# Patient Record
Sex: Female | Born: 1945 | Race: Black or African American | Hispanic: No | Smoking: Former smoker
Health system: Southern US, Community
[De-identification: ages and names within clinical notes are randomized; demographics above are authoritative.]

## PROBLEM LIST (undated history)

## (undated) DIAGNOSIS — R51 Headache: Secondary | ICD-10-CM

## (undated) DIAGNOSIS — N183 Chronic kidney disease, stage 3 unspecified: Secondary | ICD-10-CM

## (undated) DIAGNOSIS — M79671 Pain in right foot: Secondary | ICD-10-CM

## (undated) DIAGNOSIS — M1990 Unspecified osteoarthritis, unspecified site: Secondary | ICD-10-CM

## (undated) DIAGNOSIS — R0602 Shortness of breath: Secondary | ICD-10-CM

## (undated) DIAGNOSIS — Z87442 Personal history of urinary calculi: Secondary | ICD-10-CM

## (undated) DIAGNOSIS — IMO0002 Reserved for concepts with insufficient information to code with codable children: Secondary | ICD-10-CM

## (undated) DIAGNOSIS — I10 Essential (primary) hypertension: Secondary | ICD-10-CM

## (undated) DIAGNOSIS — I340 Nonrheumatic mitral (valve) insufficiency: Secondary | ICD-10-CM

## (undated) DIAGNOSIS — G4730 Sleep apnea, unspecified: Secondary | ICD-10-CM

## (undated) DIAGNOSIS — T8859XA Other complications of anesthesia, initial encounter: Secondary | ICD-10-CM

## (undated) DIAGNOSIS — K219 Gastro-esophageal reflux disease without esophagitis: Secondary | ICD-10-CM

## (undated) DIAGNOSIS — I639 Cerebral infarction, unspecified: Secondary | ICD-10-CM

## (undated) DIAGNOSIS — N390 Urinary tract infection, site not specified: Secondary | ICD-10-CM

## (undated) DIAGNOSIS — E663 Overweight: Secondary | ICD-10-CM

## (undated) DIAGNOSIS — E785 Hyperlipidemia, unspecified: Secondary | ICD-10-CM

## (undated) DIAGNOSIS — G501 Atypical facial pain: Secondary | ICD-10-CM

## (undated) DIAGNOSIS — I2510 Atherosclerotic heart disease of native coronary artery without angina pectoris: Secondary | ICD-10-CM

## (undated) DIAGNOSIS — T4145XA Adverse effect of unspecified anesthetic, initial encounter: Secondary | ICD-10-CM

## (undated) DIAGNOSIS — I739 Peripheral vascular disease, unspecified: Secondary | ICD-10-CM

## (undated) DIAGNOSIS — R9430 Abnormal result of cardiovascular function study, unspecified: Secondary | ICD-10-CM

## (undated) HISTORY — DX: Unspecified osteoarthritis, unspecified site: M19.90

## (undated) HISTORY — DX: Reserved for concepts with insufficient information to code with codable children: IMO0002

## (undated) HISTORY — DX: Atherosclerotic heart disease of native coronary artery without angina pectoris: I25.10

## (undated) HISTORY — DX: Atypical facial pain: G50.1

## (undated) HISTORY — DX: Headache: R51

## (undated) HISTORY — DX: Cerebral infarction, unspecified: I63.9

## (undated) HISTORY — DX: Nonrheumatic mitral (valve) insufficiency: I34.0

## (undated) HISTORY — DX: Sleep apnea, unspecified: G47.30

## (undated) HISTORY — DX: Essential (primary) hypertension: I10

## (undated) HISTORY — DX: Gastro-esophageal reflux disease without esophagitis: K21.9

## (undated) HISTORY — DX: Shortness of breath: R06.02

## (undated) HISTORY — PX: ABDOMINAL HYSTERECTOMY: SHX81

## (undated) HISTORY — DX: Abnormal result of cardiovascular function study, unspecified: R94.30

## (undated) HISTORY — DX: Overweight: E66.3

## (undated) HISTORY — PX: VESICOVAGINAL FISTULA CLOSURE W/ TAH: SUR271

## (undated) HISTORY — DX: Hyperlipidemia, unspecified: E78.5

## (undated) HISTORY — DX: Pain in right foot: M79.671

## (undated) HISTORY — DX: Peripheral vascular disease, unspecified: I73.9

---

## 1999-03-23 ENCOUNTER — Emergency Department (HOSPITAL_COMMUNITY): Admission: EM | Admit: 1999-03-23 | Discharge: 1999-03-23 | Payer: Self-pay | Admitting: Emergency Medicine

## 1999-09-23 ENCOUNTER — Encounter: Admission: RE | Admit: 1999-09-23 | Discharge: 1999-09-23 | Payer: Self-pay | Admitting: Family Medicine

## 1999-09-23 ENCOUNTER — Encounter: Payer: Self-pay | Admitting: Family Medicine

## 1999-10-05 ENCOUNTER — Encounter: Admission: RE | Admit: 1999-10-05 | Discharge: 1999-10-05 | Payer: Self-pay | Admitting: Family Medicine

## 1999-10-05 ENCOUNTER — Encounter: Payer: Self-pay | Admitting: Family Medicine

## 1999-10-17 ENCOUNTER — Emergency Department (HOSPITAL_COMMUNITY): Admission: EM | Admit: 1999-10-17 | Discharge: 1999-10-17 | Payer: Self-pay | Admitting: Emergency Medicine

## 1999-10-17 ENCOUNTER — Encounter: Payer: Self-pay | Admitting: Emergency Medicine

## 1999-10-19 ENCOUNTER — Encounter (INDEPENDENT_AMBULATORY_CARE_PROVIDER_SITE_OTHER): Payer: Self-pay | Admitting: *Deleted

## 1999-10-19 ENCOUNTER — Inpatient Hospital Stay (HOSPITAL_COMMUNITY): Admission: AD | Admit: 1999-10-19 | Discharge: 1999-10-21 | Payer: Self-pay | Admitting: Family Medicine

## 2000-11-17 ENCOUNTER — Encounter: Admission: RE | Admit: 2000-11-17 | Discharge: 2000-11-17 | Payer: Self-pay | Admitting: Family Medicine

## 2000-11-17 ENCOUNTER — Encounter: Payer: Self-pay | Admitting: Family Medicine

## 2002-02-08 ENCOUNTER — Emergency Department (HOSPITAL_COMMUNITY): Admission: EM | Admit: 2002-02-08 | Discharge: 2002-02-08 | Payer: Self-pay | Admitting: Emergency Medicine

## 2002-02-21 ENCOUNTER — Emergency Department (HOSPITAL_COMMUNITY): Admission: EM | Admit: 2002-02-21 | Discharge: 2002-02-21 | Payer: Self-pay | Admitting: Emergency Medicine

## 2002-02-21 ENCOUNTER — Encounter: Payer: Self-pay | Admitting: Emergency Medicine

## 2003-02-16 ENCOUNTER — Encounter: Payer: Self-pay | Admitting: Emergency Medicine

## 2003-02-16 ENCOUNTER — Inpatient Hospital Stay (HOSPITAL_COMMUNITY): Admission: EM | Admit: 2003-02-16 | Discharge: 2003-02-19 | Payer: Self-pay | Admitting: Emergency Medicine

## 2003-02-17 ENCOUNTER — Encounter: Payer: Self-pay | Admitting: Family Medicine

## 2003-02-18 ENCOUNTER — Encounter: Payer: Self-pay | Admitting: Cardiology

## 2003-02-19 ENCOUNTER — Encounter: Payer: Self-pay | Admitting: Cardiology

## 2003-02-22 ENCOUNTER — Encounter: Admission: RE | Admit: 2003-02-22 | Discharge: 2003-02-22 | Payer: Self-pay | Admitting: Family Medicine

## 2003-06-21 ENCOUNTER — Emergency Department (HOSPITAL_COMMUNITY): Admission: EM | Admit: 2003-06-21 | Discharge: 2003-06-21 | Payer: Self-pay | Admitting: *Deleted

## 2003-06-24 ENCOUNTER — Encounter: Admission: RE | Admit: 2003-06-24 | Discharge: 2003-06-24 | Payer: Self-pay | Admitting: Family Medicine

## 2004-01-06 ENCOUNTER — Inpatient Hospital Stay (HOSPITAL_COMMUNITY): Admission: EM | Admit: 2004-01-06 | Discharge: 2004-01-11 | Payer: Self-pay | Admitting: Family Medicine

## 2004-01-07 ENCOUNTER — Encounter: Payer: Self-pay | Admitting: Cardiology

## 2004-02-21 ENCOUNTER — Ambulatory Visit (HOSPITAL_BASED_OUTPATIENT_CLINIC_OR_DEPARTMENT_OTHER): Admission: RE | Admit: 2004-02-21 | Discharge: 2004-02-21 | Payer: Self-pay | Admitting: Cardiology

## 2004-04-01 ENCOUNTER — Inpatient Hospital Stay (HOSPITAL_COMMUNITY): Admission: EM | Admit: 2004-04-01 | Discharge: 2004-04-03 | Payer: Self-pay | Admitting: Emergency Medicine

## 2005-03-03 ENCOUNTER — Ambulatory Visit: Payer: Self-pay | Admitting: Cardiology

## 2005-03-03 ENCOUNTER — Observation Stay (HOSPITAL_COMMUNITY): Admission: EM | Admit: 2005-03-03 | Discharge: 2005-03-06 | Payer: Self-pay | Admitting: Emergency Medicine

## 2005-04-09 ENCOUNTER — Ambulatory Visit: Payer: Self-pay | Admitting: Cardiology

## 2005-10-26 ENCOUNTER — Encounter: Admission: RE | Admit: 2005-10-26 | Discharge: 2005-10-26 | Payer: Self-pay | Admitting: Family Medicine

## 2005-12-10 ENCOUNTER — Ambulatory Visit: Payer: Self-pay | Admitting: Cardiology

## 2005-12-23 ENCOUNTER — Ambulatory Visit: Payer: Self-pay | Admitting: Cardiology

## 2005-12-23 ENCOUNTER — Ambulatory Visit: Payer: Self-pay

## 2006-01-14 ENCOUNTER — Inpatient Hospital Stay (HOSPITAL_COMMUNITY): Admission: EM | Admit: 2006-01-14 | Discharge: 2006-01-17 | Payer: Self-pay | Admitting: Emergency Medicine

## 2006-01-14 ENCOUNTER — Ambulatory Visit: Payer: Self-pay | Admitting: Cardiology

## 2007-03-01 ENCOUNTER — Ambulatory Visit: Payer: Self-pay | Admitting: Vascular Surgery

## 2007-03-24 ENCOUNTER — Ambulatory Visit (HOSPITAL_COMMUNITY): Admission: RE | Admit: 2007-03-24 | Discharge: 2007-03-24 | Payer: Self-pay | Admitting: Vascular Surgery

## 2007-03-24 ENCOUNTER — Ambulatory Visit: Payer: Self-pay | Admitting: Vascular Surgery

## 2007-04-12 ENCOUNTER — Ambulatory Visit: Payer: Self-pay | Admitting: Vascular Surgery

## 2007-06-09 ENCOUNTER — Encounter: Admission: RE | Admit: 2007-06-09 | Discharge: 2007-06-09 | Payer: Self-pay | Admitting: Occupational Medicine

## 2007-07-12 ENCOUNTER — Ambulatory Visit: Payer: Self-pay | Admitting: Vascular Surgery

## 2008-08-21 ENCOUNTER — Ambulatory Visit: Payer: Self-pay | Admitting: Vascular Surgery

## 2008-11-20 ENCOUNTER — Ambulatory Visit: Payer: Self-pay | Admitting: Vascular Surgery

## 2009-02-05 ENCOUNTER — Ambulatory Visit: Payer: Self-pay | Admitting: Vascular Surgery

## 2009-05-06 ENCOUNTER — Encounter: Payer: Self-pay | Admitting: Cardiology

## 2009-05-06 DIAGNOSIS — E663 Overweight: Secondary | ICD-10-CM

## 2009-05-06 DIAGNOSIS — E119 Type 2 diabetes mellitus without complications: Secondary | ICD-10-CM

## 2009-05-06 DIAGNOSIS — I10 Essential (primary) hypertension: Secondary | ICD-10-CM | POA: Insufficient documentation

## 2009-05-06 DIAGNOSIS — E785 Hyperlipidemia, unspecified: Secondary | ICD-10-CM | POA: Insufficient documentation

## 2009-05-07 ENCOUNTER — Ambulatory Visit: Payer: Self-pay | Admitting: Cardiology

## 2009-05-07 DIAGNOSIS — R0602 Shortness of breath: Secondary | ICD-10-CM | POA: Insufficient documentation

## 2009-05-16 ENCOUNTER — Ambulatory Visit: Payer: Self-pay

## 2009-05-16 ENCOUNTER — Ambulatory Visit: Payer: Self-pay | Admitting: Internal Medicine

## 2009-05-16 ENCOUNTER — Ambulatory Visit (HOSPITAL_COMMUNITY): Admission: RE | Admit: 2009-05-16 | Discharge: 2009-05-16 | Payer: Self-pay | Admitting: Internal Medicine

## 2009-05-16 ENCOUNTER — Encounter: Payer: Self-pay | Admitting: Cardiology

## 2009-05-19 ENCOUNTER — Ambulatory Visit: Payer: Self-pay | Admitting: Cardiology

## 2009-05-27 ENCOUNTER — Ambulatory Visit: Payer: Self-pay | Admitting: Cardiology

## 2009-05-29 LAB — CONVERTED CEMR LAB
BUN: 16 mg/dL (ref 6–23)
CO2: 31 meq/L (ref 19–32)
Chloride: 100 meq/L (ref 96–112)
Creatinine, Ser: 0.8 mg/dL (ref 0.4–1.2)
Glucose, Bld: 86 mg/dL (ref 70–99)
Potassium: 3.3 meq/L — ABNORMAL LOW (ref 3.5–5.1)

## 2009-06-04 ENCOUNTER — Ambulatory Visit: Payer: Self-pay | Admitting: Cardiology

## 2009-06-04 DIAGNOSIS — E8770 Fluid overload, unspecified: Secondary | ICD-10-CM | POA: Insufficient documentation

## 2009-06-04 DIAGNOSIS — E876 Hypokalemia: Secondary | ICD-10-CM

## 2009-07-08 ENCOUNTER — Ambulatory Visit: Payer: Self-pay | Admitting: Cardiology

## 2009-07-21 ENCOUNTER — Telehealth (INDEPENDENT_AMBULATORY_CARE_PROVIDER_SITE_OTHER): Payer: Self-pay | Admitting: *Deleted

## 2009-07-22 ENCOUNTER — Encounter (HOSPITAL_COMMUNITY): Admission: RE | Admit: 2009-07-22 | Discharge: 2009-09-29 | Payer: Self-pay | Admitting: Cardiology

## 2009-07-22 ENCOUNTER — Ambulatory Visit: Payer: Self-pay

## 2009-07-22 ENCOUNTER — Ambulatory Visit: Payer: Self-pay | Admitting: Cardiovascular Disease

## 2009-07-30 ENCOUNTER — Ambulatory Visit: Payer: Self-pay | Admitting: Cardiology

## 2009-10-22 ENCOUNTER — Ambulatory Visit: Payer: Self-pay | Admitting: Vascular Surgery

## 2009-10-28 ENCOUNTER — Ambulatory Visit (HOSPITAL_COMMUNITY): Admission: RE | Admit: 2009-10-28 | Discharge: 2009-10-28 | Payer: Self-pay | Admitting: Obstetrics & Gynecology

## 2009-12-10 ENCOUNTER — Encounter (INDEPENDENT_AMBULATORY_CARE_PROVIDER_SITE_OTHER): Payer: Self-pay | Admitting: *Deleted

## 2010-02-04 ENCOUNTER — Ambulatory Visit: Payer: Self-pay | Admitting: Cardiology

## 2010-04-23 ENCOUNTER — Ambulatory Visit: Payer: Self-pay | Admitting: Vascular Surgery

## 2010-08-25 NOTE — Assessment & Plan Note (Signed)
Summary: 3wk f.u myoview 07-22-09   Visit Type:  Follow-up Referring Provider:  Fabienne Bruns, MD Primary Provider:  Abbe Amsterdam  CC:  shortness of breath.  History of Present Illness: The patient is seen for follow up of shortness of breath.  I saw her last July 08, 2009.  An exercise nuclear scan was scheduled at that time.  Patient is feeling better in general.  Current Medications (verified): 1)  Actoplus Met 15-850 Mg Tabs (Pioglitazone Hcl-Metformin Hcl) .... Two Times A Day 2)  Lipitor 40 Mg Tabs (Atorvastatin Calcium) .... Take One Tablet By Mouth Daily. 3)  Metoprolol Tartrate 100 Mg Tabs (Metoprolol Tartrate) .... Take One Tablet By Mouth Twice A Day 4)  Aspirin Ec 325 Mg Tbec (Aspirin) .... Take One Tablet By Mouth Daily 5)  Lisinopril 40 Mg Tabs (Lisinopril) .... Take One Tablet By Mouth Daily 6)  Nitrostat 0.4 Mg Subl (Nitroglycerin) .Marland Kitchen.. 1 Tablet Under Tongue At Onset of Chest Pain; You May Repeat Every 5 Minutes For Up To 3 Doses. 7)  Citalopram Hydrobromide 20 Mg Tabs (Citalopram Hydrobromide) .... Take One Tablet By Mouth Once Daily. 8)  Meloxicam 15 Mg Tabs (Meloxicam) .... Take One Tablet By Mouth Once Daily. 9)  Furosemide 40 Mg Tabs (Furosemide) .... Take One Tablet By Mouth Two Times A Day 10)  Potassium Chloride Crys Cr 20 Meq Cr-Tabs (Potassium Chloride Crys Cr) .... Take 2 Tabs By Mouth Daily  Allergies (verified): No Known Drug Allergies  Review of Systems       The patient denies fever, chills, headache, sweats, rash, change in vision, change in hearing, nausea vomiting, urinary symptoms.  All the systems are reviewed and are negative.  Vital Signs:  Patient profile:   65 year old female Height:      62.5 inches Weight:      226 pounds BMI:     40.82 Pulse rate:   75 / minute BP sitting:   142 / 90  (left arm) Cuff size:   large  Vitals Entered By: Hardin Negus, RMA (July 30, 2009 9:07 AM)  Physical Exam  General:  patient is  stable today. Eyes:  no xanthelasma. Neck:  no jugular venous distention. Lungs:  lungs are clear.  Respiratory effort is nonlabored. Heart:  cardiac exam reveals S1 and S2.  No clicks or significant murmurs. Abdomen:  abdomen is soft. Extremities:  no peripheral edema. Psych:  patient is oriented to person time and place.  Affect is normal.   Impression & Recommendations:  Problem # 1:  FLUID OVERLOAD (ICD-276.6) Volume status is stable.  No change.  Problem # 2:  SHORTNESS OF BREATH (ICD-786.05)  Her updated medication list for this problem includes:    Metoprolol Tartrate 100 Mg Tabs (Metoprolol tartrate) .Marland Kitchen... Take one tablet by mouth twice a day    Aspirin Ec 325 Mg Tbec (Aspirin) .Marland Kitchen... Take one tablet by mouth daily    Lisinopril 40 Mg Tabs (Lisinopril) .Marland Kitchen... Take one tablet by mouth daily    Furosemide 40 Mg Tabs (Furosemide) .Marland Kitchen... Take one tablet by mouth two times a day shortness of breath is stable.  She does not have ischemia.  No further workup.  Problem # 3:  CAD (ICD-414.00)  Her updated medication list for this problem includes:    Metoprolol Tartrate 100 Mg Tabs (Metoprolol tartrate) .Marland Kitchen... Take one tablet by mouth twice a day    Aspirin Ec 325 Mg Tbec (Aspirin) .Marland Kitchen... Take one tablet by mouth  daily    Lisinopril 40 Mg Tabs (Lisinopril) .Marland Kitchen... Take one tablet by mouth daily    Nitrostat 0.4 Mg Subl (Nitroglycerin) .Marland Kitchen... 1 tablet under tongue at onset of chest pain; you may repeat every 5 minutes for up to 3 doses. Coronary disease is stable.  No further workup.  Nuclear scan was done.  This study shows no ischemia I have reviewed completely and ejection fraction was 70%.  We'll see her back in 6 months for followup.  Patient Instructions: 1)  Follow up in 6 months

## 2010-08-25 NOTE — Letter (Signed)
Summary: Appointment - Reminder 2  [facility], Main Office  [address]   [place], [place] [address]   Phone: [phone]  Fax: [phone]     Dec 10, 2009 MRN: [id number]   [facility] [address] [place], [place]  [address]   Dear [name],  Our records indicate that it is time to schedule a follow-up appointment with Dr.Katz. It is very important that we reach you to schedule this appointment. We look forward to participating in your health care needs. Please contact us at the number listed above at your earliest convenience to schedule your appointment.  If you are unable to make an appointment at this time, give [place] a call so we can update our records.     Sincerely,   Migdalia Dk [facility] Scheduling Team

## 2010-08-25 NOTE — Assessment & Plan Note (Signed)
Summary: f63m   Visit Type:  Follow-up Referring Provider:  Fabienne Bruns, MD Primary Provider:  Warrick Parisian, MD  CC:  shortness of breath.  History of Present Illness: The patient is seen in followup shortness of breath.  On diuretic she's been quite stable.  She does have good LV function.  There is mild coronary disease by history.  She does have peripheral vascular disease that is stable.  Overall she is doing well.  Current Medications (verified): 1)  Actoplus Met 15-850 Mg Tabs (Pioglitazone Hcl-Metformin Hcl) .... Two Times A Day 2)  Lipitor 40 Mg Tabs (Atorvastatin Calcium) .... Take One Tablet By Mouth Daily. 3)  Metoprolol Tartrate 100 Mg Tabs (Metoprolol Tartrate) .... Take One Tablet By Mouth Twice A Day 4)  Aspirin Ec 325 Mg Tbec (Aspirin) .... Take One Tablet By Mouth Daily 5)  Lisinopril 40 Mg Tabs (Lisinopril) .... Take One Tablet By Mouth Daily 6)  Nitrostat 0.4 Mg Subl (Nitroglycerin) .Marland Kitchen.. 1 Tablet Under Tongue At Onset of Chest Pain; You May Repeat Every 5 Minutes For Up To 3 Doses. 7)  Citalopram Hydrobromide 20 Mg Tabs (Citalopram Hydrobromide) .... Take One Tablet By Mouth Once Daily. 8)  Meloxicam 15 Mg Tabs (Meloxicam) .... Take One Tablet By Mouth Once Daily. 9)  Furosemide 40 Mg Tabs (Furosemide) .... Take One Tablet By Mouth Two Times A Day 10)  Potassium Chloride Crys Cr 20 Meq Cr-Tabs (Potassium Chloride Crys Cr) .... Take 1 Tab By Mouth Daily  Allergies (verified): No Known Drug Allergies  Past History:  Past Medical History: Urinary Incontenance Hypertension Diabetes Dyslipidemia Sleep Apnea...CPap cpmpliance  ?? Overweight PAD.. Dr Darrick Penna....last office visit 07/2008.Marland KitchenMarland KitchenBilateral SFA occlusions with tibial disease...claudication.Marland Kitchendecreased ABIs.Marland Kitchen but medical Rx best for now  CAD.. cath..02/2005...mild two vessel nonobstructive disease EF  55%...echo..12/2003... / echo May 16, 2009  mild LVH.  EF 60%.. mild MR Shortness of breath  Review of  Systems       Patient denies fever, chills, headache, sweats, rash, change in vision, change in hearing, chest pain, cough, nausea vomiting, urinary symptoms.  She is sleeping better now that she uses Ambien along with her CPAP.  All other systems are reviewed and are negative.  Vital Signs:  Patient profile:   65 year old female Height:      62.5 inches Weight:      226 pounds BMI:     40.82 Pulse rate:   65 / minute BP sitting:   132 / 80  (left arm) Cuff size:   regular  Vitals Entered By: Hardin Negus, RMA (February 04, 2010 10:55 AM)  Physical Exam  General:  patient is overweight but stable. Eyes:  no xanthelasma. Neck:  no jugular venous tension. Lungs:  lungs are clear respiratory effort is nonlabored. Heart:  cardiac exam reveals S1-S2.  No clicks or significant murmurs. Abdomen:  abdomen is soft. Extremities:  no peripheral edema. Psych:  patient is oriented to person time and place.  Affect is normal.   Impression & Recommendations:  Problem # 1:  FLUID OVERLOAD (ICD-276.6) Volume  status is stable.  No change in therapy.  Problem # 2:  SHORTNESS OF BREATH (ICD-786.05)  Her updated medication list for this problem includes:    Metoprolol Tartrate 100 Mg Tabs (Metoprolol tartrate) .Marland Kitchen... Take one tablet by mouth twice a day    Aspirin Ec 325 Mg Tbec (Aspirin) .Marland Kitchen... Take one tablet by mouth daily    Lisinopril 40 Mg Tabs (Lisinopril) .Marland Kitchen... Take one  tablet by mouth daily    Furosemide 40 Mg Tabs (Furosemide) .Marland Kitchen... Take one tablet by mouth two times a day The patient is not having any significant shortness of breath.  Problem # 3:  CAD (ICD-414.00)  Her updated medication list for this problem includes:    Metoprolol Tartrate 100 Mg Tabs (Metoprolol tartrate) .Marland Kitchen... Take one tablet by mouth twice a day    Aspirin Ec 325 Mg Tbec (Aspirin) .Marland Kitchen... Take one tablet by mouth daily    Lisinopril 40 Mg Tabs (Lisinopril) .Marland Kitchen... Take one tablet by mouth daily    Nitrostat 0.4  Mg Subl (Nitroglycerin) .Marland Kitchen... 1 tablet under tongue at onset of chest pain; you may repeat every 5 minutes for up to 3 doses. Coronary disease mild by history.  No change in therapy.  Problem # 4:  SLEEP APNEA (ICD-780.57) The patient is more successful with her CPAP now she uses Ambien and does well.  Problem # 5:  HYPERTENSION (ICD-401.9)  Her updated medication list for this problem includes:    Metoprolol Tartrate 100 Mg Tabs (Metoprolol tartrate) .Marland Kitchen... Take one tablet by mouth twice a day    Aspirin Ec 325 Mg Tbec (Aspirin) .Marland Kitchen... Take one tablet by mouth daily    Lisinopril 40 Mg Tabs (Lisinopril) .Marland Kitchen... Take one tablet by mouth daily    Furosemide 40 Mg Tabs (Furosemide) .Marland Kitchen... Take one tablet by mouth two times a day Blood pressures under good control.  No change in therapy.  Patient Instructions: 1)  Your physician wants you to follow-up in:  1 year.  You will receive a reminder letter in the mail two months in advance. If you don't receive a letter, please call our office to schedule the follow-up appointment.

## 2010-10-08 ENCOUNTER — Emergency Department (HOSPITAL_COMMUNITY)
Admission: EM | Admit: 2010-10-08 | Discharge: 2010-10-08 | Disposition: A | Discharge status: Home / Self Care | Payer: Medicare Other | Attending: Emergency Medicine | Admitting: Emergency Medicine

## 2010-10-08 DIAGNOSIS — X58XXXA Exposure to other specified factors, initial encounter: Secondary | ICD-10-CM | POA: Insufficient documentation

## 2010-10-08 DIAGNOSIS — I2510 Atherosclerotic heart disease of native coronary artery without angina pectoris: Secondary | ICD-10-CM | POA: Insufficient documentation

## 2010-10-08 DIAGNOSIS — T148XXA Other injury of unspecified body region, initial encounter: Secondary | ICD-10-CM | POA: Insufficient documentation

## 2010-10-08 DIAGNOSIS — I10 Essential (primary) hypertension: Secondary | ICD-10-CM | POA: Insufficient documentation

## 2010-10-08 DIAGNOSIS — M549 Dorsalgia, unspecified: Secondary | ICD-10-CM | POA: Insufficient documentation

## 2010-10-08 DIAGNOSIS — M25519 Pain in unspecified shoulder: Secondary | ICD-10-CM | POA: Insufficient documentation

## 2010-10-08 DIAGNOSIS — E119 Type 2 diabetes mellitus without complications: Secondary | ICD-10-CM | POA: Insufficient documentation

## 2010-10-08 LAB — DIFFERENTIAL
Basophils Absolute: 0 10*3/uL (ref 0.0–0.1)
Basophils Relative: 0 % (ref 0–1)
Eosinophils Absolute: 0.1 10*3/uL (ref 0.0–0.7)
Eosinophils Relative: 1 % (ref 0–5)
Lymphocytes Relative: 22 % (ref 12–46)
Lymphs Abs: 1.3 10*3/uL (ref 0.7–4.0)
Monocytes Relative: 7 % (ref 3–12)
Neutro Abs: 4.2 10*3/uL (ref 1.7–7.7)

## 2010-10-08 LAB — CBC
HCT: 36.5 % (ref 36.0–46.0)
MCH: 28.3 pg (ref 26.0–34.0)
MCV: 86.1 fL (ref 78.0–100.0)

## 2010-10-08 LAB — BASIC METABOLIC PANEL
Calcium: 9.1 mg/dL (ref 8.4–10.5)
Chloride: 104 mEq/L (ref 96–112)
GFR calc non Af Amer: >60 mL/min (ref >60)
Potassium: 3.3 mEq/L — ABNORMAL LOW (ref 3.5–5.1)
Sodium: 139 mEq/L (ref 135–145)

## 2010-10-08 LAB — POCT CARDIAC MARKERS: CKMB, poc: 1.1 ng/mL (ref 1.0–8.0)

## 2010-10-14 LAB — BASIC METABOLIC PANEL
CO2: 28 mEq/L (ref 19–32)
Calcium: 8.8 mg/dL (ref 8.4–10.5)
GFR calc non Af Amer: >60 mL/min (ref >60)
Potassium: 2.8 mEq/L — ABNORMAL LOW (ref 3.5–5.1)

## 2010-10-14 LAB — CBC
HCT: 35.1 % — ABNORMAL LOW (ref 36.0–46.0)
Hemoglobin: 11.6 g/dL — ABNORMAL LOW (ref 12.0–15.0)
MCV: 87.5 fL (ref 78.0–100.0)
Platelets: 244 10*3/uL (ref 150–400)
WBC: 5 10*3/uL (ref 4.0–10.5)

## 2010-10-14 LAB — GLUCOSE, CAPILLARY: Glucose-Capillary: 114 mg/dL — ABNORMAL HIGH (ref 70–99)

## 2010-10-14 LAB — POTASSIUM: Potassium: 4.1 mEq/L (ref 3.5–5.1)

## 2010-12-08 NOTE — Procedures (Signed)
DUPLEX DEEP VENOUS EXAM - LOWER EXTREMITY   INDICATION:  Legs breaking out and feet swelling.   HISTORY:  Edema:  Feet swelling.  Trauma/Surgery:  No.  Pain:  Bilateral lower extremity.  PE:  No.  Previous DVT:  No.  Anticoagulants:  Other:   DUPLEX EXAM:                CFV   SFV   PopV   PTV   GSV                R  L  R  L  R  L   R  L  R  L  Thrombosis    0  0  0  0  0  0   0  0  0  0  Spontaneous   +  +  +  +  +  +   +  +  +  +  Phasic        +  +  +  +  +  +   +  +  +  +  Augmentation  +  +  +  +  +  +   +  +  +  +  Compressible  +  +  +  +  +  +   +  +  +  +  Competent     D  D  +  +  +  +   +  +  +  +   Legend:  + - yes  o - no  p - partial  D - decreased   IMPRESSION:  1. No evidence of DVT or SVT in the bilateral lower extremities.  2. Evidence of mild venous insufficiency in bilateral common femoral      veins.    _____________________________  Janetta Hora Fields, MD   AS/MEDQ  D:  11/20/2008  T:  11/20/2008  Job:  [id number]

## 2010-12-08 NOTE — Assessment & Plan Note (Signed)
OFFICE VISIT   METTIE, ROYLANCE  DOB:  01/21/1946                                       04/12/2007  [id number]#:[id number]   NOTE:  Ms. Fabre returns for followup today after her arteriogram on  August 29.  This showed bilateral superficial femoral artery occlusion  with reconstitution of her popliteal artery and single vessel runoff via  the peroneal in both lower extremities.  She has fairly severe  atherosclerotic occlusive disease bilaterally.  However, her ABIs are in  the 0.6 range, so I do not believe she is at imminent risk of limb loss.  Her primary symptoms currently are short-distance claudication.  She  also has some symptoms of burning in both lower extremities, which  sounds more like neuropathy.  I believe the best option for her is  conservative management with a walking program of 30 minutes daily and  strict control of her atherosclerotic risk factors.  I counseled her  today on treating her diabetes as well as continuing to refrain from  smoking.  If she develops ulcerations on the feet or develops  progressive deterioration of her ABIs, then we would consider a bypass  at that point.  She will follow up in three months' time for repeat  ABIs.  I also counseled her today in foot care.  She has her nails done  occasionally on her feet, and I told her that if she develops any  blisters, ulcerations or sores she should come back to see me as soon as  possible.   Janetta Hora. Fields, MD  Electronically Signed   CEF/MEDQ  D:  04/12/2007  T:  04/13/2007  Job:  [id number]   cc:   Rema Fendt, PA

## 2010-12-08 NOTE — Assessment & Plan Note (Signed)
OFFICE VISIT   SEERAT, PEADEN  DOB:  01-Mar-1946                                       07/12/2007  ZOXWR#:[id number]   The patient returns for further followup today for bilateral lower  extremity claudication symptoms.  She was last seen September 2008.  She  has primary complaint of short-distance claudication symptoms at that  time.  She tried to start a walking program, but stated that her walking  distance was so short that she really has not been compliant with this.  She continues to refrain from smoking.  She states that she changed her  job from second shift to third shift due to her decreased ability to  ambulate.  She states that this has worked better for her.  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  Currently, she experiences claudication symptoms in  both legs with the right being worse than the left.  This occurs at  approximately 1/2 to 3/4 of the walk.  She denies rest pain.  She has  had no problems with non-healing wounds on the feet.   PHYSICAL EXAM:  Blood pressure 183/87, pulse 83 and regular.  She has 2+  femoral pulses with absent popliteal and pedal pulses bilaterally.  Feet  have no ulcerations.  Toes are slightly dusky bilaterally.  Abdomen is  obese with no masses.  Normal bowel sounds.   She had bilateral ABI performed today, which were 0.57 on the right and  0.55 on the left.   The patient has fairly debilitating claudication symptoms.  Her primary  atherosclerotic risk factors include previous smoking and diabetes.  She  also is obese.  She also has a history of hypertension.  She states that  she has lost some weight recently and has been on less metformin  secondary to that.  I counseled her again today on risk factor  modification.  I also advised her that we could consider doing a right  femoral to below knee popliteal bypass with vein.  However, I also  counseled her that the bypass graft would have a 5-year patency of 75%  and her circulation problems could be worse if that bypass occludes over  time.   Currently, my preference would be to continue conservative management  with risk factor modification and again continue to try an exercise  program for her.  However, she seems to have fairly debilitating  lifestyle changes due to her claudication symptoms.  She is going to  consider whether or not she wants to have a fem-pop bypass done for  these symptoms.  I did advise her also that she currently is still not  at eminent risk of limb loss, and her lifetime risk of limb loss most  likely would be 5% or less without intervention.  She will consider  whether or not she wishes to have revascularization option for lifestyle  limiting claudication.  She will follow up with me in 6 months' time if  she does not wish operation.  If she wishes to have something done, then  she will call me back sooner.   Janetta Hora. Fields, MD  Electronically Signed   CEF/MEDQ  D:  07/12/2007  T:  07/13/2007  Job:  [id number]   cc:  Rema Fendt, [place]

## 2010-12-08 NOTE — Procedures (Signed)
LOWER EXTREMITY ARTERIAL EVALUATION-SINGLE LEVEL   INDICATION:  Followup evaluation of lower extremity PVD, rest pain and  claudication right worse than left.   HISTORY:  Diabetes:  Yes.  Cardiac:  Yes.  Hypertension:  Yes.  Smoking:  Former smoker.  Previous Surgery:  Previous angiography on 03/24/2007 revealed bilateral  internal iliac artery occlusion, occluded right and left superficial  femoral arteries, tibial vessel disease and one vessel runoff via the  peroneal arteries bilaterally.   RESTING SYSTOLIC PRESSURES: (ABI)                          RIGHT                LEFT  Brachial:               174                  174  Anterior tibial:        100                  100  Posterior tibial:       120 (0.69)           100 (0.57)  Peroneal:  DOPPLER WAVEFORM ANALYSIS:  Anterior tibial:        Monophasic           Monophasic  Posterior tibial:       Monophasic           Monophasic  Peroneal:   PREVIOUS ABI'S:  Date:  07/12/2007  RIGHT:  0.57  LEFT:  0.55   IMPRESSION:  ABIs are stable compared to previous study and suggest  moderate to severe occlusive disease in the lower extremities  bilaterally.   ___________________________________________  Janetta Hora Fields, MD   MC/MEDQ  D:  08/21/2008  T:  08/21/2008  Job:  [id number]

## 2010-12-08 NOTE — Op Note (Signed)
[name], [name]                ACCOUNT NO.:  [id number]   MEDICAL RECORD NO.:  [id number]          PATIENT TYPE:  AMB   LOCATION:  SDS                          FACILITY:  [facility]   PHYSICIAN:  Janetta Hora. Fields, MD  DATE OF BIRTH:  06-03-1946   DATE OF PROCEDURE:  03/24/2007  DATE OF DISCHARGE:                               OPERATIVE REPORT   PROCEDURE:  Aortogram with bilateral lower extremity runoff.   PREOPERATIVE DIAGNOSIS:  Bilateral lower extremity claudication.   POSTOPERATIVE DIAGNOSIS:  Bilateral lower extremity claudication.   ANESTHESIA:  Local.   DESCRIPTION OF PROCEDURE:  After obtaining informed consent, the patient  was taken to the PV lab.  The patient was placed in the supine position  on the angio table.  Next, both groins were prepped and draped in the  usual sterile fashion.  Local anesthesia was infiltrated over the right  common femoral artery.  The Majestic needle was used to cannulate the  right common femoral artery.  Due to the patient's large pannus, several  sticks were performed before the artery was successfully cannulated.  Next, a 0.035 J-tip guidewire was threaded into the abdominal aorta  under fluoroscopic guidance.  Needle was removed and 5-French sheath  placed over the guidewire in the right common femoral artery.  A 5-  French pigtail catheter was then placed over the guidewire and abdominal  aortogram obtained.  Left and right renal arteries were widely patent.  The abdominal aorta and common iliac arteries have minimal  atherosclerotic changes.  The left internal iliac artery is occluded.  The right internal iliac artery also appears occluded.   Next, the pigtail catheter was pulled back over a guidewire and a 5-  French end-hole catheter brought up on the operative field.  This was  used to selectively catheterize the left common iliac artery.  A 0.035  Wholey wire was then advanced down to the left common femoral artery in  a  crossover catheter and exchanged for a 5-French end-hole catheter.  Left lower extremity runoff view was obtained.  This shows a widely  patent left common femoral artery.  The left profunda femoris artery is  patent.  The left superficial femoral artery occludes just distal to its  takeoff.  Profunda collaterals reconstitute the popliteal artery.  This  is diffusely diseased above the knee.  Popliteal artery below the knee  is less diseased and patent.  There is severe disease of the proximal  portions of the tibial vessels.  The anterior tibial artery and  posterior tibial artery are occluded throughout their course.  The  peroneal artery is the only  runoff vessel to the foot and this is  patent all the way to the level of the ankle and gives off collaterals  to the foot.   Next, the end-hole catheter was removed and a right lower extremity  arteriogram was performed through the 5-French sheath.  This showed a  similar pattern with wide patency of the common femoral and profunda  femoris arteries, but diffusely diseased and distally occluded right  superficial femoral artery.  Popliteal artery again reconstitutes via  profunda collaterals.  The below-knee popliteal artery has minimal  disease and is patent.  Again there is diffuse disease at the origin of  the tibial vessels.  The only tibial vessel that is patent in the distal  leg is the peroneal artery.  The anterior and posterior tibial arteries  are occluded.  Peroneal artery is only vessel with runoff to the foot.   Next, the patient was taken to the holding area with the sheath left in  place to be pulled there.  The patient tolerated the procedure well and  there were no complications.   OPERATIVE FINDINGS:  1. 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  2. Bilateral superficial femoral artery occlusions.  3. No significant aortoiliac occlusive disease.      Janetta Hora.  Fields, MD  Electronically Signed     CEF/MEDQ  D:  03/24/2007  T:  03/25/2007  Job:  [id number]

## 2010-12-08 NOTE — Assessment & Plan Note (Signed)
OFFICE VISIT   Tammy, [name]  DOB:  16-Jun-1946                                       02/05/2009  ZOXWR#:[id number]   The patient returns for followup today.  We have been following her for  lower extremity claudication symptoms.  She was last seen in January  2010.  She still describes a burning sensation in her foot and calf.  She was previously prescribed compression stockings and this has helped  somewhat with the swelling and achiness in her legs.  She states that  the burning in her right leg has gotten worse in the last 6 months.  She  does not have completely debilitating symptoms from her legs at this  point.  Atherosclerotic risk factors include hypertension, coronary  artery disease and diabetes.  She has refrained from smoking.  She has  had no medication changes since 2008.   PHYSICAL EXAMINATION:  Blood pressure 166/94 in the left arm, pulse 57  and regular.  Lower extremities:  She has 102+ femoral pulses  bilaterally with absent popliteal and pedal pulses bilaterally.  Feet  are pink, warm and well-perfused.  She has a macular type rash in both  thighs with lesions 3-5 mm in size.  These are nonblanching.  There is  no open ulceration.  She has areas of scarring in her lower extremities  which she had previously from a rash of similar type.  She has no  ulcerations on the feet.   She had bilateral ABIs performed today which were 0.57 on the left and  0.63 on the right.  These are not significantly changed from the  previous ABIs.   As far as the patient's claudication is concerned, her symptoms I  believe are overall stable and not very debilitating.  I believe the  best option for her is continued risk factor modification.  As far as  her rash is concerned, I believe the best option would be to have a  dermatologist evaluate this.  We have scheduled her for an appointment  in the near future regarding this.  She will follow up with me  in 6  months' time for repeat ABIs.   Janetta Hora. Fields, MD  Electronically Signed   CEF/MEDQ  D:  02/05/2009  T:  02/06/2009  Job:  [id number]   cc:   Rema Fendt, P.A.

## 2010-12-08 NOTE — Assessment & Plan Note (Signed)
OFFICE VISIT   VAANI, MORREN  DOB:  1946-05-07                                       08/21/2008  JXBJY#:[id number]   The patient returns for followup today.  She was last seen in December  of 2008.  We have been following her for bilateral lower extremity  claudication symptoms.  She continues to refrain from smoking.  She  still has claudication symptoms that occur at approximately one block.  The pain is worse in the right leg.  She has some difficulty at work but  states that switching to third shift has allowed her to sit more often  and has overall improved her status at work.  She does not feel that she  is completely limited by her claudication symptoms currently.   She has noticed a red splotchy rash on the medial aspect of both lower  extremities that has developed recently.  This is on both legs.   Her other atherosclerotic risk factors include hypertension, coronary  artery disease and diabetes.   ALLERGIES:  She has no known drug allergies.   PHYSICAL EXAMINATION:  Vital signs:  On physical exam blood pressure is  146/80 in the right arm and pulse is 58 and regular.  HEENT:  Unremarkable.  Neck:  Has 2+ carotid pulses without bruit.  Chest:  Clear to auscultation.  Cardiac:  Exam is regular rate and rhythm.  Abdomen:  Is soft, nontender, nondistended.  She has a 1 - 2+ femoral  pulse bilaterally.  She has no popliteal or pedal pulses bilaterally.  Feet are pink, warm and well-perfused bilaterally.  She has a petechial  type rash on the medial aspect of both lower extremities which is very  consistent with venous stasis type changes.   She had repeat ABIs today which were 0.57 on the left and 0.69 on the  right.  These are fairly similar to her ABIs in 2008.   The patient's claudication symptoms overall are similar over the past  year.  I do not believe she needs any intervention at this point.  Her  previous arteriogram showed bilateral  SFA occlusions and moderate tibial  disease.  I would not consider performing a revascularization procedure  for claudication symptoms alone unless she was very debilitated by these  as she has primarily only single vessel runoff.   As far as the rash on her legs is concerned I believe she also has some  component of venous insufficiency.  I have written her a prescription  today for bilateral venous compression stockings 25-30 mm.  She will  wear these continuously during the daytime and hopefully will get some  symptomatic relief from her leg swelling and pain.  She will follow up  with me in three months' time.  At that time we will do a venous duplex  study as well to see if she has had some improvement.   Janetta Hora. Fields, MD  Electronically Signed   CEF/MEDQ  D:  08/22/2008  T:  08/22/2008  Job:  [id number]   cc:   Rema Fendt, PA

## 2010-12-08 NOTE — Assessment & Plan Note (Signed)
OFFICE VISIT   [name], [name]  DOB:  06-17-1946                                       03/01/2007  ZOXWR#:[id number]   The patient is a 65 year old female who I previously saw in 2004 for  bilateral claudication symptoms.  She has also had a left ingrown  toenail which healed spontaneously.  She presents today complaining  again of claudication symptoms, her right leg is worse than her left.  Her claudication symptoms occur at 1 block.  She has difficulty making  it from the parking lot into work.  She has fairly limited lifestyle due  to this.  She also complains of intermittent bilateral foot swelling.  She has some symptoms of what sounds like possible early rest pain in  the right foot.  She has no wounds on the feet.  Her atherosclerotic  risk factors include diabetes, hypertension, elevated cholesterol,  congestive failure and COPD.   PAST SURGICAL HISTORY:  1. Hysterectomy.  2. Cholecystectomy.   MEDICATIONS:  1. Metoprolol 100 mg twice a day.  2. Amlodipine 5 mg once a day.  3. Hydrochlorothiazide 25 mg once a day.  4. Lasix 40 mg once a day.  5. Lexapro 20 mg once a day.  6. Aspirin 81 mg once a day.  7. Potassium chloride 40 mEq once a day.  8. Nitroglycerin as needed.  9. Metformin 250 mg once a day.   She has no known drug allergies.   FAMILY HISTORY:  Fairly unremarkable.   SOCIAL HISTORY:  She works as a Software engineer at [employer].  She is  widowed.  She has 1 child.  She quit smoking in 2004.  She does not  consume alcohol.   REVIEW OF SYSTEMS:  She is 5 foot 3, 209 pounds.  She has some  occasional chest pain when she is very active.  She takes nitroglycerin  for this.  She also is occasionally short of breath.  She apparently had  cardiac catheterization in June of 2007 by Dr. Myrtis Ser and per her history  she stated she had a right coronary lesion which they could not stent  and were treating medically.  Review of systems is also  remarkable for a  mini-stroke or a TIA in the past as well as multiple joint arthritis  pain and some decline in her eyesight.  She denies any history of GI  bleeding or renal insufficiency.   PHYSICAL EXAMINATION:  Blood pressure is 154/89, heart rate is 61 and  regular.  HEENT:  Unremarkable.  She has 2+ carotid pulses without bruit.  CHEST:  Clear to auscultation.  CARDIAC:  Regular rate and rhythm.  ABDOMEN:  Soft and nontender.  She has 2+ brachial and radial pulses bilaterally.  She has 2+ femoral  with absent popliteal and pedal pulses bilaterally.  There are no ulcers  on the feet.  Feet are warm, pink and adequately perfused.   She has bilateral ABIs with segmental pressures performed today.  ABI on  the right side was 0.65 and 0.65 on the left as well.  She had  monophasic Doppler signals through the popliteal and tibialis.  This was  suggestive of bilateral superficial femoral artery occlusive disease.   The patient currently is not at risk of limb loss in view of her ABIs in  the 0.6  range.  However, she does seem to have fairly limiting lifestyle  claudication.  I believe the best option for her is an arteriogram with  lower extremity run-off with possible angioplasty and stenting of her  superficial femoral arteries.  If the anatomy is not amenable to this we  could consider femoral popliteal bypass if she wished to consider this.  We have scheduled her arteriogram on March 24, 2007.  We would stent  the right leg first if amenable to this.  She will stop her metformin 24  hours prior to the procedure and hold this for 48 hours afterwards to  avoid contrast reaction.  I explained to her the procedure details,  risks, benefits, the possible complications today.  She understands and  agrees to proceed.   Janetta Hora. Fields, MD  Electronically Signed   CEF/MEDQ  D:  03/01/2007  T:  03/02/2007  Job:  [id number]   cc:   Rema Fendt, PA

## 2010-12-11 HISTORY — PX: EYE SURGERY: SHX253

## 2010-12-11 NOTE — H&P (Signed)
NAME:  [name], [name] NO.:  [id number]   MEDICAL RECORD NO.:  [id number]                   PATIENT TYPE:  EMS   LOCATION:  [facility]                                 FACILITY:  [facility]   PHYSICIAN:  Willa Rough, M.D.                  DATE OF BIRTH:  May 12, 1946   DATE OF ADMISSION:  01/06/2004  DATE OF DISCHARGE:                                HISTORY & PHYSICAL   CHIEF COMPLAINT:  Chest pain.   HISTORY OF PRESENT ILLNESS:  [name] is a 65 year old female with no  known history of coronary artery disease.  She has multiple cardiac risk  factors and was at work today, being moderately active and bending over when  she had sudden onset of left upper chest pain that was sharp in quality at  an 8/10 and radiated towards the sternum.  This was not relieved by rest and  did worsen with position change or with deep inspiration.  The pain lasted  about two hours.  She went to an urgent care center where she received  aspirin and was transported by EMS to [facility].  She is pain-free  at the time of exam.  Associated symptoms include diaphoresis, but no  nausea, vomiting, or shortness of breath was noted.  Additionally, she  stated that she felt a little lightheaded and possible presyncopal, but did  not lose consciousness.   PAST MEDICAL HISTORY:  Significant for:  1. Hypertension.  2. Hyperlipidemia not on medications.  3. Borderline diabetes.  4. Family history of premature coronary artery disease.  5. History of peripheral vascular disease as well.  She has a history of     cholelithiasis and a remote history of tobacco use.  She has a history of     gastritis and gastrointestinal reflux disease symptoms.   PAST SURGICAL HISTORY:  Cholecystectomy.   ALLERGIES:  No known drug allergies.   MEDICATIONS:  Norvasc 5 mg p.o. daily.   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  She quit tobacco about  a year ago with a 15-pack-year history and does not abuse alcohol or drugs.   FAMILY HISTORY:  Her mother died in her 53s from a CVA, but had coronary  artery disease.  Her father died at age 65 of an MI.  She has 10 brothers,  five of them have died and some have coronary artery disease.  She has four  sisters, two of whom have died, but without heart disease.   REVIEW OF SYSTEMS:  Significant for a left-sided headache for three days.  She had chills today.  She has gained about 15 pounds since she quit  smoking.  Chest pain as described above.  She has occasional episodes of  numbness in her left hand and  cramping pain, as well as possible  claudication symptoms in her lower extremities.  She has occasional  wheezing.  She has daily reflux symptoms and describes occasional dysphagia.  She states that her hair is thinning and she has nocturia, as well as some  polyuria and incontinence, probably stress incontinence.  The review of  systems is otherwise negative.   PHYSICAL EXAMINATION:  VITAL SIGNS:  The temperature is 97.8 degrees, blood  pressure 129/66, pulse 58, respiratory rate 20, and O2 saturations 97% on 2  L.  GENERAL APPEARANCE:  She is an obese, well-developed, African American  female in no acute distress.  HEENT:  Her head is normocephalic and atraumatic with pupils equal, round,  and reactive to light and accommodation.  Extraocular movements are intact.  Sclerae are clear.  Nares without discharge.  NECK:  Supple.  There is no lymphadenopathy, thyromegaly, bruit, or JVD  appreciated.  CARDIOVASCULAR:  Her heart is regular in rate and rhythm with a good S1 and  S2 and no significant murmur, rub, or gallop is noted.  Her distal pulses  are less than 1+, but no femoral bruits are appreciated.  LUNGS:  Clear to auscultation bilaterally.  ABDOMEN:  Soft and has slight diffuse tenderness, but no rebound or guarding  is noted.   EXTREMITIES:  She has less than 1+ DP and PT pulses bilaterally with the  only reliably palpated pulse being the right DP.  Bilateral radial pulses  are 2+.  Capillary on both lower extremities is within normal limits.  MUSCULOSKELETAL:  There is no joint deformity or effusions.  No spine or CVA  tenderness is noted.  NEUROLOGIC:  She is alert and oriented with cranial nerves II-XII grossly  intact.   LABORATORY DATA:  Chest x-ray:  No acute disease, borderline heart size.  EKG:  Sinus rhythm, rate 61 with no acute ischemic changes.  No old is  available for comparison at this time.   Hemoglobin 11.8, hematocrit 35.6, WBC 6.2, platelets 304.  Sodium 139,  potassium 3.3, chloride 105, CO2 26, BUN 15, creatinine 0.9, glucose 119.  CK-MB 2.4, troponin I 0.05 by point of care.   ASSESSMENT AND PLAN:  1. Chest pain.  There are atypical features to this, but it started with     exertion and lasted greater than one hour.  She will be admitted and MI     will be ruled out by enzymes.  Aspirin, heparin, and nitroglycerin will     be added to her medication regimen.  With multiple cardiac risk factors     and duration of pain greater than an hour, cardiac catheterization is the     best option and she is on the add-on board for tomorrow.  2. Hypertension.  __________ load ACE inhibitor (no beta blockers secondary     to bradycardia) and continue Norvasc.  3. Peripheral vascular disease.  She saw Dr. Darrick Penna approximately a year ago     with a history of decreased ABIs and claudication symptoms.  Encourage     followup with [facility].  4. Hyperlipidemia.  Check in a.m.  Will add Lipitor 40 mg daily (one-half of     an 80 mg tablet) for cost savings.  5. Hypokalemia.  She is not on a diuretic, but will supplement and follow.     The patient is otherwise stable.   Willa Rough, M.D., saw the patient and determined the plan of care.      Bjorn Loser  Barrett, P.A. [facility]                  Willa Rough,  M.D.    RB/MEDQ  D:  01/06/2004  T:  01/06/2004  Job:  [id number]

## 2010-12-11 NOTE — Op Note (Signed)
[place]  Patient:    [name], [name]                         MRN: [id number] Proc. Date: 10/20/99 Adm. Date:  [id number] Attending:  Jaci Standard                           Operative Report  PREOPERATIVE DIAGNOSIS:  Cholelithiasis.  POSTOPERATIVE DIAGNOSIS:  Cholelithiasis.  OPERATION:  Laparoscopic cholecystectomy.  SURGEON:  Lorne Skeens. Hoxworth, M.D.  ANESTHESIA:  General.  BRIEF HISTORY:  [name] is a 65 year old black female who presents with a three week history of persistent nausea, vomiting and epigastric pain.  She has had a thorough workup including an ultrasound and a CT scan both of which suggest small nonshadowing gallstones.  The remainder of her evaluation has been negative. Laparoscopic cholecystectomy has been recommended and accepted.  The nature of he procedure, its indications and risks of bleeding, infection, bowel leak and bowel distention were discussed and understood.  She is now brought to the operating oom for this procedure.  DESCRIPTION OF PROCEDURE:  The patient was brought to the operating room and placed in the supine position on the operating table and general endotracheal anesthesia was induced.  The abdomen was sterilely prepped and draped.  Broad spectrum antibiotics had been given intravenously.  PS were in place.  Local anesthesia as used to infiltrate the trocar sites prior to the incisions.  A 1 cm incision was made at the umbilicus and dissection carried down to the midline fascia.  This as sharply incised for 1 cm transversely and the peritoneum entered directly.  An Hasson trocar was inserted through a mattress suture of 0 Vicryl.  Under direct  vision a 10 mm trocar was placed in the subxiphoid area and two 5 mm trocars along the right subcostal margin.  The gallbladder did not appear acutely inflamed. he fundus was grasped with lateral grasper and fairly extensive omental  adhesions f a chronic nature were taken down off the gallbladder using blunt and cautery dissection.  The infundibulum was exposed and retracted inferiorly laterally. Further fatty tissue was stripped off the neck of the gallbladder toward the porta hepatis.  The distal gallbladder was thoroughly dissected. The anterior branch f the cystic artery was seen coursing up onto the gallbladder wall and the gallbladder cystic duct junction was dissected 360 degrees and the cystic duct dissected free for about a cm.  When the anatomy was completely clear, the anterior branch of the cystic artery was divided between clips and the cystic duct was doubly clipped proximally, clipped distally and divided.  A small branch of cystic artery posteriorly was clipped proximally and divided with cautery and the gallbladder was then dissected free from its bed using hook and spatula cautery and removed through the umbilicus. Complete hemostasis was assured at the gallbladder bed and the right upper quadrant irrigated and suctioned until clear.  Trocars ere removed under direct vision and all CO2 evacuated.  The pursestring suture was secured to the umbilicus. Skin incisions were closed with interrupted subcuticular 4-0 Monocryl and Steri-Strips.  Dry sterile dressings were applied.  Sponge, needle and instrument counts were correct. I opened the specimen on the back table and it did contain several small stones.  The patient was then taken to the recovery room in good condition having tolerated the procedure well. DD:  10/20/99  TD:  10/21/99 Job: [id number]

## 2010-12-11 NOTE — H&P (Signed)
NAMEADY, HEIMANN NO.:  [id number]   MEDICAL RECORD NO.:  [id number]          PATIENT TYPE:   LOCATION:                                 FACILITY:   PHYSICIAN:  Willa Rough, M.D.          DATE OF BIRTH:   DATE OF ADMISSION:  DATE OF DISCHARGE:                                HISTORY & PHYSICAL   [name] is admitted now for chest pain, shortness of breath, nausea and  vomiting.  The patient has known coronary disease.  I had seen her on Dec 23, 2005.  At that time, I felt that she might be mildly volume overloaded.  We increased her Lasix.  The patient returns today stating that she has had  marked shortness of breath.  In addition she had some chest discomfort and  some vomiting during the evening, and her shortness of breath persists.  She  is stable at the moment in our office.   The patient does have coronary disease.  She had a mild distal circumflex  lesion being treated in the past.   ALLERGIES:  No known drug allergies.   MEDICATIONS:  1.  Lexapro 20.  2.  Norvasc 5.  3.  Niaspan 500.  4.  Simvastatin 40.  5.  Potassium 10.  6.  Detrol LA 4.  7.  Metoprolol 100 b.i.d.  8.  Aspirin 81.  9.  Lasix 40.  10. Multivitamin.  11. Fish oil.  12. Other vitamins.   OTHER MEDICAL PROBLEMS:  See the complete list below.   SOCIAL HISTORY:  The patient does not smoke.  The patient does work as a  Lawyer.   FAMILY HISTORY:  The family history is noncontributory at this time.   REVIEW OF SYSTEMS:  The patient is not having any fevers or chills.  She is  not having any major headaches.  She is complaining of shortness of breath.  As mentioned, she has some nausea and vomiting.  She has some arthralgias.  Otherwise the review of systems is negative.   PHYSICAL EXAMINATION:  VITAL SIGNS:  Blood pressure at this time is 124/86  with a pulse of 66.  She weighs 223 pounds. Temperature is 98.7.  GENERAL:  The patient is oriented to person, time and  place, and her affect  is normal.  LUNGS:  Clear.  Respiratory effort is not labored.  HEENT:  Reveals no xanthelasma.  She has normal extraocular motion.  There  are no carotid bruits.  There is no jugular venous distention.  CARDIAC:  Exam reveals an S1 with an S2.  There are no clicks or significant  murmurs.  ABDOMEN:  Obese but soft.  She has no significant peripheral edema at this  time.   All labs are pending other than her EKG.  The EKG today shows sinus rhythm.  There is no significant change from the past.   Problems include:  1.  A history of urinary incontinence.  There was the question that she      might need a procedure,  but she is on a medication and stable.  2.  Hypertension, being treated.  3.  Diabetes.  Although this is listed as a problem, I do not have her      listed as being on diabetic medications at this time.  4.  Dyslipidemia.  The patient is on Niaspan.  5.  Status post cholecystectomy and hysterectomy.  6.  Coronary disease.  A cath was done in August of 2006.  She had distal      disease.  7.  Obstructive sleep apnea.  She needs CPAP by history but does not like      using it.  8.  A history of elevated D-dimer but a negative CT in the past.  Therefore      D-dimer will not help [place].  We will have to decide if she needs another      chest CT.  9.  A history of good LV function.  10. Obesity.  11. Question of peripheral vascular disease.  12. Current situation with shortness of breath, some chest discomfort,      nausea and vomiting.   The patient is to be admitted for further evaluation.  I considered very  carefully whether this could be done as an outpatient, and I am  uncomfortable with her having nausea and vomiting along with the discomfort,  even though her EKG is normal.  The patient will be admitted with standard  routine admission bloods.  In addition, there will be TSH and three sets of  cardiac enzymes.  If her first troponin is normal,  she will have a CT of the  chest to rule out pulmonary embolus.  Then based on her chest x-ray and  volume status, the decision can be made about whether diuresis will help her  or not and whether catheterization should be repeated at this time.           ______________________________  Willa Rough, M.D.     JK/MEDQ  D:  01/14/2006  T:  01/14/2006  Job:  [id number]   cc:   Teena Irani. Arlyce Dice, M.D.  Fax: [phone]

## 2010-12-11 NOTE — Procedures (Signed)
[place]  Patient:    FAYDRA, KORMAN                         MRN: [id number] Proc. Date: 10/20/99 Adm. Date:  [id number] Attending:  Jaci Standard CC:         Memory Dance, M.D., M.P.H.                           Procedure Report  PROCEDURE:  Esophagogastroduodenoscopy.  MEDICATIONS:  Hurricaine spray, fentanyl 40 mcg, Versed 4 mg IV.  INDICATIONS:  Upper abdominal pain of unclear etiology.  DESCRIPTION OF PROCEDURE:  The procedure being explained to the patient, consent obtained.  The patient in left lateral decubitus position.  The Olympus video endoscope was inserted blindly into the esophagus and advanced under visualization. The stomach was entered, pylorus identified and passed.  The duodenum, including the bulb and second portion were seen well.  There were mild duodenal erosions. No ulceration.  The pyloric channel, antrum and body were completely normal, as was the fundus and cardia.  A small hiatal hernia.  Distal and proximal esophagus were normal.  Scope withdrawn.  Patient tolerated procedure well.  ASSESSMENT:   Small duodenal erosions, otherwise negative endoscopy, other than  hiatal hernia.  PLAN:  Will consider going ahead with cholecystectomy.  Will discuss this further with Dr. Corine Shelter. DD:  10/20/99 TD:  10/20/99 Job: [id number]

## 2010-12-11 NOTE — Discharge Summary (Signed)
Tammy, [name]                ACCOUNT NO.:  [id number]   MEDICAL RECORD NO.:  [id number]          PATIENT TYPE:  INP   LOCATION:  3741                         FACILITY:  [facility]   PHYSICIAN:  Dr. Tenny Craw               DATE OF BIRTH:  10/27/1945   DATE OF ADMISSION:  01/14/2006  DATE OF DISCHARGE:  01/17/2006                           DISCHARGE SUMMARY - REFERRING   DISCHARGE DIAGNOSIS:  1.  Shortness of breath of uncertain etiology.  2.  Hypokalemia, resolved.  3.  History as noted below.   HISTORY:  [name] is a 65 year old female who was seen in the office on  January 14, 2006 by Dr. Myrtis Ser with complaints of chest discomfort, shortness of  breath, nausea and vomiting.  With her history of known coronary artery  disease, it is felt that she should be admitted for further evaluation.   PAST MEDICAL HISTORY:  1.  Urinary incontinence.  2.  Hypertension.  3.  Diabetes.  4.  Dyslipidemia.  5.  Known coronary artery disease by catheterization in August of 2006.      Medical treatment of distal disease.  6.  Obstructive sleep apnea with a history of CPAP usage; however, the      patient is not compliant.  7.  Obesity.  8.  Questionable history of peripheral vascular disease.   LABORATORY DATA:  Admission weight was 217.  H&H of 12.1 and 35.8.  Normal  indices.  Platelet 260, WBCs 7.7.  D-dimer was elevated at 1.32.  Sodium  142, potassium 31, BUN 14, creatinine 0.9.  Normal LFTs.  Glucose 144.  Subsequent potassium on January 17, 2006 was 3.6, BUN and creatinine 12 and  1.1.  CK-MBs and relative indexes were negative x3.  Troponins were 0.06,  0.06 and 0.07.  TSH was 1.621.  Chest CT showed bibasilar atelectasis, a few  scattered anterior mediastinal lymph nodes which were felt to be stable.  No  evidence of pulmonary embolism.   HOSPITAL COURSE:  [name] was admitted to 3700 from the office for  further evaluation of her symptoms.  Overnight, she ruled out for myocardial  function.  However, she continued to have shortness of breath with minimal  walking.  Nursing nutrition assisted with educational needs.  She did not  have any further chest discomfort.  However, her shortness of breath  persisted.  She continued to have good O2 saturations during her admission.  Telemetry shows normal sinus rhythm.  By January 17, 2006, she stated that her  breathing had improved.  Dr. Tenny Craw ordered PFT's.  FEV-1 was 1.63, 79%  predicted, FEV-1/FVC was 96% predicted.  Final results and interpretation  are pending physician's review.  Dr. Tenny Craw felt that the patient could be  discharged with further evaluation in regards to her shortness of breath as  an outpatient.   DISPOSITION:  [name] was discharged home.   DIET:  Asked to maintain low salt, fat, cholesterol diet.   ACTIVITY:  Her activities are not restricted.   MEDICATIONS:  She was  asked to bring all medications to all appointments.  Her medications remained unchanged from admission.  These include:  1.  Lexapro 20 mg daily.  2.  Metoprolol 100 mg b.i.d.  3.  Detrol 40 mg daily.  4.  Norvasc five daily.  5.  Aspirin 81 daily.  6.  Prilosec 20 daily.  7.  Potassium 20 mEq daily.  She was asked to continue:  1.  Fish oil as previously.  2.  Multivitamins as previously.  3.  Vitamin E as previously.  4.  Vitamin B as previously.   FOLLOWUP:  1.  She will follow up with Dr. Sherene Sires for further evaluation of her shortness      of breath on January 24, 2006 at 9:45 a.m.  2.  She will also follow up with Dr. Myrtis Ser' P.A. on February 04, 2006 at 12      o'clock, as Dr. Myrtis Ser does not have any available appointments  until      August.   DISCHARGE TIME:  Less than 30 minutes.      Joellyn Rued, P.A. [facility]    ______________________________  Dr. Tenny Craw    EW/MEDQ  D:  01/17/2006  T:  01/17/2006  Job:  [id number]   cc:   Willa Rough, M.D.  [address]  [address]  [place]  [place] [address]   Charlaine Dalton. Sherene Sires, M.D. [facility]  [address]  [place]  [place] [address]

## 2010-12-11 NOTE — Discharge Summary (Signed)
[name], [name]                ACCOUNT NO.:  [id number]   MEDICAL RECORD NO.:  [id number]          PATIENT TYPE:  INP   LOCATION:  3708                         FACILITY:  [facility]   PHYSICIAN:  Arvilla Meres, M.D. LHCDATE OF BIRTH:  1946/06/22   DATE OF ADMISSION:  03/03/2005  DATE OF DISCHARGE:  03/06/2005                                 DISCHARGE SUMMARY   PRIMARY CARE PHYSICIAN:  [name]   PRIMARY CARDIOLOGIST:  Dr. Willa [name]   PRINCIPAL DIAGNOSIS:  Chest pain/coronary artery disease.   OTHER DIAGNOSES:  1.  Gastroesophageal reflux disease.  2.  Hypertension.  3.  Hyperlipidemia.  4.  History of cholelithiasis status post cholecystectomy.  5.  History of cerebrovascular accident.  6.  Remote tobacco abuse.  7.  Borderline diabetes.   ALLERGIES:  NO KNOWN DRUG ALLERGIES.   PROCEDURES:  1.  Left heart cardiac catheterization.  2.  Chest CT.   HISTORY OF PRESENT ILLNESS:  This is a 65 year old female with prior history  of CAD that has been medically managed since 2005.  She was in her usual  state of health approximately one week ago when she began to experience  progressive exertional left parasternal chest heaviness associated with  dyspnea lasting approximately 15 minutes and relieved with rest or  sublingual nitroglycerin.  Secondary to progressive symptoms, she presented  to the [facility] ED on August 9 for evaluation.  She ruled out for MI and  was admitted for further evaluation.   HOSPITAL COURSE:  The patient underwent a left heart cardiac catheterization  on August 12, which revealed mild obstructive coronary disease with an EF of  65%.  A D-dimer was checked post procedure and was noted to be elevated at  1.19 and secondary to this she underwent CT of the chest which was negative  for pulmonary embolism.  She did not have any recurrent chest discomfort  and, therefore, is being discharged home today.   DISCHARGE LABORATORIES:  Hemoglobin  12.9,  hematocrit 38.0, WBC 5.8,  platelets 278,000.  Sodium 136, potassium 4.1, chloride 103, CO2 28, BUN 11,  creatinine 0.8, glucose 114.  D-dimer 1.19.  CK 189, MB 1.3, troponin I  0.04.  Total cholesterol 114, triglycerides 156, HDL 36, LDL 47.  TSH 1.52.   DISPOSITION:  The patient is being discharged home today in good condition.   FOLLOWUP PLANS AND APPOINTMENTS:  1.  She has a followup with Dr. Myrtis Ser on September 15 at 3:30 p.m.  2.  She has to see her primary care physician, Dr. Arlyce [name] in one to two      weeks.   MEDICATIONS:  1.  Norvasc 10 mg daily.  2.  Lipitor 40 mg q.h.s.  3.  Aspirin 81 mg daily.  4.  Nitroglycerin 0.4 mg sublingual p.r.n. chest pain.  5.  Lopressor 100 mg b.i.d.  6.  HCTZ 25 mg daily.  7.  Potassium chloride 10 mEq daily.  8.  Cholesterol medicine as previously prescribed.  9.  Prilosec 20 mg b.i.d.   OUTSTANDING LABORATORIES AND STUDIES:  None.  DURATION OF DISCHARGE ENCOUNTER:  45 minutes.      Ok Anis, NP      Arvilla Meres, M.D. [facility]  Electronically Signed    CRB/MEDQ  D:  03/06/2005  T:  03/06/2005  Job:  [id number]   cc:   [name], M.D.  [address]  [place]  [place] [address]  Fax: [phone]   [name], M.D.  [address]  [address]  [place]  [place] [address]

## 2010-12-11 NOTE — Cardiovascular Report (Signed)
NAME:  [name], [name]                          ACCOUNT NO.:  [id number]   MEDICAL RECORD NO.:  [id number]                   PATIENT TYPE:  INP   LOCATION:  2041                                 FACILITY:  [facility]   PHYSICIAN:  Charlton Haws, M.D.                  DATE OF BIRTH:  1945-11-02   DATE OF PROCEDURE:  DATE OF DISCHARGE:                              CARDIAC CATHETERIZATION   PROCEDURE:  Coronary arteriography.   INDICATIONS:  Chest pain requiring hospital admission.   Cine catheterization was done from the right femoral artery.   JL4 and JR4 catheters were used.   1. The left main coronary artery had a 20% discrete stenosis.  2. The left anterior descending artery had 20-30% multiple discrete lesions     in the mid- and distal vessel.  First diagonal branch was a branching     vessel with 40% multiple discrete lesions.  3. The circumflex coronary artery was a medium-sized vessel.  There was 40%     tubular disease in the midvessel.  The distal vessel was somewhat small     in the 2.0-2.5 mm range.  There was a 70-80% tubular lesion in the distal     circumflex.  The first obtuse marginal branch had 30% multiple discrete     lesions.  4. The right coronary artery was dominant.  There were 30% multiple discrete     lesions in the mid- and distal vessel.   RAO VENTRICULOGRAPHY:  RAO ventriculography showed hyperdynamic LV function,  EF of 70%.  There was no gradient across the aortic valve and no MR.  Aortic  pressure was in the 138/78 range, LV pressure was in the 140/20 range.   IMPRESSION:  Even though the patient's presentation was somewhat atypical,  the distal circumflex lesions could certainly cause angina given the fact  that she is obese and that Cardiolite imaging has low specificity in the  distal circumflex region.  I suspect that simple balloon angioplasty of the  distal circumflex may be in order.   I will review the films with [name], and we will make a  final  recommendation about medical therapy versus percutaneous intervention to the  distal circumflex.                                               Charlton Haws, M.D.    PN/MEDQ  D:  01/07/2004  T:  01/08/2004  Job:  [id number]

## 2010-12-11 NOTE — Procedures (Signed)
NAME:  [name], [name]              ACCOUNT NO.:  [id number]   MEDICAL RECORD NO.:  [id number]          PATIENT TYPE:  OUT   LOCATION:  SLEEP CENTER                 FACILITY:  [facility]   PHYSICIAN:  Marcelyn Bruins, M.D. [facility] DATE OF BIRTH:  11-20-1945   DATE OF ADMISSION:  02/21/2004  DATE OF DISCHARGE:  02/21/2004                              NOCTURNAL POLYSOMNOGRAM   REFERRING PHYSICIAN:  Dr. Willa Rough   INDICATION FOR THE STUDY:  Hypersomnia with sleep apnea.   SLEEP ARCHITECTURE:  Patient had a total sleep time of 319 minutes with  significantly decreased REM and slow wave sleep.  Sleep onset latency was  slightly prolonged at 35 minutes and the REM latency was very prolonged at  209 minutes.   IMPRESSION:  1. Moderate obstructive sleep apnea/hypopnea syndrome with mild oxygen     desaturation.  The patient had a Respiratory Disturbance Index of 23     events/hr.  The events were not positional nor were they definitely rapid     eye movement related.  2. Loud snoring noted throughout the study.  3. No clinically significant cardiac arrhythmia.                                   ______________________________                                Marcelyn Bruins, M.D. [facility]     KC/MEDQ  D:  03/06/2004 16:48:33  T:  03/07/2004 21:21:43  Job:  [id number]

## 2010-12-11 NOTE — Discharge Summary (Signed)
NAME:  [name], [name]                          ACCOUNT NO.:  [id number]   MEDICAL RECORD NO.:  [id number]                   PATIENT TYPE:  INP   LOCATION:  2041                                 FACILITY:  [facility]   PHYSICIAN:  Willa Rough, M.D.                  DATE OF BIRTH:  05-13-1946   DATE OF ADMISSION:  01/06/2004  DATE OF DISCHARGE:  01/11/2004                           DISCHARGE SUMMARY - REFERRING   SUMMARY OF HISTORY:  [name] is a 65 year old female without prior  history of coronary artery disease although she does have multiple risk  factors. While at work on the day of admission while being moderately active  and bending over, she developed sudden onset of left upper chest discomfort  that she described as sharp. Gave it an 8 on a scale of 0 to 10 which  radiated to the sternum. It was not relieved with rest and did worsen with  position change and with deep inspiration. It lasted about two hours. She  went to an urgent care center where she received aspirin and was transferred  via EMS to [facility]. At the time of presentation, she was pain  free. Associated symptoms include diaphoresis. Her history is notable for  hypertension, hyperlipidemia which is not treated, borderline diabetes,  peripheral vascular disease, and remote tobacco use.   LABORATORY DATA:  Chest x-ray on June 13 showed no active disease with  borderline heart size. CT on June 15 was negative for pulmonary embolism.  She did have basilar subsegmental atelectasis. Admission H and H was 11.8  and 35.6, normal indices, platelets 304, WBC 6.2. Subsequent hematologies  were essentially unremarkable on June 18. Prior to discharge, her H and H  was 11.6 and 33.9, platelets 259, WBC 7.6. Admission PTT was 25 with a PT of  13.2. Admission sodium was 139, potassium 3.3, BUN 15, creatinine 0.9.  Normal LFTs. Glucose 119. Subsequent chemistries showed correction of her  hypokalemia on June 16. Her  sodium was 137, potassium 3.5, BUN 7, creatinine  0.7, glucose 115. Hemoglobin A1c was elevated at 6.4. CK-MB and troponins  were negative x2. Fasting lipids showed a total cholesterol of 142,  triglycerides 194, HDL low at 27, LDL 76. TSH was 1.040. Echocardiogram  showed EF of 50 to 55%, no wall motion abnormalities, mild LVH, mild  ____________, and small left pleural effusion. EKG showed normal sinus  rhythm, left axis deviation, nonspecific ST-T wave changes.   HOSPITAL COURSE:  [name] was admitted to the Unit 2000. She was placed  on IV heparin as well as her home medication of Norvasc. Overnight, she did  have two episodes of chest discomfort unassociated with shortness of breath,  one with shortness of breath. Enzymes and EKGs were negative for myocardial  infarction; however, with her risk factors, it is felt that she should  undergo cardiac catheterization. This was performed  by Dr. Eden Emms. This  showed a left main at 20%, a 20 to 30% mid distal LAD, a 40% diagonal 1, 40%  mid circumflex, 70 to 80% distal circumflex, 30% OM, 30% mid distal RCA. She  had EF of 70% which was hyperdynamic. Dr. Eden Emms reviewed the films with Dr.  Riley Kill and felt that probable angioplasty of the distal circumflex should  be performed; however, the vessel was very, very small. [name] reviewed  the films and discussed with the patient. [name] felt that her symptoms  were atypical, but he felt that the lesion although approachable could have  difficulty getting a stent down the posterior circumflex. He would favor a  trial of medical treatment with intervention for worsening symptoms. He is  not convinced that the symptoms correspond to her anatomy. A D-dimer was  also checked. Echocardiogram was also performed. On June 15, she continued  to have episodes of chest discomfort. CT was ordered that did not show any  evidence of pulmonary embolism. Lipids were obtained with the previously   mentioned results and a statin started. Postoperative catheterization,  catheterization site was intact, and she was ambulating without difficulty.  Chest CT did not show any evidence of pulmonary embolism. Dr. Gerri Spore saw  her on June 18 and felt that she could be discharged home as her discomfort  is very atypical and doubt cardiac origin.   DISCHARGE DIAGNOSES:  1. Atypical chest discomfort of uncertain etiology.  2. Nonobstructive coronary artery disease, continue medical treatment.  3. Hypertension.  4. Hyperlipidemia.  5. Hypokalemia.   DISPOSITION:  [name] is discharged home. Her medications include:  1. Coated aspirin 325 mg q.d.  2. Lipitor 40 mg q.h.s.  3. Lisinopril 5 mg q.d.  4. Norvasc 5 mg q.d.  5. Lopressor 50 mg half a tablet b.i.d.  6. Sublingual nitroglycerin as needed.   She was advised to maintain a low salt, fat, and cholesterol ADA diet. Her  activity was not restricted since her catheterization was several days ago.  She was asked to call us if she had any problems with her catheterization  site. She was asked to call our office on Monday to arrange a two to three  week appointment with Dr. Myrtis Ser now that she is living in [place]. At that  time, fasting lipids and LFTs will need to be arranged for six to eight  weeks since Lipitor was initiated. She was also asked to make a followup  appointment with her primary care M.D., Dr. Arlyce Dice, in regards to her sugars  and elevated hemoglobin A1c as a treatment probably should be initiated and  to follow up with Dr. Darrick Penna.      Joellyn Rued, P.A. [facility]                    Willa Rough, M.D.    EW/MEDQ  D:  01/11/2004  T:  01/13/2004  Job:  [id number]   cc:   Arlyce Dice, M.D.

## 2010-12-11 NOTE — Consult Note (Signed)
[place]  Patient:    [name], [name]                         MRN: [id number] Adm. Date:  [id number] Attending:  Jaci Standard CC:         [name], M.D., M.P.H.                          Consultation Report  REASON FOR CONSULTATION:  Nausea and vomiting.  HISTORY OF PRESENT ILLNESS:  A delightful 65 year old patient of Dr. Corine Shelter who has had three weeks of nausea and vomiting. She states began after a viral illness. She had low-grade fever for several days, no loose stools. All that cleared up, and she has continued to have nausea and vomiting. She has had no hematemesis. CBC as been normal. She has had no further fever. She has had minimal abdominal pain; usually after she vomits and retches, she will have some cramps in her abdomen. She has been somewhat constipated, but this is normal for her. Her workup has included labs which are reported to be normal, an upper GI reported to be normal. Gallbladder ultrasound showed possible sludge within the gallbladder. CT showed  some fluid in the gallbladder. No other abnormalities, normal-appearing pancreas.  CURRENT MEDICATIONS:  Some type of blood pressure medicine, Flexeril, hydrochlorothiazide.  ALLERGIES:  She is allergic to PREDNISONE causes headaches.  PAST MEDICAL HISTORY: 1. Hypertension. 2. Has a history of peptic ulcer disease in 1984. 3. Had a surgery 1978 that consisted of a bilateral salpingo-oophorectomy and    total abdominal hysterectomy.  FAMILY HISTORY:  Brothers have had lung cancer. Her mother did have gallstones.  Mother and father died of heart disease and strokes.  SOCIAL HISTORY:  She is married, has one daughter, works at the [employer].  PHYSICAL EXAMINATION:  VITAL SIGNS:  The patient is afebrile, pulse 60, blood pressure 161/82.  GENERAL:  Pleasant, black female. No acute distress.  HEENT:  Eyes:  Sclerae  nonicteric.  NECK:  Supple. No lymphadenopathy.  LUNGS:  Clear.  HEART:  Regular rate and rhythm without murmurs or gallops.  ABDOMEN:  Soft, nontender, nondistended. Bowel sounds are normal. There is mild, if any, tenderness in the epigastrium.  RECTAL:  Deferred.  LABORATORY DATA:  All pending.  ASSESSMENT:  Nausea, vomiting, abdominal pain. This could be due to a multitude of things. My suspicion is biliary tract disease based on the symptoms and the slight findings on ultrasound. She has had a history of ulcer disease and does have some water brash. I think for that reason esophageal ulcer or esophagitis ought to be ruled out.  PLAN:  We will proceed with an endoscopy in the morning. If this is negative, could consider a hepatobiliary scan and/or surgery consult. I agree with the ______ nd not Prevacid. DD:  10/19/99 TD:  10/19/99 Job: [id number]

## 2010-12-11 NOTE — Consult Note (Signed)
NAME:  [name], [name]                          ACCOUNT NO.:  [id number]   MEDICAL RECORD NO.:  [id number]                   PATIENT TYPE:  INP   LOCATION:  2025                                 FACILITY:  [facility]   PHYSICIAN:  Vida Roller, M.D.                DATE OF BIRTH:  [birth year]   DATE OF CONSULTATION:  02/17/2003  DATE OF DISCHARGE:                                   CONSULTATION   PRIMARY CARE PHYSICIAN:  Dr. Arlyce Dice.   REFERRING PHYSICIAN:  Teaching service.   REASON FOR CONSULTATION:  Chest discomfort.   HISTORY OF PRESENT ILLNESS:  [name] is a 65 year old African American  woman with cardiac risk factors of tobacco abuse, hypertension, and known  peripheral vascular disease, who presents with a prolonged episode of  discomfort her chest which she reports as occurring when she was pushing a  patient in a wheelchair.  She works as a Lawyer at a nursing home.  This  discomfort waxed and waned over the course of the last 36-48 hours, during  which time she took Vicodin with some relief.  She was able to sleep, but  finds the pain to be more severe when she lies back and tries to sleep and  much more improved when she leans forward.  She has had significant  shortness of breath as well when she lies back.  She describes moderate  orthopnea with some PND.  There is no lower extremity edema noted.  She also  reports a mild cough that is nonproductive of any sputum.  The discomfort  waxes and wanes, depending on what she does.  There is evidence of that it  is improved with nitroglycerin, though not completely resolved with this.   PAST MEDICAL HISTORY:  1. Significant for hypertension which has been difficult to control, though     she is currently on hydrochlorothiazide and another medication of which     she was unsure of the name.  She is followed by Dr. Arlyce Dice for this.  2. She has a history of a stroke that occurred last year.  She had an MRI of     her head which  showed evidence of distal atherosclerotic disease, but she     has had minimal residua.  It is in the right temporal lobe.  She does     have occasional instability, but has no speech difficulty or lower     extremity or upper extremity problems.  3. She has a history of duodenal erosions on an upper endoscopy done in 2001     prior to her cholecystectomy then.  4. She has a history of cholelithiasis and is status post a laparoscopic     gallbladder resection in 2001.   SOCIAL HISTORY:  She is recently widowed.  Her husband passed away last year  of a massive coronary.  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  She smokes cigarettes and has for the last 25-30 years.  She  smokes about a quarter of a pack a day, maybe for depending on the  situation.  She does not do any drugs.  She does not drink alcohol.   FAMILY HISTORY:  She has one child, a daughter who is healthy and two  grandchildren who are healthy.  She is one of ten children, eight brothers  and one sister.  Four brothers have died of various causes, but none of them  of cardiac disease.  She has one sister who has diabetes mellitus.   ALLERGIES:  She is not allergic to any medications.   CURRENT MEDICATIONS:  1. Hydrochlorothiazide 25 mg a day.  2. Metoprolol 50 mg twice a day.  3. Multivitamins.  4. Protonix 40 mg twice a day.  5. Aspirin 325 mg a day.  6. Ativan as needed.  7. Protonix as needed.  8. Morphine as needed.   PHYSICAL EXAMINATION:  GENERAL APPEARANCE:  She is a moderately obese  African American woman in no apparent distress.  She is alert and oriented x  4 and a very good historian.  VITAL SIGNS:  Her blood pressure currently is 160/60.  Her heart rate is 59  and regular.  Her respiratory rate is 14.  She is afebrile.  HEENT:  Unremarkable.  NECK:  Supple.  There is no jugular venous distention or carotid bruits.  CHEST:  Significantly difficult to examine because of her obesity, but  there  appear to be decreased breath sounds bilaterally at the bases.  There is no  echophony noted and no dullness to percussion.  CARDIAC:  Nondisplaced nonpalpable point of maximum impulse with no lifts or  thrills.  There are no murmurs noted.  There are no rubs noted.  ABDOMEN:  Soft and nontender with normoactive bowel sounds.  EXTREMITIES:  Without significant cyanosis, clubbing, or edema.  Pulses are  2+ throughout.  NEUROLOGIC:  Nonfocal.   LABORATORY DATA:  Her electrocardiogram reveals sinus rhythm at a rate of 59  with normal intervals.  There is mild left axis deviation at -21.  She has  nonspecific ST-T wave changes.  There are no Q waves concerning for old  myocardial infarction.  She has moderate criteria for LVH by voltage.  White  blood cell count 7.7 with a hemoglobin of 10, hematocrit 31, and platelet  count 266,000.  The sodium is 131, potassium 3.1, chloride 105, bicarbonate  29, BUN 7, creatinine 0.6, and blood glucose 111.  Her PT is 13, PTT 31, and  INR 1.0.  Her liver function studies are within normal limits.  She has two  sets of cardiac enzymes that are inconsistent with acute myocardial  infarction.  The chest x-ray shows bilateral lower lobe pneumonia.  The  abdominal series shows no acute disease.   ASSESSMENT:  1. Chest discomfort which is atypical for coronary artery disease.  She has     had three sets of cardiac enzymes now that are negative and includes the     ER's rapid screen.  Her EKG shows nonspecific changes, but no ST-T wave     changes concerning for ischemia.  Her pain is actually pleuritic and is     more concerning for a pericarditis than actually for coronary artery     disease, although with her ongoing pneumonia one wonders if it is     primarily a pneumonic process.  2. She has hypertension which needs to be much better controlled considering     her previous history of a stroke.  3. She also has the chest discomfort as well. 4. She  has had a history of a stroke.  5. She has pneumonia on chest x-ray.   RECOMMENDATIONS:  Our recommendations are that an echocardiogram be obtained  tomorrow to rule out for pericardial effusion and for evidence of  pericarditis.  If this is normal and her left ventricular ejection fraction  is normal, it would be reasonable to consider doing an adenosine Cardiolite  on Tuesday.  If her ejection fraction is abnormal or there is evidence of  coronary perfusion issues, then I think that heart catheterization would not  be unreasonable to do.  With regard to her hypertension, she needs much  better control.  Would recommend that you add a medication, preferably a  calcium channel blocker like Norvasc, which will get some reasonable blood  pressure response without interfering with her heart rate as she is already  beta blocked and has a mild bradycardia.  As far as her history of TIAs is  concerned, she has had an MRI which shows distal vessel disease and she is  hypertensive.  I  suspect that is the etiology.  Consideration could be made also to look at  her carotids, but mostly she needs blood pressure control.  With regard to  her pneumonia, I do not see that she is on any antibiotics for that.  It  might be a consideration if indeed she does have evidence of pneumonia,  although her white blood cell count is normal.                                               Vida Roller, M.D.    JH/MEDQ  D:  02/17/2003  T:  02/17/2003  Job:  [id number]

## 2010-12-11 NOTE — Discharge Summary (Signed)
NAME:  [name], [name]                          ACCOUNT NO.:  [id number]   MEDICAL RECORD NO.:  [id number]                   PATIENT TYPE:  INP   LOCATION:  6526                                 FACILITY:  [facility]   PHYSICIAN:  Salvadore Farber, M.D. [facility]         DATE OF BIRTH:  [birth year]   DATE OF ADMISSION:  04/01/2004  DATE OF DISCHARGE:  04/03/2004                                 DISCHARGE SUMMARY   PROCEDURES:  1.  Lower extremity arterial Dopplers.  2.  Lower extremity venous Dopplers.  3.  ABI's.   HOSPITAL COURSE:  [name] is a 65 year old female with a history of  diabetes, as well as hypertension and peripheral vascular disease.  She had  a catheterization in June of 2005, which showed an 80% osteal circumflex  that was a small vessel, and medical therapy was felt to be the best option.  She came to the hospital on April 01, 2004 for chest pain.  She had  nitroglycerin and morphine with relief.  She was admitted for further  evaluation and treatment.   Her enzymes were negative for MI, and an adenosine Cardiolite was felt the  best option.  She had the adenosine Cardiolite on April 02, 2004.  The  adenosine Cardiolite was technically difficult to read, but showed no  definite ischemia.  It was not gated secondary to poor image quality.  Her D-  dimer was elevated, but in the past it had been elevated, and a CT was  negative.  Dr. Tenny Craw and Dr. Myrtis Ser evaluated [name] and felt that if lower  extremity Dopplers were negative, then no further evaluation was indicated  at this time.   Lower extremity arterial and venous Dopplers were done on April 03, 2004.  There was no evidence of DVT, thrombosis, or Baker's cyst.  The ABI's  indicated severe loss of flow on the left, but unchanged since October of  2004 at [facility].  On duplex imaging, there was bilateral severe plaque in the  femoral artery through the popliteal artery, but no significant focal  stenosis.  She  has seen Dr. Darrick Penna at [facility] in the past, and is to follow up  with him.   [name] had a slightly low potassium level upon arrival at 3.4.  She is  on HCTZ.  It was felt that she did not require potassium supplementation at  this time, as she stated she would drink a small amount of orange juice on a  daily basis.  If this interferes with her diabetic diet, then she may need  oral potassium supplementation.   By April 03, 2004 p.m., [name] had had another episode of chest pain,  but this resolved.  She has Imdur at home, and is advised to continue this  and use nitroglycerin sublingual p.r.n.  She is to follow up with Dr. Myrtis Ser,  as well as with Dr. Arlyce Dice.  She  is to recheck a BMET at her next lab draw  to follow both her sugars and her potassium level.  [name] is considered  stable for discharge on April 03, 2004 with outpatient follow up  arranged.   LABORATORY VALUES:  Hemoglobin 10.5, hematocrit 30.9, WBC 7.1, platelets  248.  Sodium 139, potassium 3.4, chloride 108, CO2 of 29, BUN 9, creatinine  0.9, glucose 136.  D-dimer 1.04.   CHEST X-RAY:  Heart is at the upper size of normal, but no active disease.   DISCHARGE CONDITION:  Stable.   DISCHARGE DIAGNOSIS:  1.  Chest pain.  Cardiolite negative for ischemia, and enzymes negative for      myocardial infarction this admission.  2.  History of coronary artery disease by catheterization in June of 2005      with a 70% circumflex, small vessel, and medical therapy recommended.  3.  Diabetes.  4.  Hypertension.  5.  Hyperlipidemia.  6.  Status post hysterectomy and cholecystectomy.  7.  History of moderate obstructive sleep apnea.  8.  Peripheral vascular disease.  9.  Family history of coronary artery disease.   DISCHARGE INSTRUCTIONS:  1.  Her activity level is to be as tolerated.  2.  She is to stick to a low-fat diabetic diet.  3.  She is to get a BMET at her next lab draw.  4.  She is to follow up with  Dr. Arlyce Dice.  5.  She is to follow up with Dr. Myrtis Ser on April 15, 2004 at 4 p.m.   DISCHARGE MEDICATIONS:  1.  Prilosec as prior to admission.  2.  Norvasc 5 mg daily.  3.  Lipitor 40 mg daily.  4.  Lopressor 50 mg one-half tablet b.i.d.  5.  Lisinopril 5 mg daily.  6.  Sublingual nitroglycerin p.r.n.  7.  Imdur 30 mg daily.  8.  HCTZ 25 mg daily.      Theodore Demark, P.A. [facility]                  Salvadore Farber, M.D. [facility]    RB/MEDQ  D:  04/03/2004  T:  04/04/2004  Job:  [id number]   cc:   Teena Irani. Arlyce Dice, M.D.  [address]  [place]  [place] [address]  Fax: [phone]   Willa Rough, M.D.

## 2010-12-11 NOTE — Cardiovascular Report (Signed)
NAMEANGENI, CHAUDHURI                ACCOUNT NO.:  [id number]   MEDICAL RECORD NO.:  [id number]          PATIENT TYPE:  INP   LOCATION:  3708                         FACILITY:  [facility]   PHYSICIAN:  Arvilla Meres, M.D. LHCDATE OF BIRTH:  07-23-1946   DATE OF PROCEDURE:  03/05/2005  DATE OF DISCHARGE:                              CARDIAC CATHETERIZATION   PRIMARY CARE PHYSICIAN:  Teena Irani. Arlyce Dice, M.D.   CARDIOLOGIST:  Dr. Jerral Bonito.   PATIENT IDENTIFICATION:  [name] is a 65 year old woman with a history of  hypertension and borderline diabetes who underwent cardiac catheterization  in 2005 for unstable angina.  She was found to have a  borderline 78% lesion  in the distal left circumflex which is been treated medically.  Most  recently she was admitted again with some chest pain after moving her  apartment.  She is ruled out for myocardial infarction.  The chest pain is  now resolved. She is referred for diagnostic left heart catheterization.   PROCEDURES PERFORMED:  1.  Selective coronary angiography.  2.  Left heart catheterization.  3.  Left ventriculogram.  4.  Abdominal aortogram.   DESCRIPTION OF THE PROCEDURES:  The risks and benefits of catheterization  explained to Ms. Spires.  Consent was signed and placed on the chart.  A 6-  [place] arterial sheath was placed in the right femoral artery using a  modified Seldinger technique.  Standard catheters including JL-4, JR-4 and  angled pigtail were used for the procedure.  All catheter exchanges were  made over a wire. At the end the procedure. the patient was transferred to  holding area in stable condition for removal of her vascular access. There  were no apparent complications.   FINDINGS:  1.  Central aortic pressure is 162/83 with a mean of 113.  LV pressure was      130/9 with an LVEDP of 19.  There was no gradient on aortic valve      pullback.  2.  Coronary anatomy.      1.  Left main: Was normal.      2.  The  LAD was a long vessel that coursed the apex.  It gave off a          large proximal first diagonal. There is no angiographic CAD.      3.  The left circumflex was a moderate size vessel.  It gave off a large          ramus and large OM-1 and a tiny OM-2.  There is a 30% lesion after          the OM-1.  Following the tiny OM-2, there was a 50% lesion in the          distal circumflex.  This did not appear flow-limiting and did appear          much improved since a catheterization in 2005.      4.  Right coronary artery: This is a large dominant vessel that gave off          a  large PDA and was a large PL.  There is a 20-30% tubular stenosis          in the proximal portion.  The mid section of the vessel was a          tortuous.  There was a 30% tubular stenosis.  The distal artery had          a 40% stenosis prior to the takeoff of the PDA and some mild diffuse          disease but no flow-limiting coronary disease.  3.  Left ventriculogram done in the RAO approach showed an ejection fraction      estimated 65%.  There were no wall motion abnormalities or mitral      regurgitation.  4.  Renal arteries were patent bilaterally.   ASSESSMENT:  1.  Nonobstructive two-vessel coronary disease.  2.  Lesion in left circumflex and distal left circumflex appears improved      since June 2005.  3.  Normal left ventricular function.  4.  No evidence of renal artery stenosis.   DISCUSSION:  Given Ms. Bedel's coronary anatomy, she apparently is having  noncardiac chest pain.  I have reviewed the catheterization with the films  with her.  She can likely be discharged home today after her bedrest.  We  will continue aggressive medical therapy including blood pressure control.  To that end, we will increase her Norvasc 10 mg a day and consideration  should be given to a diuretic as an outpatient.      Arvilla Meres, M.D. [facility]  Electronically Signed     DB/MEDQ  D:  03/05/2005  T:   03/05/2005  Job:  [id number]   cc:   Teena Irani. Arlyce Dice, M.D.  [address]  [place]  [place] [address]  Fax: [phone]   Willa Rough, M.D.  [address]  [address]  [place]  [place] [address]

## 2010-12-11 NOTE — H&P (Signed)
[name], Tammy [name]                ACCOUNT NO.:  [id number]   MEDICAL RECORD NO.:  [id number]          PATIENT TYPE:  INP   LOCATION:  1824                         FACILITY:  [facility]   PHYSICIAN:  [place] Bing, M.D. LHCDATE OF BIRTH:  [birth year]   DATE OF ADMISSION:  03/03/2005  DATE OF DISCHARGE:                                HISTORY & PHYSICAL   PHYSICAL EXAMINATION:  [facility].   PRIMARY CARE PHYSICIAN:  Dr. Dara Hoyer.   PRIMARY CARDIOLOGIST:  Dr. Willa Rough.   HISTORY OF PRESENT ILLNESS:  [name] 65 year old woman with known coronary disease  presents with progressive exertional angina. Tammy [name] history dates to  2004 when she was first admitted to [facility] with chest  discomfort. She was seen by [facility] and the pain was thought to  be atypical. An echocardiogram was obtained with plans for [name] stress nuclear  study. Optimal management of cardiovascular risk factors was advised. Due to  relative resting bradycardia on no medication, amlodipine was utilized  rather than [name] beta blocker. She apparently did well until June 2005 when she  was readmitted to hospital with sharp chest discomfort. Her chest pain was  felt to be somewhat atypical, but it was elected to proceed with cardiac  catheterization. She was found to have [name] 70% ostial circumflex lesion in [name]  small vessel. Medical therapy was advised. She returned again [name] few months  thereafter. [name] stress nuclear study was negative prompting continued medical  therapy. She has done well since then with occasional episodes of fairly  mild sharp chest discomfort for which she uses nitroglycerin. This past  week, she has noted progressive exertional symptoms with chest heaviness  localized over the left parasternal region and dyspnea, which has been quite  severe at times. She has tried nitroglycerin without much benefit. Symptoms  generally past after about 15 minutes of rest. The  pain is moderately  severe. There is no radiation. She has had some numbness in her hands. She  has had no nausea, diaphoresis nor emesis.   Tammy [name] has multiple cardiovascular risk factors including hypertension,  hyperlipidemia and borderline diabetes. She was [name] smoker in the past, but  quit some years ago. There is [name] family history of premature coronary artery  disease.   Past medical history is otherwise notable for [name] history of cholelithiasis,  gastritis and GERD. She has undergone cholecystectomy. She has [name] history of  CVA-it is not clear whether this was clinical or found on an MRI.   She has no known allergies. Recent medications have included HCTZ,  amlodipine, Lipitor and aspirin.   SOCIAL HISTORY:  Widowed; works as [name] Agricultural engineer; approximately [name] 20-  pack-year history of cigarette smoking in the past. No excessive use of  alcohol.   FAMILY HISTORY:  One daughter who is alive and well; nine siblings. No  prominent family history for coronary vascular disease. One sister has  diabetes.   REVIEW OF SYSTEMS:  Occasional headaches; generally overweight. Intermittent  discomfort in her lower extremities when she walks; occasional  wheezing. All  other systems reviewed and are negative.   PHYSICAL EXAMINATION:  GENERAL:  Pleasant woman in no acute distress.  VITAL SIGNS:  The blood pressure is 120/60, heart rate 60 and regular,  respirations 14, temperature 98.2.  HEENT: Anicteric sclerae; normal lids and conjunctiva. Normal oral mucosa.  NECK: No jugular venous distension; normal carotid upstrokes with [name] faint  right-sided bruits.  LUNGS: Clear.  CARDIAC: Normal first and second heart sounds; fourth heart sound present;  normal PMI.  ABDOMEN: Soft and nontender; normal bowel sounds; no organomegaly; no  masses.  EXTREMITIES: Normal distal pulses; no edema.  NEUROMUSCULAR: Symmetric strength and tone; normal cranial nerves.  MUSCULOSKELETAL: No joint  deformities.  SKIN: No significant lesions.  ENDOCRINE: Mild diffuse thyromegaly.  PSYCHIATRIC: Oriented; normal affect.   EKG: Normal sinus rhythm; borderline left atrial abnormality; minor  nonspecific T-wave abnormality; leftward axis. No significant change when  compared to [name] prior tracing of April 02, 2004. Other laboratories notable  for normal electrolytes, normal CBC, normal BUN and creatinine, and normal  initial cardiac markers. Coagulase studies are normal. BNP is less than 30.   IMPRESSION:  Tammy [name] presents with symptoms that are much more typical  for progressive angina than during prior hospitalizations. With known  coronary disease and very significant symptoms, I doubt that [name] stress test  would resolve all of her issues. I have recommended cardiac catheterization.  The risks and benefits were described. She agrees to proceed.   Since she has only exertional symptoms, intravenous heparin and  nitroglycerin will not be started. We will assess her response to low-dose  beta blocker and treat her with initial doses of clopidogrel. If she has  recurrent pain, intravenous nitroglycerin and heparin will be added as well  as [name] IIa IIIb antiplatelet agent.   Lipid control will be assessed. Due to presence of [name] carotid bruit, [name]  carotid ultrasound study will be obtained.      Friendly Bing, M.D. [facility]  Electronically Signed     RR/MEDQ  D:  03/03/2005  T:  03/04/2005  Job:  [id number]

## 2010-12-11 NOTE — Discharge Summary (Signed)
NAME:  [name], [name]                          ACCOUNT NO.:  [id number]   MEDICAL RECORD NO.:  [id number]                   PATIENT TYPE:  INP   LOCATION:  2025                                 FACILITY:  [facility]   PHYSICIAN:  Asencion Partridge, M.D.                  DATE OF BIRTH:  07-14-1946   DATE OF ADMISSION:  02/16/2003  DATE OF DISCHARGE:  02/19/2003                                 DISCHARGE SUMMARY   DISCHARGE DIAGNOSES:  1. Atypical chest pain.  2. Gastritis.  3. Hypertension.  4. Tobacco abuse.   PROCEDURES:  1. Chest x-ray x 2.  Initial chest x-ray showed evidence of atelectasis.     This was improved on the subsequent chest x-ray.  2. A 2-D echocardiogram showed ejection fraction of 55 to 65%.  No left     ventricular wall abnormalities.  Mitral annular calcification and left     atrial size in the upper limits of normal.  3. Cardiolite stress test showed ejection fraction of 60% with no ischemia.     No further cardiac workup recommended.   CONSULTATIONS:  Cardiology, Vida Roller, M.D.   HOSPITAL COURSE:  Upon admission, this 65 year old female, with a history of  hypertension and TIA, who was referred from [facility]  for chest pain that began two days prior to admission where she described an  epigastric, sharp, stabbing episode lasting 10 to 15 seconds with no  radiation, worse with deep inhalation and talking and mildly improved when  sitting upright, severe enough to make her lose her breath.  Denies nausea,  vomiting, cough.  No hematemesis.  No new symptoms.  She is a smoker with  unknown cholesterol.  No previous episodes like this but does complain of  frequent belching regardless of food type, not a classic heartburn or GERD  type presentation, no alcohol, no history of pancreatitis.   1. ATYPICAL CHEST PAIN:  Ms. Clemence underwent cardiac enzymes testing which     were negative.  Labs on admission included CBC with hemoglobin 12.5,  hematocrit 36.8, white count 10.2, platelets 298.  PT 13.3, INR 1.0, PTT     31.  Total bilirubin 0.6, alkaline phosphatase 106, AST 15, ALT 16, total     protein 8.3, albumin 3.3.  Chest x-ray showed bibasilar atelectasis.     Although her pain was alleviated with nitroglycerin, it was still felt     this was noncardiac pain, and cardiac workup further showed this was the     case.   1. GASTRITIS:  She was started on high-dose Protonix at 40 mg twice a day.     It is felt this is the etiology for her severe chest pain as it was     located primarily in the lower chest and epigastrium.  Protonix will be     continued as an outpatient for  two weeks, and she till take 1 tablet by     mouth daily for six weeks.  She should be followed by her primary medical     doctor regarding this gastritis.   1. HYPERTENSION:  Poorly controlled in hospital on hydrochlorothiazide and     metoprolol.  Her metoprolol was increased to 100 mg b.i.d.   1. TOBACCO ABUSE:  Ms. Scali expressed a desire to quit smoking and was     given a prescription for Zyban 150 mg 1 p.o. daily.  This should also be     followed by her primary medical doctor.  It was initially felt the     patient may have a pneumonia, so she was started on antibiotics.  Later,     it was felt that her chest x-ray was showing atelectasis due to splinting     secondary to pain with breathing.   LABORATORY DATA:  Upon discharge, her labs were Chem-7  sodium 139,  potassium 3.7, chloride 100, bicarb 29, BUN 11, creatinine 0.9, glucose 100,  calcium 9.3.  A fasting lipid profile showed total cholesterol at 181.  HDL  was slightly low at 25, LDL 112, triglycerides 220.  She had an H. pylori  antibody test drawn, but this test was still pending at  the time of  discharge.  This should be followed up by her primary doctor.   Serial EKGs showed nonspecific T wave changes.  This also should be  continued to be followed, although her adenosine  Cardiolite stress test was  normal.   DISPOSITION:  She is discharged on a sodium-reduced diet, and followup will  be with Teena Irani. Arlyce Dice, M.D. at [facility] on Wednesday,  August 4, at 1 p.m.      Ursula Beath, MD                     Asencion Partridge, M.D.    JT/MEDQ  D:  02/20/2003  T:  02/20/2003  Job:  [id number]   cc:   Teena Irani. Arlyce Dice, M.D.  [id number]  [place]  [place] [address]  Fax: [phone]   [facility]

## 2011-01-21 ENCOUNTER — Emergency Department (HOSPITAL_COMMUNITY)
Admission: EM | Admit: 2011-01-21 | Discharge: 2011-01-22 | Disposition: A | Discharge status: Home / Self Care | Payer: Medicare Other | Attending: Emergency Medicine | Admitting: Emergency Medicine

## 2011-01-21 DIAGNOSIS — I2510 Atherosclerotic heart disease of native coronary artery without angina pectoris: Secondary | ICD-10-CM | POA: Insufficient documentation

## 2011-01-21 DIAGNOSIS — R51 Headache: Secondary | ICD-10-CM | POA: Insufficient documentation

## 2011-01-21 DIAGNOSIS — E119 Type 2 diabetes mellitus without complications: Secondary | ICD-10-CM | POA: Insufficient documentation

## 2011-01-21 DIAGNOSIS — G319 Degenerative disease of nervous system, unspecified: Secondary | ICD-10-CM | POA: Insufficient documentation

## 2011-01-21 DIAGNOSIS — E785 Hyperlipidemia, unspecified: Secondary | ICD-10-CM | POA: Insufficient documentation

## 2011-01-21 DIAGNOSIS — I10 Essential (primary) hypertension: Secondary | ICD-10-CM | POA: Insufficient documentation

## 2011-01-21 DIAGNOSIS — K219 Gastro-esophageal reflux disease without esophagitis: Secondary | ICD-10-CM | POA: Insufficient documentation

## 2011-01-22 ENCOUNTER — Emergency Department (HOSPITAL_COMMUNITY): Payer: Medicare Other

## 2011-01-22 LAB — BASIC METABOLIC PANEL
BUN: 10 mg/dL (ref 6–23)
CO2: 30 mEq/L (ref 19–32)
Chloride: 102 mEq/L (ref 96–112)
GFR calc non Af Amer: >60 mL/min (ref >60)
Glucose, Bld: 119 mg/dL — ABNORMAL HIGH (ref 70–99)
Potassium: 3 mEq/L — ABNORMAL LOW (ref 3.5–5.1)
Sodium: 139 mEq/L (ref 135–145)

## 2011-01-22 LAB — CBC
HCT: 34.7 % — ABNORMAL LOW (ref 36.0–46.0)
Hemoglobin: 11.5 g/dL — ABNORMAL LOW (ref 12.0–15.0)
RBC: 4.15 MIL/uL (ref 3.87–5.11)

## 2011-01-22 LAB — DIFFERENTIAL
Basophils Absolute: 0 10*3/uL (ref 0.0–0.1)
Basophils Relative: 0 % (ref 0–1)
Lymphocytes Relative: 25 % (ref 12–46)
Neutro Abs: 3.7 10*3/uL (ref 1.7–7.7)

## 2011-02-10 ENCOUNTER — Encounter: Payer: Self-pay | Admitting: Vascular Surgery

## 2011-02-11 ENCOUNTER — Ambulatory Visit: Payer: Medicare Other | Admitting: Vascular Surgery

## 2011-02-11 ENCOUNTER — Other Ambulatory Visit: Payer: Medicare Other

## 2011-03-19 ENCOUNTER — Encounter: Payer: Self-pay | Admitting: Vascular Surgery

## 2011-04-29 ENCOUNTER — Ambulatory Visit: Payer: Self-pay | Admitting: Vascular Surgery

## 2011-05-06 ENCOUNTER — Telehealth: Payer: Self-pay | Admitting: Cardiology

## 2011-05-06 NOTE — Telephone Encounter (Signed)
OV Note faxed to [phone]  05/06/11/km °

## 2011-05-07 LAB — COMPREHENSIVE METABOLIC PANEL
AST: 19
Alkaline Phosphatase: 96
BUN: 17
CO2: 28
Chloride: 106
Creatinine, Ser: 0.72
GFR calc Af Amer: >60
GFR calc non Af Amer: >60
Potassium: 3.9
Total Bilirubin: 1.1

## 2011-05-07 LAB — CBC
HCT: 35.4 — ABNORMAL LOW
MCV: 85.4
RBC: 4.15
WBC: 5.2

## 2011-05-07 LAB — APTT: aPTT: 27

## 2011-05-20 ENCOUNTER — Ambulatory Visit (INDEPENDENT_AMBULATORY_CARE_PROVIDER_SITE_OTHER): Payer: Medicare Other | Admitting: *Deleted

## 2011-05-20 ENCOUNTER — Encounter: Payer: Self-pay | Admitting: Vascular Surgery

## 2011-05-20 ENCOUNTER — Ambulatory Visit (INDEPENDENT_AMBULATORY_CARE_PROVIDER_SITE_OTHER): Payer: Medicare Other | Admitting: Vascular Surgery

## 2011-05-20 VITALS — BP 138/81 | HR 63 | Temp 96.8°F | Ht 62.5 in | Wt 204.0 lb

## 2011-05-20 DIAGNOSIS — I739 Peripheral vascular disease, unspecified: Secondary | ICD-10-CM

## 2011-05-20 DIAGNOSIS — I714 Abdominal aortic aneurysm, without rupture: Secondary | ICD-10-CM

## 2011-05-20 NOTE — Progress Notes (Signed)
[facility] [place] HISTORY AND PHYSICAL   The patient returns for followup today. She was last seen in July  of 2010. We have been following her for bilateral lower extremity  claudication symptoms. She continues to refrain from smoking. She  still has claudication symptoms that occur at approximately one block.  She does not feel that she is completely limited by her claudication symptoms currently but does have some limitation.  She is also limited by dyspnea with exertion.  Her other atherosclerotic risk factors include hypertension, coronary  artery disease and diabetes.These are currently stable and followed by her primary MD.  The patient denies rest pain or ulcers on the feet. She has also lost 20 pounds in the last year I congratulated her on this.  Past Medical History  Diagnosis Date  . Atypical face pain   . Hypertension   . CAD (coronary artery disease)   . GERD (gastroesophageal reflux disease)   . Headache   . Hyperlipidemia   . Arthritis   . Sleep apnea   . Diabetes mellitus     Past Surgical History  Procedure Date  . Cesarean section   . Abdominal hysterectomy     Review of Systems:  Neurologic: sensation in the feet is intact Cardiac:denies shortness of breath or chest pain Pulmonary: denies cough or wheeze  Social History History  Substance Use Topics  . Smoking status: Former Games developer  . Smokeless tobacco: Not on file  . Alcohol Use: Yes     Social Drinker only    Allergies  Allergies  Allergen Reactions  . Amoxicillin      Current Outpatient Prescriptions  Medication Sig Dispense Refill  . AmLODIPine Besylate (NORVASC PO) Take by mouth.        Marland Kitchen aspirin 325 MG tablet Take 325 mg by mouth daily.        . citalopram (CELEXA) 20 MG tablet Take 20 mg by mouth daily.        . diazepam (VALIUM) 5 MG tablet Take 5 mg by mouth every 6 (six) hours as needed.        . Furosemide (LASIX PO) Take 40 mg by mouth daily.       Marland Kitchen  HYDROcodone-acetaminophen (NORCO) 5-325 MG per tablet Take 1 tablet by mouth every 6 (six) hours as needed.        Marland Kitchen LISINOPRIL PO Take 40 mg by mouth daily.       . MELOXICAM PO Take by mouth.        . METFORMIN HCL PO Take 1,000 mg by mouth 2 (two) times daily with a meal.       . Metoprolol Tartrate (LOPRESSOR PO) Take 100 mg by mouth 2 (two) times daily.       . nitroGLYCERIN (NITROSTAT) 0.4 MG SL tablet Place 0.4 mg under the tongue every 5 (five) minutes as needed.        . OXYBUTYNIN CHLORIDE PO Take 5 mg by mouth daily.       . pioglitazone-metformin (ACTOPLUS MET) 15-850 MG per tablet Take 1 tablet by mouth 2 (two) times daily with a meal.        . POTASSIUM CHLORIDE CR PO Take 20 mEq by mouth daily.        Marland Kitchen SIMVASTATIN PO Take 40 mg by mouth. 1/2 tablet daily      . triamcinolone (KENALOG) 0.1 % cream Apply 1 application topically 2 (two) times daily.        Marland Kitchen  predniSONE (DELTASONE) 20 MG tablet Take 20 mg by mouth daily. Take 2 tabs twice daily        . zolpidem (AMBIEN) 10 MG tablet         Physical Examination  Filed Vitals:   05/20/11 1024  BP: 138/81  Pulse: 63  Temp: 96.8 F (36 C)  TempSrc: Oral  Height: 5' 2.5" (1.588 m)  Weight: 204 lb (92.534 kg)  SpO2: 98%    Body mass index is 36.72 kg/(m^2).  General:  Alert and oriented, no acute distress HEENT: Normal Neck: No bruit or JVD Pulmonary: Clear to auscultation bilaterally Cardiac: Regular Rate and Rhythm without murmur Neurologic: Upper and lower extremity motor 5/5 and symmetric Extremities:  2+ femoral pulses with absent popliteal and pedal pulses Skin: no ulcer or rash  DATA: Bilateral ABIs were reviewed and interpreted today. ABI on the right side was 0.65 left was 0.56 she had monophasic waveforms. This is consistent with both of her ABIs previously in 2000 and 2010.   ASSESSMENT: Stable claudication bilateral lower extremities with a known history of peripheral arterial disease. She has known  bilateral superficial femoral artery occlusions with one-vessel runoff via peroneal artery. This was seen on arteriogram previously.   PLAN: #1 the patient will continue to try to establish a walking program of 30 minutes daily.  #2 she will continue to try risk factor modification of her other risk factors  #3 she will return for followup ABIs and office evaluation in one year. She will return sooner she develops ulcerations of nonhealing wounds on the foot.  Fabienne Bruns, MD [facility] [place] Office: [phone] Pager: [phone]  She had repeat ABIs today which were 0.56 on the left and 0.65 on the  right. These are fairly similar to her ABIs in 2008 and 2010 The patient's claudication symptoms overall are similar over the past  year. I do not believe she needs any intervention at this point. Her  previous arteriogram showed bilateral SFA occlusions and moderate tibial  disease. I would not consider performing a revascularization procedure  for claudication symptoms alone unless she was very debilitated by these  as she has primarily only single vessel runoff.

## 2011-06-09 ENCOUNTER — Encounter: Payer: Self-pay | Admitting: Cardiology

## 2011-06-09 DIAGNOSIS — I739 Peripheral vascular disease, unspecified: Secondary | ICD-10-CM | POA: Insufficient documentation

## 2011-06-09 DIAGNOSIS — I2510 Atherosclerotic heart disease of native coronary artery without angina pectoris: Secondary | ICD-10-CM | POA: Insufficient documentation

## 2011-06-09 DIAGNOSIS — G4730 Sleep apnea, unspecified: Secondary | ICD-10-CM | POA: Insufficient documentation

## 2011-06-09 DIAGNOSIS — R9430 Abnormal result of cardiovascular function study, unspecified: Secondary | ICD-10-CM | POA: Insufficient documentation

## 2011-06-09 DIAGNOSIS — K219 Gastro-esophageal reflux disease without esophagitis: Secondary | ICD-10-CM | POA: Insufficient documentation

## 2011-06-09 DIAGNOSIS — I340 Nonrheumatic mitral (valve) insufficiency: Secondary | ICD-10-CM | POA: Insufficient documentation

## 2011-06-10 ENCOUNTER — Encounter: Payer: Self-pay | Admitting: Cardiology

## 2011-06-10 ENCOUNTER — Ambulatory Visit (INDEPENDENT_AMBULATORY_CARE_PROVIDER_SITE_OTHER): Payer: Medicare Other | Admitting: Cardiology

## 2011-06-10 DIAGNOSIS — E8779 Other fluid overload: Secondary | ICD-10-CM

## 2011-06-10 DIAGNOSIS — M79609 Pain in unspecified limb: Secondary | ICD-10-CM

## 2011-06-10 DIAGNOSIS — I10 Essential (primary) hypertension: Secondary | ICD-10-CM

## 2011-06-10 DIAGNOSIS — M79671 Pain in right foot: Secondary | ICD-10-CM | POA: Insufficient documentation

## 2011-06-10 DIAGNOSIS — I2510 Atherosclerotic heart disease of native coronary artery without angina pectoris: Secondary | ICD-10-CM

## 2011-06-10 MED ORDER — FUROSEMIDE 40 MG PO TABS: 40.0000 mg | ORAL_TABLET | Freq: Two times a day (BID) | ORAL | Status: DC

## 2011-06-10 MED ORDER — NITROGLYCERIN 0.4 MG SL SUBL: 0.4000 mg | SUBLINGUAL_TABLET | SUBLINGUAL | Status: DC | PRN

## 2011-06-10 NOTE — Patient Instructions (Signed)
Your physician recommends that you schedule a follow-up appointment in: 12 months with Dr Myrtis Ser Your physician has recommended you make the following change in your medication: START Furosemide 40 mg twice daily

## 2011-06-10 NOTE — Assessment & Plan Note (Signed)
The patient is off her diuretic.  It needs to be restarted for her fluid management.  We'll see her back in one year in followup.

## 2011-06-10 NOTE — Assessment & Plan Note (Signed)
Patient has some pain in the right foot.  This does not appear to be vascular in origin.  She wonders if she could have gout.  I've encouraged followup with her primary physician.

## 2011-06-10 NOTE — Progress Notes (Signed)
HPI   Patient is seen today for cardiology followup.  I saw her last July, 2011.  She does have a history of mild two-vessel coronary disease.  There is a history of normal left ventricular function.  Her Lasix could not be refilled several weeks ago.  She's had some increased shortness of breath.  She's not having any chest pain.  As part of today's evaluation I have reviewed all of her old records and completely updated the new electronic medical record.  Allergies  Allergen Reactions  . Amoxicillin     Current Outpatient Prescriptions  Medication Sig Dispense Refill  . aspirin 325 MG tablet Take 325 mg by mouth daily.        . citalopram (CELEXA) 20 MG tablet Take 20 mg by mouth daily.        . diazepam (VALIUM) 5 MG tablet Take 5 mg by mouth every 6 (six) hours as needed.        Marland Kitchen LISINOPRIL PO Take 40 mg by mouth daily.       Marland Kitchen METFORMIN HCL PO Take 1,000 mg by mouth 2 (two) times daily with a meal.       . Metoprolol Tartrate (LOPRESSOR PO) Take 100 mg by mouth 2 (two) times daily.       . nitroGLYCERIN (NITROSTAT) 0.4 MG SL tablet Place 0.4 mg under the tongue every 5 (five) minutes as needed.        Marland Kitchen POTASSIUM CHLORIDE CR PO Take 20 mEq by mouth daily.        Marland Kitchen SIMVASTATIN PO Take 40 mg by mouth. 1/2 tablet daily      . triamcinolone (KENALOG) 0.1 % cream Apply 1 application topically 2 (two) times daily.        Marland Kitchen zolpidem (AMBIEN) 10 MG tablet         History   Social History  . Marital Status: Widowed    Spouse Name: N/A    Number of Children: N/A  . Years of Education: N/A   Occupational History  . Not on file.   Social History Main Topics  . Smoking status: Former Games developer  . Smokeless tobacco: Not on file  . Alcohol Use: Yes     Social Drinker only  . Drug Use: No  . Sexually Active:    Other Topics Concern  . Not on file   Social History Narrative  . No narrative on file    No family history on file.  Past Medical History  Diagnosis Date  . Atypical  face pain   . Hypertension   . CAD (coronary artery disease)     Catheterization 2006, mild two-vessel nonobstructive disease  . GERD (gastroesophageal reflux disease)   . Headache   . Hyperlipidemia   . Arthritis   . Sleep apnea     CPAP compliance ???  . Diabetes mellitus   . Shortness of breath   . PAD (peripheral artery disease)     Dr. Darrick Penna,  Bilateral superficial femoral artery occlusions with one vessel runoff via the peroneal artery  . Ejection fraction     EF 60%, echo, October, 2010,  . Urinary incontinence   . Overweight   . Mitral regurgitation     Mild, echo, October, 2010  . Fluid overload     Past Surgical History  Procedure Date  . Cesarean section   . Abdominal hysterectomy     ROS  Patient denies fever, chills, headache, sweats, rash, change in  vision, change in hearing, chest pain, cough, nausea vomiting, urinary symptoms.  All other systems are reviewed and are negative.  PHYSICAL EXAM    Patient is overweight.  She is oriented to person time and place.  Affect is normal.  Head is atraumatic.  There is no xanthelasma.  There is no jugular venous distention.  Lungs are clear.  Respiratory effort is not labored.  Cardiac exam reveals an S1-S2.  No clicks or significant murmurs.  The abdomen is soft.  There is trace peripheral edema.  There are no musculoskeletal deformities.  No skin rashes.  Filed Vitals:   06/10/11 1029  BP: 118/82  Pulse: 67  Height: 5\' 2"  (1.575 m)  Weight: 208 lb (94.348 kg)    EKG EKG is done today and reviewed by me.  There are minor nonspecific ST-T wave changes. ASSESSMENT & PLAN

## 2011-06-10 NOTE — Assessment & Plan Note (Signed)
Coronary disease is stable.  She does not need any further workup at this time.

## 2011-06-10 NOTE — Assessment & Plan Note (Signed)
Blood pressure is controlled. No change in therapy. 

## 2011-10-30 ENCOUNTER — Ambulatory Visit (INDEPENDENT_AMBULATORY_CARE_PROVIDER_SITE_OTHER): Payer: Medicare Other | Admitting: Family Medicine

## 2011-10-30 VITALS — BP 119/70 | HR 57 | Temp 97.8°F | Resp 18 | Ht 62.5 in | Wt 207.0 lb

## 2011-10-30 DIAGNOSIS — M778 Other enthesopathies, not elsewhere classified: Secondary | ICD-10-CM

## 2011-10-30 DIAGNOSIS — M65839 Other synovitis and tenosynovitis, unspecified forearm: Secondary | ICD-10-CM

## 2011-10-30 DIAGNOSIS — M25529 Pain in unspecified elbow: Secondary | ICD-10-CM

## 2011-10-30 NOTE — Progress Notes (Signed)
Patient Name: [name] Date of Birth: Jul 31, 1945 Medical Record Number: [id number] Gender: female Date of Encounter: 10/30/2011  History of Present Illness:  [name] is a 66 y.o. very pleasant female patient who presents with the following:  Here today with pain in her right arm and wrist.  She fell in February and hurt her arm- she has since fallen twice more.  [name] states that she has gotten dizzy and fallen, but that her PCP is working on helping her with this. Her PCP is Dr. Creta Levin.  She just went there on Tuesday- she has had xrays since her most recent fall which were negative per her report. She notes that she was told to follow- up at the beginning of May and that she would be referred to ortho if her symptoms persist.  She is here today because she would like to be referred sooner- she did not ask her PCP for a referral directly  Patient Active Problem List  Diagnoses  . DM  . DYSLIPIDEMIA  . FLUID OVERLOAD  . HYPOKALEMIA  . OVERWEIGHT  . HYPERTENSION  . SHORTNESS OF BREATH  . CAD (coronary artery disease)  . GERD (gastroesophageal reflux disease)  . Sleep apnea  . PAD (peripheral artery disease)  . Ejection fraction  . Mitral regurgitation  . Foot pain, right   Past Medical History  Diagnosis Date  . Atypical face pain   . Hypertension   . CAD (coronary artery disease)     Catheterization 2006, mild two-vessel nonobstructive disease  . GERD (gastroesophageal reflux disease)   . Headache   . Hyperlipidemia   . Arthritis   . Sleep apnea     CPAP compliance ???  . Diabetes mellitus   . Shortness of breath   . PAD (peripheral artery disease)     Dr. Darrick Penna,  Bilateral superficial femoral artery occlusions with one vessel runoff via the peroneal artery  . Ejection fraction     EF 60%, echo, October, 2010,  . Urinary incontinence   . Overweight   . Mitral regurgitation     Mild, echo, October, 2010  . Fluid overload   . Foot pain, right    November, 2012   Past Surgical History  Procedure Date  . Cesarean section   . Abdominal hysterectomy    History  Substance Use Topics  . Smoking status: Former Smoker    Types: Cigarettes    Quit date: 11/13/2010  . Smokeless tobacco: Not on file  . Alcohol Use: Yes     Social Drinker only/////////STOP DRINKING IN 2001   No family history on file. Allergies  Allergen Reactions  . Amoxicillin     Medication list has been reviewed and updated.  Review of Systems: As per HPI- otherwise negative.   Physical Examination: Filed Vitals:   10/30/11 1622  BP: 119/70  Pulse: 57  Temp: 97.8 F (36.6 C)  TempSrc: Oral  Resp: 18  Height: 5' 2.5" (1.588 m)  Weight: 207 lb (93.895 kg)    Body mass index is 37.26 kg/(m^2).  GEN: WDWN, NAD, Non-toxic, A & O x 3, obese HEENT: Atraumatic, Normocephalic. Neck supple. No masses, No LAD. Ears and Nose: No external deformity. CV: RRR, No M/G/R. No JVD. No thrill. No extra heart sounds. PULM: CTA B, no wheezes, crackles, rhonchi. No retractions. No resp. distress. No accessory muscle use. 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 She also has some tenderness over  her lateral epicondyle consistent with lateral epicondylitis.  There is no heat or swelling except minimally over thumb tendon.  Normal ROM at elbow, wrist and hand joints.   EXTR: No c/c/e NEURO Normal gait.  PSYCH: Normally interactive. Conversant. Not depressed or anxious appearing.  Calm demeanor.    Assessment and Plan: 1. Tendonitis of wrist, right   2. Pain, elbow    Suspect tendonitis at thumb and elbow as above.  Will go ahead and refer her to ortho.  Gave a thumb spica splint to use for her thumb for symptomatic relief- also may use ice and NSAIDs as needed in the meantime.

## 2011-11-16 ENCOUNTER — Ambulatory Visit: Payer: Medicare Other

## 2011-11-16 ENCOUNTER — Ambulatory Visit (INDEPENDENT_AMBULATORY_CARE_PROVIDER_SITE_OTHER): Payer: Medicare Other | Admitting: Family Medicine

## 2011-11-16 VITALS — BP 153/79 | HR 67 | Temp 98.0°F | Resp 16

## 2011-11-16 DIAGNOSIS — M79672 Pain in left foot: Secondary | ICD-10-CM

## 2011-11-16 DIAGNOSIS — M79609 Pain in unspecified limb: Secondary | ICD-10-CM

## 2011-11-16 MED ORDER — INDOMETHACIN 50 MG PO CAPS: 50.0000 mg | ORAL_CAPSULE | Freq: Three times a day (TID) | ORAL | Status: DC

## 2011-11-16 MED ORDER — COLCHICINE 0.6 MG PO TABS: ORAL_TABLET | ORAL | Status: DC

## 2011-11-16 NOTE — Progress Notes (Signed)
Patient Name: [name] Date of Birth: Aug 02, 1945 Medical Record Number: [id number] Gender: female Date of Encounter: 11/16/2011  History of Present Illness:  [name] is a 66 y.o. very pleasant female patient who presents with the following:  Woke up yesterday and stepped down onto the floor and had severe left foot pain.  No known injury.  Never had this before.  No known history of gout in the past.  She notes that he foot is swollen and very painful.  She has not tried any medications for her foot as of yet.  She is using a cane which she borrowed from a friend. Otherwise she feels ok today  Patient Active Problem List  Diagnoses  . DM  . DYSLIPIDEMIA  . FLUID OVERLOAD  . HYPOKALEMIA  . OVERWEIGHT  . HYPERTENSION  . SHORTNESS OF BREATH  . CAD (coronary artery disease)  . GERD (gastroesophageal reflux disease)  . Sleep apnea  . PAD (peripheral artery disease)  . Ejection fraction  . Mitral regurgitation  . Foot pain, right   Past Medical History  Diagnosis Date  . Atypical face pain   . Hypertension   . CAD (coronary artery disease)     Catheterization 2006, mild two-vessel nonobstructive disease  . GERD (gastroesophageal reflux disease)   . Headache   . Hyperlipidemia   . Arthritis   . Sleep apnea     CPAP compliance ???  . Diabetes mellitus   . Shortness of breath   . PAD (peripheral artery disease)     Dr. Darrick Penna,  Bilateral superficial femoral artery occlusions with one vessel runoff via the peroneal artery  . Ejection fraction     EF 60%, echo, October, 2010,  . Urinary incontinence   . Overweight   . Mitral regurgitation     Mild, echo, October, 2010  . Fluid overload   . Foot pain, right     November, 2012   Past Surgical History  Procedure Date  . Cesarean section   . Abdominal hysterectomy    History  Substance Use Topics  . Smoking status: Former Smoker    Types: Cigarettes    Quit date: 11/13/2010  . Smokeless tobacco: Not on  file  . Alcohol Use: Yes     Social Drinker only/////////STOP DRINKING IN 2001   No family history on file. Allergies  Allergen Reactions  . Amoxicillin     Medication list has been reviewed and updated.  Review of Systems: As per HPI- otherwise negative.  Physical Examination: Filed Vitals:   11/16/11 1314  BP: 153/79  Pulse: 67  Temp: 98 F (36.7 C)  TempSrc: Oral  Resp: 16    There is no height or weight on file to calculate BMI.   GEN: WDWN, NAD, Non-toxic, Alert & Oriented x 3, overweight HEENT: Atraumatic, Normocephalic.  Ears and Nose: No external deformity. EXTR: No clubbing/cyanosis/edema NEURO: antalgic gait PSYCH: Normally interactive. Conversant. Not depressed or anxious appearing.  Calm demeanor.  Left foot:  Mildly swollen and very tender across the dorsum of the foot, especially first MTP joint.  No redness.  Tenderness is typical of gout.  Normal DP pulse  [facility] reading (PRIMARY) by  Dr. Patsy Lager.  Negative foot  Assessment and Plan: 1. Pain in left foot  DG Foot 2 Views Left, Uric Acid, colchicine 0.6 MG tablet, indomethacin (INDOCIN) 50 MG capsule  2. Gout     Suspect that [name] has gout, as she has severe foot pain  without any known injury.  Xrays look ok.  Post- op shoe for support and gave rx for a walker. Await uric acid level, colchicine and indocin as needed.  Let us know if getting worse or if not better in a couple of days.

## 2011-12-22 ENCOUNTER — Encounter: Payer: Self-pay | Admitting: Vascular Surgery

## 2011-12-23 ENCOUNTER — Other Ambulatory Visit (INDEPENDENT_AMBULATORY_CARE_PROVIDER_SITE_OTHER): Payer: Medicare Other | Admitting: *Deleted

## 2011-12-23 ENCOUNTER — Encounter (HOSPITAL_COMMUNITY): Payer: Self-pay | Admitting: Pharmacy Technician

## 2011-12-23 ENCOUNTER — Other Ambulatory Visit: Payer: Self-pay

## 2011-12-23 ENCOUNTER — Encounter: Payer: Self-pay | Admitting: Vascular Surgery

## 2011-12-23 ENCOUNTER — Ambulatory Visit (INDEPENDENT_AMBULATORY_CARE_PROVIDER_SITE_OTHER): Payer: Medicare Other | Admitting: Vascular Surgery

## 2011-12-23 VITALS — BP 118/59 | HR 64 | Temp 98.2°F | Ht 62.5 in | Wt 201.0 lb

## 2011-12-23 DIAGNOSIS — M316 Other giant cell arteritis: Secondary | ICD-10-CM

## 2011-12-23 DIAGNOSIS — R55 Syncope and collapse: Secondary | ICD-10-CM

## 2011-12-23 NOTE — Progress Notes (Signed)
[facility] [place] HISTORY AND PHYSICAL   History of Present Illness:  Patient is a 65 y.o. year old female who presents for evaluation of syncope, dizziness and left-sided headaches.  The patient has had 3 syncopal episodes since February. She has been seen by neurology and a temporal artery biopsy was recommended. 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 She has a history of an elevated sedimentation rate and has been placed on steroids. She has had previous evaluation with a head CT which showed no evidence of intracranial bleed or stroke. She apparently had a "normal EKG". The patient is known to me for prior evaluation for peripheral arterial disease. Her claudication symptoms are stable. Other medical problems include hyperlipidemia, arthritis, sleep apnea, hypertension, diabetes, coronary artery disease. These are all currently stable.  Past Medical History  Diagnosis Date  . Atypical face pain   . Hypertension   . CAD (coronary artery disease)     Catheterization 2006, mild two-vessel nonobstructive disease  . GERD (gastroesophageal reflux disease)   . Headache   . Hyperlipidemia   . Arthritis   . Sleep apnea     CPAP compliance ???  . Diabetes mellitus   . Shortness of breath   . PAD (peripheral artery disease)     Dr. Estefanie Cornforth,  Bilateral superficial femoral artery occlusions with one vessel runoff via the peroneal artery  . Ejection fraction     EF 60%, echo, October, 2010,  . Urinary incontinence   . Overweight   . Mitral regurgitation     Mild, echo, October, 2010  . Fluid overload   . Foot pain, right     November, 2012  . Stroke     Past Surgical History  Procedure Date  . Cesarean section   . Abdominal hysterectomy   . Eye surgery 12/11/10  . Vesicovaginal fistula closure w/ tah      Social History History  Substance Use Topics  . Smoking status: Former Smoker    Types: Cigarettes    Quit date: 11/13/2010  . Smokeless  tobacco: Not on file  . Alcohol Use: Yes     Social Drinker only/////////STOP DRINKING IN 2001    Family History Family History  Problem Relation Age of Onset  . Diabetes Mother   . Hyperlipidemia Mother   . Hypertension Mother   . Heart disease Father   . Diabetes Sister   . Hypertension Sister   . Hyperlipidemia Sister     Allergies  Allergies  Allergen Reactions  . Amoxicillin      Current Outpatient Prescriptions  Medication Sig Dispense Refill  . acetaminophen-codeine (TYLENOL #3) 300-30 MG per tablet Take by mouth every 6 (six) hours as needed.      . aspirin 325 MG tablet Take 325 mg by mouth daily.        . citalopram (CELEXA) 20 MG tablet Take 20 mg by mouth daily.        . colchicine 0.6 MG tablet Take two pills once, then take another an hour later.  Can repeat for future gout attacks  30 tablet  0  . furosemide (LASIX) 40 MG tablet Take 1 tablet (40 mg total) by mouth 2 (two) times daily.  60 tablet  11  . gabapentin (NEURONTIN) 300 MG capsule Take 300 mg by mouth daily.      . indomethacin (INDOCIN) 50 MG capsule Take 1 capsule (50 mg total) by mouth 3 (three) times daily with meals.    40 capsule  0  . METFORMIN HCL PO Take 1,000 mg by mouth 2 (two) times daily with a meal.       . Metoprolol Tartrate (LOPRESSOR PO) Take 1,000 mg by mouth 2 (two) times daily.       . nitroGLYCERIN (NITROSTAT) 0.4 MG SL tablet Place 1 tablet (0.4 mg total) under the tongue every 5 (five) minutes as needed.  25 tablet  6  . Olmesartan-Amlodipine-HCTZ (TRIBENZOR) 40-5-25 MG TABS Take by mouth daily.      . POTASSIUM CHLORIDE CR PO Take 20 mEq by mouth daily.        . traMADol (ULTRAM) 50 MG tablet Take 50 mg by mouth as needed. 1-2 tablets      . triamcinolone (KENALOG) 0.1 % cream Apply 1 application topically 2 (two) times daily.        . zolpidem (AMBIEN) 10 MG tablet       . diazepam (VALIUM) 5 MG tablet Take 5 mg by mouth every 6 (six) hours as needed.        . LISINOPRIL PO  Take 40 mg by mouth daily.       . SIMVASTATIN PO Take 40 mg by mouth. 1/2 tablet daily        ROS:   General:  No weight loss, Fever, chills  HEENT: + recent headaches, no nasal bleeding, no visual changes, no sore throat  Neurologic: + dizziness,+  blackouts, seizures. No recent symptoms of stroke or mini- stroke. No recent episodes of slurred speech, or temporary blindness.  Cardiac: No recent episodes of chest pain/pressure, no shortness of breath at rest.  + shortness of breath with exertion.  Denies history of atrial fibrillation or irregular heartbeat  Vascular: No history of rest pain in feet.  + history of claudication.  No history of non-healing ulcer, No history of DVT   Physical Examination  Filed Vitals:   12/23/11 0922  BP: 118/59  Pulse: 64  Temp: 98.2 F (36.8 C)  TempSrc: Oral  Height: 5' 2.5" (1.588 m)  Weight: 201 lb (91.173 kg)  SpO2: 99%    Body mass index is 36.18 kg/(m^2).  General:  Alert and oriented, no acute distress HEENT: Normal Neck: No bruit or JVD Pulmonary: Clear to auscultation bilaterally Cardiac: Regular Rate and Rhythm without murmur Skin: No rash Extremity Pulses:  2+ radial, brachial, femoral Musculoskeletal: No deformity or edema  Neurologic: Upper and lower extremity motor 5/5 and symmetric  DATA: Patient carotid duplex exam today which I reviewed and interpreted. This showed less than 40% carotid stenosis bilaterally.   ASSESSMENT:   Possible temporal arteritis   PLAN:  Left temporal artery biopsy on Monday, June 3.. Risks benefits possible complications and procedure details were explained the patient today. She agrees to proceed.  Tre Sanker, MD [facility] Office: [phone] Pager: [phone]  

## 2011-12-24 ENCOUNTER — Encounter (HOSPITAL_COMMUNITY): Payer: Self-pay | Admitting: *Deleted

## 2011-12-26 MED ORDER — VANCOMYCIN HCL 1000 MG IV SOLR
1500.0000 mg | INTRAVENOUS | Status: DC
  Filled 2011-12-26: qty 1500

## 2011-12-26 MED ORDER — VANCOMYCIN HCL IN DEXTROSE 1-5 GM/200ML-% IV SOLN: 1000.0000 mg | INTRAVENOUS | Status: DC

## 2011-12-27 ENCOUNTER — Encounter (HOSPITAL_COMMUNITY): Payer: Self-pay | Admitting: Certified Registered Nurse Anesthetist

## 2011-12-27 ENCOUNTER — Ambulatory Visit (HOSPITAL_COMMUNITY): Payer: Medicare Other | Admitting: Certified Registered Nurse Anesthetist

## 2011-12-27 ENCOUNTER — Ambulatory Visit (HOSPITAL_COMMUNITY): Payer: Medicare Other

## 2011-12-27 ENCOUNTER — Ambulatory Visit (HOSPITAL_COMMUNITY)
Admission: RE | Admit: 2011-12-27 | Discharge: 2011-12-27 | Disposition: A | Discharge status: Home / Self Care | Payer: Medicare Other | Source: Ambulatory Visit | Attending: Vascular Surgery | Admitting: Vascular Surgery

## 2011-12-27 ENCOUNTER — Encounter (HOSPITAL_COMMUNITY): Payer: Self-pay | Admitting: Surgery

## 2011-12-27 ENCOUNTER — Encounter (HOSPITAL_COMMUNITY)
Admission: RE | Disposition: A | Discharge status: Home / Self Care | Payer: Self-pay | Source: Ambulatory Visit | Attending: Vascular Surgery

## 2011-12-27 DIAGNOSIS — E119 Type 2 diabetes mellitus without complications: Secondary | ICD-10-CM | POA: Insufficient documentation

## 2011-12-27 DIAGNOSIS — M316 Other giant cell arteritis: Secondary | ICD-10-CM

## 2011-12-27 DIAGNOSIS — K219 Gastro-esophageal reflux disease without esophagitis: Secondary | ICD-10-CM | POA: Insufficient documentation

## 2011-12-27 DIAGNOSIS — I10 Essential (primary) hypertension: Secondary | ICD-10-CM | POA: Insufficient documentation

## 2011-12-27 DIAGNOSIS — I2510 Atherosclerotic heart disease of native coronary artery without angina pectoris: Secondary | ICD-10-CM | POA: Insufficient documentation

## 2011-12-27 DIAGNOSIS — R51 Headache: Secondary | ICD-10-CM | POA: Insufficient documentation

## 2011-12-27 HISTORY — PX: ARTERY BIOPSY: SHX891

## 2011-12-27 LAB — BASIC METABOLIC PANEL
CO2: 29 mEq/L (ref 19–32)
Chloride: 94 mEq/L — ABNORMAL LOW (ref 96–112)
Glucose, Bld: 114 mg/dL — ABNORMAL HIGH (ref 70–99)
Sodium: 135 mEq/L (ref 135–145)

## 2011-12-27 LAB — CBC
Hemoglobin: 11.6 g/dL — ABNORMAL LOW (ref 12.0–15.0)
Platelets: 257 10*3/uL (ref 150–400)
RBC: 4.07 MIL/uL (ref 3.87–5.11)
WBC: 7 10*3/uL (ref 4.0–10.5)

## 2011-12-27 LAB — SURGICAL PCR SCREEN: MRSA, PCR: NEGATIVE

## 2011-12-27 LAB — GLUCOSE, CAPILLARY
Glucose-Capillary: 98 mg/dL (ref 70–99)
Glucose-Capillary: 107 mg/dL — ABNORMAL HIGH (ref 70–99)

## 2011-12-27 SURGERY — BIOPSY TEMPORAL ARTERY
Anesthesia: Monitor Anesthesia Care | Site: Head | Laterality: Left | Wound class: Clean

## 2011-12-27 MED ORDER — MIDAZOLAM HCL 5 MG/5ML IJ SOLN
INTRAMUSCULAR | Status: DC | PRN
  Administered 2011-12-27 (×2): 1 mg via INTRAVENOUS

## 2011-12-27 MED ORDER — MUPIROCIN 2 % EX OINT
TOPICAL_OINTMENT | CUTANEOUS | Status: AC
  Administered 2011-12-27: 1 via NASAL
  Filled 2011-12-27: qty 22

## 2011-12-27 MED ORDER — HYDROMORPHONE HCL PF 1 MG/ML IJ SOLN
0.2500 mg | INTRAMUSCULAR | Status: DC | PRN
  Administered 2011-12-27: 0.25 mg via INTRAVENOUS

## 2011-12-27 MED ORDER — CEFAZOLIN SODIUM 1-5 GM-% IV SOLN
INTRAVENOUS | Status: AC
  Filled 2011-12-27: qty 100

## 2011-12-27 MED ORDER — LIDOCAINE-EPINEPHRINE (PF) 1 %-1:200000 IJ SOLN
INTRAMUSCULAR | Status: DC | PRN
  Administered 2011-12-27: 2 mL

## 2011-12-27 MED ORDER — 0.9 % SODIUM CHLORIDE (POUR BTL) OPTIME
TOPICAL | Status: DC | PRN
  Administered 2011-12-27: 1000 mL

## 2011-12-27 MED ORDER — FENTANYL CITRATE 0.05 MG/ML IJ SOLN
INTRAMUSCULAR | Status: DC | PRN
  Administered 2011-12-27 (×2): 25 ug via INTRAVENOUS

## 2011-12-27 MED ORDER — OXYCODONE HCL 5 MG PO TABS: 5.0000 mg | ORAL_TABLET | Freq: Four times a day (QID) | ORAL | Status: AC | PRN

## 2011-12-27 MED ORDER — VANCOMYCIN HCL IN DEXTROSE 1-5 GM/200ML-% IV SOLN
INTRAVENOUS | Status: AC
  Filled 2011-12-27: qty 200

## 2011-12-27 MED ORDER — PROPOFOL 10 MG/ML IV EMUL
INTRAVENOUS | Status: DC | PRN
  Administered 2011-12-27: 25 ug/kg/min via INTRAVENOUS

## 2011-12-27 MED ORDER — SODIUM CHLORIDE 0.9 % IV SOLN: INTRAVENOUS | Status: DC

## 2011-12-27 MED ORDER — LACTATED RINGERS IV SOLN
INTRAVENOUS | Status: DC | PRN
  Administered 2011-12-27 (×2): via INTRAVENOUS

## 2011-12-27 MED ORDER — CEFAZOLIN SODIUM 1-5 GM-% IV SOLN
INTRAVENOUS | Status: DC | PRN
  Administered 2011-12-27: 2 g via INTRAVENOUS

## 2011-12-27 SURGICAL SUPPLY — 41 items
CANISTER SUCTION 2500CC (MISCELLANEOUS) ×2 IMPLANT
CLOTH BEACON ORANGE TIMEOUT ST (SAFETY) ×2 IMPLANT
CONT SPEC 4OZ CLIKSEAL STRL BL (MISCELLANEOUS) ×2 IMPLANT
COTTONBALL LRG STERILE PKG (GAUZE/BANDAGES/DRESSINGS) ×2 IMPLANT
COVER SURGICAL LIGHT HANDLE (MISCELLANEOUS) ×4 IMPLANT
DECANTER SPIKE VIAL GLASS SM (MISCELLANEOUS) ×2 IMPLANT
DERMABOND ADVANCED (GAUZE/BANDAGES/DRESSINGS) ×1
DERMABOND ADVANCED .7 DNX12 (GAUZE/BANDAGES/DRESSINGS) ×1 IMPLANT
DRAPE LAPAROTOMY T 102X78X121 (DRAPES) ×2 IMPLANT
DRAPE ORTHO SPLIT 77X108 STRL (DRAPES) ×1
DRAPE SURG ORHT 6 SPLT 77X108 (DRAPES) ×1 IMPLANT
ELECT REM PT RETURN 9FT ADLT (ELECTROSURGICAL) ×2
ELECTRODE REM PT RTRN 9FT ADLT (ELECTROSURGICAL) ×1 IMPLANT
GAUZE SPONGE 2X2 8PLY STRL LF (GAUZE/BANDAGES/DRESSINGS) IMPLANT
GAUZE SPONGE 4X4 16PLY XRAY LF (GAUZE/BANDAGES/DRESSINGS) ×2 IMPLANT
GLOVE BIO SURGEON STRL SZ7.5 (GLOVE) IMPLANT
GLOVE BIOGEL PI IND STRL 7.5 (GLOVE) ×1 IMPLANT
GLOVE BIOGEL PI INDICATOR 7.5 (GLOVE) ×1
GLOVE SURG SS PI 7.5 STRL IVOR (GLOVE) ×6 IMPLANT
GOWN PREVENTION PLUS XLARGE (GOWN DISPOSABLE) ×4 IMPLANT
GOWN STRL NON-REIN LRG LVL3 (GOWN DISPOSABLE) IMPLANT
KIT BASIN OR (CUSTOM PROCEDURE TRAY) ×2 IMPLANT
KIT ROOM TURNOVER OR (KITS) ×2 IMPLANT
LOOP VESSEL MINI RED (MISCELLANEOUS) ×2 IMPLANT
NEEDLE HYPO 25GX1X1/2 BEV (NEEDLE) ×2 IMPLANT
NS IRRIG 1000ML POUR BTL (IV SOLUTION) ×2 IMPLANT
PACK GENERAL/GYN (CUSTOM PROCEDURE TRAY) ×2 IMPLANT
PAD ARMBOARD 7.5X6 YLW CONV (MISCELLANEOUS) ×4 IMPLANT
SPONGE GAUZE 2X2 STER 10/PKG (GAUZE/BANDAGES/DRESSINGS)
SUCTION FRAZIER TIP 10 FR DISP (SUCTIONS) IMPLANT
SUT PROLENE 6 0 CC (SUTURE) ×2 IMPLANT
SUT SILK 3 0 (SUTURE) ×1
SUT SILK 3-0 18XBRD TIE 12 (SUTURE) ×1 IMPLANT
SUT VIC AB 3-0 SH 27 (SUTURE) ×1
SUT VIC AB 3-0 SH 27X BRD (SUTURE) ×1 IMPLANT
SUT VIC AB 4-0 P-3 18X BRD (SUTURE) ×1 IMPLANT
SUT VIC AB 4-0 P3 18 (SUTURE) ×1
SYR CONTROL 10ML LL (SYRINGE) ×2 IMPLANT
TOWEL OR 17X24 6PK STRL BLUE (TOWEL DISPOSABLE) ×2 IMPLANT
TOWEL OR 17X26 10 PK STRL BLUE (TOWEL DISPOSABLE) ×2 IMPLANT
WATER STERILE IRR 1000ML POUR (IV SOLUTION) ×2 IMPLANT

## 2011-12-27 NOTE — Anesthesia Preprocedure Evaluation (Addendum)
Anesthesia Evaluation  Patient identified by MRN, date of birth, ID band Patient awake    Reviewed: Allergy & Precautions, H&P , NPO status , Patient's Chart, lab work & pertinent test results  Airway Mallampati: II TM Distance: >3 FB Neck ROM: Full    Dental  (+) Teeth Intact and Dental Advisory Given,    Pulmonary shortness of breath and with exertion, Continuous Positive Airway Pressure Ventilation ,  breath sounds clear to auscultation  Pulmonary exam normal       Cardiovascular hypertension, Pt. on medications + CAD Rhythm:Regular Rate:Normal  2 vessel CAD, Nitro PRN, EF 60 % 2010   Neuro/Psych  Headaches, TIAnegative psych ROS   GI/Hepatic Neg liver ROS, GERD-  Medicated and Controlled,  Endo/Other  Diabetes mellitus-, Well Controlled, Type 2, Oral Hypoglycemic Agents  Renal/GU negative Renal ROS     Musculoskeletal negative musculoskeletal ROS (+)   Abdominal Normal abdominal exam  (+)   Peds  Hematology negative hematology ROS (+)   Anesthesia Other Findings   Reproductive/Obstetrics                         Anesthesia Physical Anesthesia Plan  ASA: III  Anesthesia Plan: MAC   Post-op Pain Management:    Induction: Intravenous  Airway Management Planned: Simple Face Mask  Additional Equipment:   Intra-op Plan:   Post-operative Plan:   Informed Consent: I have reviewed the patients History and Physical, chart, labs and discussed the procedure including the risks, benefits and alternatives for the proposed anesthesia with the patient or authorized representative who has indicated his/her understanding and acceptance.   Dental advisory given  Plan Discussed with: Anesthesiologist and Surgeon  Anesthesia Plan Comments:        Anesthesia Quick Evaluation

## 2011-12-27 NOTE — Op Note (Signed)
Procedure: Left temporal artery biopsy  Preoperative diagnosis: Headaches  Postoperative diagnosis: Same  Anesthesia: Local with IV sedation  Specimens: Left temporal artery  Operative details: After obtaining informed consent, the patient was taken to the operating room. The patient was placed in supine position on the operating room table. Next Doppler was used to map out the course of the left superficial temporal artery. This area was prepped and draped in usual sterile fashion. Local anesthesia was infiltrated over this area. Skin incision was made over the area of the left superficial temporal artery. The temporal artery was dissected free circumferentially. This was ligated proximally and distally with 3-0 silk ties. The intervening segment of approximately 3 cm was excised completely and sent to pathology as a specimen. Hemostasis was obtained. The subcutaneous tissues reapproximated using a running 3-0 Vicryl suture. The skin was closed with 4 0 Vicryl subcuticular stitch. Dermabond was applied the skin incision. The patient tolerated procedure well and there were no complications. Instrument sponge and needle counts were correct at the end of the case. The patient was taken to the recovery room in stable condition.  Grete Bosko, MD [facility]  Office: [phone] Pager: [phone]  

## 2011-12-27 NOTE — H&P (View-Only) (Signed)
[facility] [place] HISTORY AND PHYSICAL   History of Present Illness:  Patient is a 66 y.o. year old female who presents for evaluation of syncope, dizziness and left-sided headaches.  The patient has had 3 syncopal episodes since February. She has been seen by neurology and a temporal artery biopsy was recommended. 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 She has a history of an elevated sedimentation rate and has been placed on steroids. She has had previous evaluation with a head CT which showed no evidence of intracranial bleed or stroke. She apparently had a "normal EKG". The patient is known to me for prior evaluation for peripheral arterial disease. Her claudication symptoms are stable. Other medical problems include hyperlipidemia, arthritis, sleep apnea, hypertension, diabetes, coronary artery disease. These are all currently stable.  Past Medical History  Diagnosis Date  . Atypical face pain   . Hypertension   . CAD (coronary artery disease)     Catheterization 2006, mild two-vessel nonobstructive disease  . GERD (gastroesophageal reflux disease)   . Headache   . Hyperlipidemia   . Arthritis   . Sleep apnea     CPAP compliance ???  . Diabetes mellitus   . Shortness of breath   . PAD (peripheral artery disease)     Dr. Darrick Penna,  Bilateral superficial femoral artery occlusions with one vessel runoff via the peroneal artery  . Ejection fraction     EF 60%, echo, October, 2010,  . Urinary incontinence   . Overweight   . Mitral regurgitation     Mild, echo, October, 2010  . Fluid overload   . Foot pain, right     November, 2012  . Stroke     Past Surgical History  Procedure Date  . Cesarean section   . Abdominal hysterectomy   . Eye surgery 12/11/10  . Vesicovaginal fistula closure w/ tah      Social History History  Substance Use Topics  . Smoking status: Former Smoker    Types: Cigarettes    Quit date: 11/13/2010  . Smokeless  tobacco: Not on file  . Alcohol Use: Yes     Social Drinker only/////////STOP DRINKING IN 2001    Family History Family History  Problem Relation Age of Onset  . Diabetes Mother   . Hyperlipidemia Mother   . Hypertension Mother   . Heart disease Father   . Diabetes Sister   . Hypertension Sister   . Hyperlipidemia Sister     Allergies  Allergies  Allergen Reactions  . Amoxicillin      Current Outpatient Prescriptions  Medication Sig Dispense Refill  . acetaminophen-codeine (TYLENOL #3) 300-30 MG per tablet Take by mouth every 6 (six) hours as needed.      Marland Kitchen aspirin 325 MG tablet Take 325 mg by mouth daily.        . citalopram (CELEXA) 20 MG tablet Take 20 mg by mouth daily.        . colchicine 0.6 MG tablet Take two pills once, then take another an hour later.  Can repeat for future gout attacks  30 tablet  0  . furosemide (LASIX) 40 MG tablet Take 1 tablet (40 mg total) by mouth 2 (two) times daily.  60 tablet  11  . gabapentin (NEURONTIN) 300 MG capsule Take 300 mg by mouth daily.      . indomethacin (INDOCIN) 50 MG capsule Take 1 capsule (50 mg total) by mouth 3 (three) times daily with meals.  40 capsule  0  . METFORMIN HCL PO Take 1,000 mg by mouth 2 (two) times daily with a meal.       . Metoprolol Tartrate (LOPRESSOR PO) Take 1,000 mg by mouth 2 (two) times daily.       . nitroGLYCERIN (NITROSTAT) 0.4 MG SL tablet Place 1 tablet (0.4 mg total) under the tongue every 5 (five) minutes as needed.  25 tablet  6  . Olmesartan-Amlodipine-HCTZ (TRIBENZOR) 40-5-25 MG TABS Take by mouth daily.      Marland Kitchen POTASSIUM CHLORIDE CR PO Take 20 mEq by mouth daily.        . traMADol (ULTRAM) 50 MG tablet Take 50 mg by mouth as needed. 1-2 tablets      . triamcinolone (KENALOG) 0.1 % cream Apply 1 application topically 2 (two) times daily.        Marland Kitchen zolpidem (AMBIEN) 10 MG tablet       . diazepam (VALIUM) 5 MG tablet Take 5 mg by mouth every 6 (six) hours as needed.        Marland Kitchen LISINOPRIL PO  Take 40 mg by mouth daily.       Marland Kitchen SIMVASTATIN PO Take 40 mg by mouth. 1/2 tablet daily        ROS:   General:  No weight loss, Fever, chills  HEENT: + recent headaches, no nasal bleeding, no visual changes, no sore throat  Neurologic: + dizziness,+  blackouts, seizures. No recent symptoms of stroke or mini- stroke. No recent episodes of slurred speech, or temporary blindness.  Cardiac: No recent episodes of chest pain/pressure, no shortness of breath at rest.  + shortness of breath with exertion.  Denies history of atrial fibrillation or irregular heartbeat  Vascular: No history of rest pain in feet.  + history of claudication.  No history of non-healing ulcer, No history of DVT   Physical Examination  Filed Vitals:   12/23/11 0922  BP: 118/59  Pulse: 64  Temp: 98.2 F (36.8 C)  TempSrc: Oral  Height: 5' 2.5" (1.588 m)  Weight: 201 lb (91.173 kg)  SpO2: 99%    Body mass index is 36.18 kg/(m^2).  General:  Alert and oriented, no acute distress HEENT: Normal Neck: No bruit or JVD Pulmonary: Clear to auscultation bilaterally Cardiac: Regular Rate and Rhythm without murmur Skin: No rash Extremity Pulses:  2+ radial, brachial, femoral Musculoskeletal: No deformity or edema  Neurologic: Upper and lower extremity motor 5/5 and symmetric  DATA: Patient carotid duplex exam today which I reviewed and interpreted. This showed less than 40% carotid stenosis bilaterally.   ASSESSMENT:   Possible temporal arteritis   PLAN:  Left temporal artery biopsy on Monday, June 3.. Risks benefits possible complications and procedure details were explained the patient today. She agrees to proceed.  Fabienne Bruns, MD [facility] [place] Office: [phone] Pager: [phone]

## 2011-12-27 NOTE — Interval H&P Note (Signed)
History and Physical Interval Note:  12/27/2011 7:38 AM  [name]  has presented today for surgery, with the diagnosis of Temporal Arteritis  The various methods of treatment have been discussed with the patient and family. After consideration of risks, benefits and other options for treatment, the patient has consented to  Procedure(s) (LRB): BIOPSY TEMPORAL ARTERY (Left) as a surgical intervention .  The patients' history has been reviewed, patient examined, no change in status, stable for surgery.  I have reviewed the patients' chart and labs.  Questions were answered to the patient's satisfaction.     [name] E

## 2011-12-27 NOTE — Transfer of Care (Signed)
Immediate Anesthesia Transfer of Care Note  Patient: [name]  Procedure(s) Performed: Procedure(s) (LRB): BIOPSY TEMPORAL ARTERY (Left)  Patient Location: PACU  Anesthesia Type: MAC  Level of Consciousness: awake, alert  and oriented  Airway & Oxygen Therapy: Patient Spontanous Breathing  Post-op Assessment: Report given to PACU RN and Post -op Vital signs reviewed and stable  Post vital signs: Reviewed and stable  Complications: No apparent anesthesia complications

## 2011-12-27 NOTE — Anesthesia Postprocedure Evaluation (Signed)
  Anesthesia Post-op Note  Patient: [name]  Procedure(s) Performed: Procedure(s) (LRB): BIOPSY TEMPORAL ARTERY (Left)  Patient Location: PACU  Anesthesia Type: MAC  Level of Consciousness: awake  Airway and Oxygen Therapy: Patient Spontanous Breathing  Post-op Pain: mild  Post-op Assessment: Post-op Vital signs reviewed  Post-op Vital Signs: Reviewed  Complications: No apparent anesthesia complications

## 2011-12-27 NOTE — Preoperative (Signed)
Beta Blockers   Reason not to administer Beta Blockers:Not Applicable 

## 2011-12-27 NOTE — Anesthesia Procedure Notes (Signed)
Procedure Name: MAC Date/Time: 12/27/2011 7:45 AM Performed by: Margaree Mackintosh Pre-anesthesia Checklist: Patient identified, Emergency Drugs available, Suction available, Patient being monitored and Timeout performed Patient Re-evaluated:Patient Re-evaluated prior to inductionOxygen Delivery Method: Simple face mask Placement Confirmation: positive ETCO2 Dental Injury: Teeth and Oropharynx as per pre-operative assessment

## 2011-12-27 NOTE — Progress Notes (Signed)
Spoke w/ Tiffany in Vascular Lab.  She stated to not do clip/prep in short stay-that they would do in or.//L. Raheim Beutler,RN

## 2011-12-28 ENCOUNTER — Encounter (HOSPITAL_COMMUNITY): Payer: Self-pay | Admitting: Vascular Surgery

## 2011-12-31 NOTE — Procedures (Unsigned)
CAROTID DUPLEX EXAM  INDICATION:  Giant cell arteritis/syncope.  HISTORY: Diabetes:  Yes. Cardiac:  CAD. Hypertension:  Yes. Smoking:  Previous. Previous Surgery:  No. CV History:  Complaint of syncope. Amaurosis [place] No, Paresthesias No, Hemiparesis No                                      RIGHT             LEFT Brachial systolic pressure:         124               122 Brachial Doppler waveforms:         Normal            Normal Vertebral direction of flow:        Antegrade         Antegrade DUPLEX VELOCITIES (cm/sec) CCA peak systolic                   65                80 ECA peak systolic                   97                67 ICA peak systolic                   39                44 ICA end diastolic                   15                11 PLAQUE MORPHOLOGY:                  Heterogeneous     Heterogeneous PLAQUE AMOUNT:                      Mild              Mild PLAQUE LOCATION:                    ICA/ECA           ICA/ECA  IMPRESSION:  Doppler velocities suggest 1-39% stenoses of the bilateral internal carotid arteries.  ___________________________________________ Janetta Hora Fields, MD  CH/MEDQ  D:  12/27/2011  T:  12/27/2011  Job:  [id number]

## 2012-01-20 ENCOUNTER — Other Ambulatory Visit: Payer: Self-pay | Admitting: Family Medicine

## 2012-01-20 DIAGNOSIS — M25531 Pain in right wrist: Secondary | ICD-10-CM

## 2012-01-25 ENCOUNTER — Ambulatory Visit
Admission: RE | Admit: 2012-01-25 | Discharge: 2012-01-25 | Disposition: A | Discharge status: Home / Self Care | Payer: Medicare Other | Source: Ambulatory Visit | Attending: Family Medicine | Admitting: Family Medicine

## 2012-01-25 DIAGNOSIS — M25531 Pain in right wrist: Secondary | ICD-10-CM

## 2012-04-23 ENCOUNTER — Ambulatory Visit: Payer: Medicare Other

## 2012-04-23 ENCOUNTER — Ambulatory Visit (INDEPENDENT_AMBULATORY_CARE_PROVIDER_SITE_OTHER): Payer: Medicare Other | Admitting: Family Medicine

## 2012-04-23 VITALS — BP 130/77 | HR 67 | Temp 98.5°F | Resp 16 | Ht 62.5 in | Wt 193.0 lb

## 2012-04-23 DIAGNOSIS — IMO0001 Reserved for inherently not codable concepts without codable children: Secondary | ICD-10-CM

## 2012-04-23 DIAGNOSIS — IMO0002 Reserved for concepts with insufficient information to code with codable children: Secondary | ICD-10-CM

## 2012-04-23 MED ORDER — NAPROXEN 500 MG PO TABS: 500.0000 mg | ORAL_TABLET | Freq: Two times a day (BID) | ORAL | Status: DC

## 2012-04-23 MED ORDER — CYCLOBENZAPRINE HCL 5 MG PO TABS: 5.0000 mg | ORAL_TABLET | Freq: Three times a day (TID) | ORAL | Status: DC | PRN

## 2012-04-23 NOTE — Progress Notes (Signed)
[address]   [place], [place]  [address]   [phone]  Subjective:    Patient ID: [name], female    DOB: [birth year], 66 y.o.   MRN: [id number]  HPIThis 66 y.o. female presents for evaluation of hip pain R.  Works for [employer] and went on a case; client had 16 steps patient had to walk up and down.  Onset two days into job.  Continues to worsen.  Starts R lower back and into R thigh. With prolonged ambulation, pain worsens.  Some pain with sitting.  Stops at lateral R thigh.  Applying heat.  No n/t/w.  Normal bowel/bladder function.  No saddle paresthesias. No medications other than Tylenol with codeine#3 which she uses for bad headaches; just got two days ago.  Took Tylenol #3 last night without improvement.  Limping due to pain in leg.  History of DDD lumbar; no back specialist.  PCP:  [facility] in [place].  Review of Systems  Constitutional: Negative for fever, chills, diaphoresis and fatigue.  Genitourinary: Negative for dysuria, hematuria and flank pain.  Musculoskeletal: Positive for myalgias, back pain, arthralgias and gait problem. Negative for joint swelling.  Neurological: Positive for headaches. Negative for weakness and numbness.    Past Medical History  Diagnosis Date  . Atypical face pain   . Hypertension   . CAD (coronary artery disease)     Catheterization 2006, mild two-vessel nonobstructive disease  . GERD (gastroesophageal reflux disease)   . Headache   . Hyperlipidemia   . Arthritis   . Sleep apnea     CPAP compliance ???  . Diabetes mellitus   . Shortness of breath   . PAD (peripheral artery disease)     Dr. Darrick Penna,  Bilateral superficial femoral artery occlusions with one vessel runoff via the peroneal artery  . Ejection fraction     EF 60%, echo, October, 2010,  . Urinary incontinence   . Overweight   . Mitral regurgitation     Mild, echo, October, 2010  . Fluid overload   . Foot pain, right     November, 2012  . Stroke      Past Surgical History  Procedure Date  . Cesarean section   . Abdominal hysterectomy   . Eye surgery 12/11/10  . Vesicovaginal fistula closure w/ tah   . Artery biopsy 12/27/2011    Procedure: BIOPSY TEMPORAL ARTERY;  Surgeon: Sherren Kerns, MD;  Location: [facility] OR;  Service: Vascular;  Laterality: Left;    Prior to Admission medications   Medication Sig Start Date End Date Taking? Authorizing Provider  acetaminophen-codeine (TYLENOL #3) 300-30 MG per tablet Take 1 tablet by mouth every 6 (six) hours as needed. For pain 11/26/11  Yes Historical Provider, MD  aspirin 325 MG tablet Take 325 mg by mouth daily.    Yes Historical Provider, MD  citalopram (CELEXA) 20 MG tablet Take 20 mg by mouth daily.    Yes Historical Provider, MD  colchicine 0.6 MG tablet Take 0.6-1.2 mg by mouth as needed. Take two pills once, then take another an hour later.  Can repeat for future gout attacks 11/16/11 11/15/12 Yes Gwenlyn Found Copland, MD  furosemide (LASIX) 40 MG tablet Take 40 mg by mouth 2 (two) times daily. 06/10/11 06/09/12 Yes Luis Abed, MD  gabapentin (NEURONTIN) 300 MG capsule Take 300 mg by mouth daily. 10/19/11  Yes Historical Provider, MD  lisinopril (PRINIVIL,ZESTRIL) 40 MG tablet Take 40 mg by mouth daily.  Yes Historical Provider, MD  metFORMIN (GLUCOPHAGE) 1000 MG tablet Take 1,000 mg by mouth 2 (two) times daily with a meal.   Yes Historical Provider, MD  nitroGLYCERIN (NITROSTAT) 0.4 MG SL tablet Place 0.4 mg under the tongue every 5 (five) minutes as needed. For chest pain 06/10/11  Yes Luis Abed, MD  Olmesartan-Amlodipine-HCTZ [facility]) 40-5-25 MG TABS Take 1 tablet by mouth daily.    Yes Historical Provider, MD  potassium chloride SA (K-DUR,KLOR-CON) 20 MEQ tablet Take 20 mEq by mouth daily. 10/19/11  Yes Historical Provider, MD  simvastatin (ZOCOR) 40 MG tablet Take 20 mg by mouth at bedtime.   Yes Historical Provider, MD  traMADol (ULTRAM) 50 MG tablet Take 50-100 mg by mouth  every 8 (eight) hours as needed. For pain 10/26/11  Yes Historical Provider, MD  triamcinolone (KENALOG) 0.1 % cream Apply 1 application topically 2 (two) times daily.    Yes Historical Provider, MD  zolpidem (AMBIEN) 10 MG tablet Take 10 mg by mouth at bedtime as needed. For sleep 04/15/11  Yes Historical Provider, MD    Allergies  Allergen Reactions  . Amoxicillin     Yeast infection  . Latex Itching    History   Social History  . Marital Status: Widowed    Spouse Name: N/A    Number of Children: N/A  . Years of Education: N/A   Occupational History  . Not on file.   Social History Main Topics  . Smoking status: Former Smoker    Types: Cigarettes    Quit date: 11/13/2010  . Smokeless tobacco: Not on file  . Alcohol Use: Yes     Social Drinker only/////////STOP DRINKING IN 2001  . Drug Use: No  . Sexually Active: Not on file   Other Topics Concern  . Not on file   Social History Narrative  . No narrative on file    Family History  Problem Relation Age of Onset  . Diabetes Mother   . Hyperlipidemia Mother   . Hypertension Mother   . Heart disease Father   . Diabetes Sister   . Hypertension Sister   . Hyperlipidemia Sister   . Anesthesia problems Neg Hx        Objective:   Physical Exam  Nursing note and vitals reviewed. Constitutional: She is oriented to person, place, and time. She appears well-developed and well-nourished. No distress.  Musculoskeletal:       Right hip: She exhibits normal range of motion, normal strength, no tenderness, no bony tenderness and no swelling.       Lumbar back: She exhibits decreased range of motion, tenderness, pain and spasm. She exhibits no bony tenderness, no swelling, no edema, no deformity and no laceration.       LUMBAR SPINE:  PAIN WITH EXTENSION OF BACK; LATERAL BENDING; ROTATION SIDE TO SIDE.  STRAIGHT LEG RAISES NEGATIVE; MARCHING INTACT; MOTOR 5/5 X BLE. R HIP:  FULL ROM R HIP; MOTOR 5/5.  Neurological: She is  alert and oriented to person, place, and time. She has normal reflexes. No cranial nerve deficit or sensory deficit. She exhibits normal muscle tone. Coordination normal.  Skin: Skin is warm and dry. No rash noted. She is not diaphoretic.  Psychiatric: She has a normal mood and affect. Her behavior is normal. Judgment and thought content normal.      [facility] reading (PRIMARY) by  Dr. Katrinka Blazing.  Lumbar films:  DDD with spurring L1-L3; no acute process.  R hip:  NAD  Assessment & Plan:   1. Radicular pain of right lower back  DG Hip Complete Right, DG Lumbar Spine Complete     1.  R lower back pain with radiculopathy:  New.  Rx for Naproxen 500mg  bid scheduled for one week then PRN.  Rx for Flexeril 5mg  tid PRN.  Home exercises provided to perform daily.  To limit lifting, rotating, twisting, bending.  If no improvement in two weeks, call for referral.

## 2012-04-23 NOTE — Patient Instructions (Addendum)
1. Radicular pain of right lower back  DG Hip Complete Right, DG Lumbar Spine Complete, naproxen (NAPROSYN) 500 MG tablet, cyclobenzaprine (FLEXERIL) 5 MG tablet    AVOID REPETITIVE BENDING, TWISTING, ROTATING.  AVOID LIFTING MORE THAN 10 POUNDS FOR NEXT TWO WEEKS.   Piriformis Syndrome with Rehab Piriformis syndrome is a condition the affects the nervous system in the area of the hip, and is characterized by pain and possibly a loss of feeling in the backside (posterior) thigh that may extend down the entire length of the leg. The symptoms are caused by an increase in pressure on the sciatic nerve by the piriformis muscle, which is on the back of the hip and is responsible for externally rotating the hip. The sciatic nerve and its branches connect to much of the leg. Normally the sciatic nerve runs between the piriformis muscle and other muscles. However, in certain individuals the nerve runs through the muscle, which causes an increase in pressure on the nerve and results in the symptoms of piriformis syndrome. SYMPTOMS   Pain, tingling, numbness, or burning in the back of the thigh that may also extend down the entire leg.   Occasionally, tenderness in the buttock.   Loss of function of the leg.   Pain that worsens when using the piriformis muscle (running, jumping, or stairs).   Pain that increases with prolonged sitting.   Pain that is lessened by laying flat on the back.  CAUSES   Piriformis syndrome is the result of an increase in pressure placed on the sciatic nerve. Often times piriformis syndrome is an overuse injury.   Stress placed on the nerve from a sudden increase in the intensity, frequency, or duration of training.   Compensation of other extremity injuries.  RISK INCREASES WITH:  Sports that involve the piriformis muscle (running, walking or jumping).   You are born with (congenital) a defect in which the sciatic nerve passes through the muscle.  PREVENTION  Warm  up and stretch properly before activity.   Allow for adequate recovery between workouts.   Maintain physical fitness:   Strength, flexibility, and endurance.   Cardiovascular fitness.  PROGNOSIS  If treated properly, then the symptoms of piriformis syndrome usually resolve in 2 to 6 weeks. RELATED COMPLICATIONS   Persistent and possibly permanent pain and numbness in the lower extremity.   Weakness of the extremity that may progress to disability and inability to compete.  TREATMENT  The most effective treatment for piriformis syndrome is rest from any activities that aggravate the symptoms. Ice and pain medication may help reduce pain and inflammation. The use of strengthening and stretching exercises may help reduce pain with activity. These exercises may be performed at home or with a therapist. A referral to a therapist may be given for further evaluation and treatment, such as ultrasound. Corticosteroid injections may be given to reduce inflammation that is causing pressure to be placed on the sciatic nerve. If non-surgical (conservative) treatment is unsuccessful, then surgery may be recommended.  MEDICATION   If pain medication is necessary, then nonsteroidal anti-inflammatory medications, such as aspirin and ibuprofen, or other minor pain relievers, such as acetaminophen, are often recommended.   Do not take pain medication for 7 days before surgery.   Prescription pain relievers may be given if deemed necessary by your caregiver. Use only as directed and only as much as you need.   Corticosteroid injections may be given by your caregiver. These injections should be reserved for the most  serious cases, because they may only be given a certain number of times.  HEAT AND COLD:   Cold treatment (icing) relieves pain and reduces inflammation. Cold treatment should be applied for 10 to 15 minutes every 2 to 3 hours for inflammation and pain and immediately after any activity that  aggravates your symptoms. Use ice packs or massage the area with a piece of ice (ice massage).   Heat treatment may be used prior to performing the stretching and strengthening activities prescribed by your caregiver, physical therapist, or athletic trainer. Use a heat pack or soak the injury in warm water.  SEEK IMMEDIATE MEDICAL CARE IF:  Treatment seems to offer no benefit, or the condition worsens.   Any medications produce adverse side effects.  EXERCISES RANGE OF MOTION (ROM) AND STRETCHING EXERCISES - Piriformis Syndrome These exercises may help you when beginning to rehabilitate your injury. Your symptoms may resolve with or without further involvement from your physician, physical therapist or athletic trainer. While completing these exercises, remember:   Restoring tissue flexibility helps normal motion to return to the joints. This allows healthier, less painful movement and activity.   An effective stretch should be held for at least 30 seconds.   A stretch should never be painful. You should only feel a gentle lengthening or release in the stretched tissue.  STRETCH - Hip Rotators  Lie on your back on a firm surface. Grasp your right / left knee with your right / left hand and your ankle with your opposite hand.   Keeping your hips and shoulders firmly planted, gently pull your right / left knee and rotate your lower leg toward your opposite shoulder until you feel a stretch in your buttocks.   Hold this stretch for __________ seconds.  Repeat this stretch __________ times. Complete this stretch __________ times per day. STRETCH - Iliotibial Band  On the floor or bed, lie on your side so your right / left leg is on top. Bend your knee and grab your ankle.   Slowly bring your knee back so that your thigh is in line with your trunk. Keep your heel at your buttocks and gently arch your back so your head, shoulders and hips line up.   Slowly lower your leg so that your knee  approaches the floor/bed until you feel a gentle stretch on the outside of your right / left thigh. If you do not feel a stretch and your knee will not fall farther, place the heel of your opposite foot on top of your knee and pull your thigh down farther.   Hold this stretch for __________ seconds.  Repeat __________ times. Complete __________ times per day. STRENGTHENING EXERCISES - Piriformis Syndrome  These are some of the caregiver again or until your symptoms are resolved. Remember:   Strong muscles with good endurance tolerate stress better.   Do the exercises as initially prescribed by your caregiver. Progress slowly with each exercise, gradually increasing the number of repetitions and weight used under their guidance.  STRENGTH - Hip Abductors, Straight Leg Raises Be aware of your form throughout the entire exercise so that you exercise the correct muscles. Sloppy form means that you are not strengthening the correct muscles.  Lie on your side so that your head, shoulders, knee and hip line up. You may bend your lower knee to help maintain your balance. Your right / left leg should be on top.   Roll your hips slightly forward, so that your hips  are stacked directly over each other and your right / left knee is facing forward.   Lift your top leg up 4-6 inches, leading with your heel. Be sure that your foot does not drift forward or that your knee does not roll toward the ceiling.   Hold this position for __________ seconds. You should feel the muscles in your outer hip lifting (you may not notice this until your leg begins to tire).   Slowly lower your leg to the starting position. Allow the muscles to fully relax before beginning the next repetition.  Repeat __________ times. Complete this exercise __________ times per day.  STRENGTH - Hip Abductors, Quadriped  On a firm, lightly padded surface, position yourself on your hands and knees. Your hands should be directly below your  shoulders and your knees should be directly below your hips.   Keeping your right / left knee bent, lift your leg out to the side. Keep your legs level and in line with your shoulders.   Position yourself on your hands and knees.   Hold for __________ seconds.   Keeping your trunk steady and your hips level, slowly lower your leg to the starting position.  Repeat __________ times. Complete this exercise __________ times per day.  STRENGTH - Hip Abductors, Standing  Tie one end of a rubber exercise band/tubing to a secure surface (table, pole) and tie a loop at the other end.   Place the loop around your right / left ankle. Keeping your ankle with the band directly opposite of the secured end, step away until there is tension in the tube/band.   Hold onto a chair as needed for balance.   Keeping your back upright, your shoulders over your hips, and your toes pointing forward, lift your right / left leg out to your side. Be sure to lift your leg with your hip muscles. Do not "throw" your leg or tip your body to lift your leg.   Slowly and with control, return to the starting position.  Repeat exercise __________ times. Complete this exercise __________ times per day.  Document Released: 07/12/2005 Document Revised: 07/01/2011 Document Reviewed: 10/24/2008 [facility] Patient Information 2012 [place], [place].

## 2012-04-24 NOTE — Progress Notes (Signed)
Reviewed and agree.

## 2012-05-25 ENCOUNTER — Ambulatory Visit: Payer: Medicare Other | Admitting: Vascular Surgery

## 2012-06-07 ENCOUNTER — Encounter: Payer: Self-pay | Admitting: Neurosurgery

## 2012-06-08 ENCOUNTER — Ambulatory Visit: Payer: Medicare Other | Admitting: Neurosurgery

## 2012-06-09 ENCOUNTER — Other Ambulatory Visit: Payer: Self-pay | Admitting: *Deleted

## 2012-06-09 DIAGNOSIS — I70219 Atherosclerosis of native arteries of extremities with intermittent claudication, unspecified extremity: Secondary | ICD-10-CM

## 2012-08-02 ENCOUNTER — Encounter: Payer: Self-pay | Admitting: Neurosurgery

## 2012-08-03 ENCOUNTER — Encounter: Payer: Self-pay | Admitting: Cardiology

## 2012-08-03 ENCOUNTER — Ambulatory Visit: Payer: Medicare Other | Admitting: Neurosurgery

## 2012-09-17 ENCOUNTER — Ambulatory Visit (INDEPENDENT_AMBULATORY_CARE_PROVIDER_SITE_OTHER): Payer: Medicare Other | Admitting: Emergency Medicine

## 2012-09-17 ENCOUNTER — Ambulatory Visit: Payer: Medicare Other

## 2012-09-17 VITALS — BP 120/78 | HR 86 | Temp 98.5°F | Resp 16 | Ht 62.0 in | Wt 195.0 lb

## 2012-09-17 DIAGNOSIS — M109 Gout, unspecified: Secondary | ICD-10-CM

## 2012-09-17 MED ORDER — INDOMETHACIN 25 MG PO CAPS: 25.0000 mg | ORAL_CAPSULE | Freq: Three times a day (TID) | ORAL | Status: AC

## 2012-09-17 MED ORDER — COLCHICINE 0.6 MG PO TABS: 0.6000 mg | ORAL_TABLET | ORAL | Status: DC | PRN

## 2012-09-17 NOTE — Progress Notes (Signed)
[facility] [address], [place] [address] [phone]  Date:  09/17/2012   Name:  [name]   DOB:  03/21/1946   MRN:  [id number]  PCP:  Warrick Parisian, MD    Chief Complaint: Hand Pain   History of Present Illness:  [name] is a 67 y.o. very pleasant female patient who presents with the following:  Awoke Sunday with pain in the base of her left thumb.  Is swollen and guards.  No history or injury or overuse.  History of gout.  Not taking medications for it.  She also says her right foot is "trying to flair up too."  She has some pain in great toe but no redness or swelling.  Patient Active Problem List  Diagnosis  . DM  . DYSLIPIDEMIA  . FLUID OVERLOAD  . HYPOKALEMIA  . OVERWEIGHT  . HYPERTENSION  . SHORTNESS OF BREATH  . CAD (coronary artery disease)  . GERD (gastroesophageal reflux disease)  . Sleep apnea  . PAD (peripheral artery disease)  . Ejection fraction  . Mitral regurgitation  . Foot pain, right  . Giant cell arteritis    Past Medical History  Diagnosis Date  . Atypical face pain   . Hypertension   . CAD (coronary artery disease)     Catheterization 2006, mild two-vessel nonobstructive disease  . GERD (gastroesophageal reflux disease)   . Headache   . Hyperlipidemia   . Arthritis   . Sleep apnea     CPAP compliance ???  . Diabetes mellitus   . Shortness of breath   . PAD (peripheral artery disease)     Dr. Darrick Penna,  Bilateral superficial femoral artery occlusions with one vessel runoff via the peroneal artery  . Ejection fraction     EF 60%, echo, October, 2010,  . Urinary incontinence   . Overweight   . Mitral regurgitation     Mild, echo, October, 2010  . Fluid overload   . Foot pain, right     November, 2012  . Stroke     Past Surgical History  Procedure Laterality Date  . Cesarean section    . Abdominal hysterectomy    . Eye surgery  12/11/10  . Vesicovaginal fistula closure w/ tah    .  Artery biopsy  12/27/2011    Procedure: BIOPSY TEMPORAL ARTERY;  Surgeon: Sherren Kerns, MD;  Location: [facility] OR;  Service: Vascular;  Laterality: Left;    History  Substance Use Topics  . Smoking status: Former Smoker    Types: Cigarettes    Quit date: 11/13/2010  . Smokeless tobacco: Not on file  . Alcohol Use: Yes     Comment: Social Drinker only/////////STOP DRINKING IN 2001    Family History  Problem Relation Age of Onset  . Diabetes Mother   . Hyperlipidemia Mother   . Hypertension Mother   . Heart disease Father   . Diabetes Sister   . Hypertension Sister   . Hyperlipidemia Sister   . Anesthesia problems Neg Hx     Allergies  Allergen Reactions  . Amoxicillin     Yeast infection  . Latex Itching    Medication list has been reviewed and updated.  Current Outpatient Prescriptions on File Prior to Visit  Medication Sig Dispense Refill  . aspirin 325 MG tablet Take 325 mg by mouth daily.       . citalopram (CELEXA) 20 MG tablet Take 20 mg by mouth daily.       Marland Kitchen  colchicine 0.6 MG tablet Take 0.6-1.2 mg by mouth as needed. Take two pills once, then take another an hour later.  Can repeat for future gout attacks      . gabapentin (NEURONTIN) 300 MG capsule Take 300 mg by mouth daily.      Marland Kitchen lisinopril (PRINIVIL,ZESTRIL) 40 MG tablet Take 40 mg by mouth daily.      . metFORMIN (GLUCOPHAGE) 1000 MG tablet Take 1,000 mg by mouth 2 (two) times daily with a meal.      . naproxen (NAPROSYN) 500 MG tablet Take 1 tablet (500 mg total) by mouth 2 (two) times daily with a meal.  30 tablet  0  . nitroGLYCERIN (NITROSTAT) 0.4 MG SL tablet Place 0.4 mg under the tongue every 5 (five) minutes as needed. For chest pain      . potassium chloride SA (K-DUR,KLOR-CON) 20 MEQ tablet Take 20 mEq by mouth daily.      . simvastatin (ZOCOR) 40 MG tablet Take 20 mg by mouth at bedtime.      . triamcinolone (KENALOG) 0.1 % cream Apply 1 application topically 2 (two) times daily.       Marland Kitchen  acetaminophen-codeine (TYLENOL #3) 300-30 MG per tablet Take 1 tablet by mouth every 6 (six) hours as needed. For pain      . cyclobenzaprine (FLEXERIL) 5 MG tablet Take 1 tablet (5 mg total) by mouth 3 (three) times daily as needed for muscle spasms.  30 tablet  0  . furosemide (LASIX) 40 MG tablet Take 40 mg by mouth 2 (two) times daily.      . Olmesartan-Amlodipine-HCTZ (TRIBENZOR) 40-5-25 MG TABS Take 1 tablet by mouth daily.       . traMADol (ULTRAM) 50 MG tablet Take 50-100 mg by mouth every 8 (eight) hours as needed. For pain      . zolpidem (AMBIEN) 10 MG tablet Take 10 mg by mouth at bedtime as needed. For sleep       No current facility-administered medications on file prior to visit.    Review of Systems:  As per HPI, otherwise negative.    Physical Examination: Filed Vitals:   09/17/12 1435  BP: 120/78  Pulse: 86  Temp: 98.5 F (36.9 C)  Resp: 16   Filed Vitals:   09/17/12 1435  Height: 5\' 2"  (1.575 m)  Weight: 195 lb (88.451 kg)   Body mass index is 35.66 kg/(m^2). Ideal Body Weight: Weight in (lb) to have BMI = 25: 136.4   GEN: WDWN, NAD, Non-toxic, Alert & Oriented x 3 HEENT: Atraumatic, Normocephalic.  Ears and Nose: No external deformity. EXTR: No clubbing/cyanosis/edema NEURO: Normal gait.  PSYCH: Normally interactive. Conversant. Not depressed or anxious appearing.  Calm demeanor.  Left hand: exquisite tenderness in left thumb MTP joint.  Swollen and erythematous  Assessment and Plan: Gout exacerbation  Carmelina Dane, MD  [facility] reading (PRIMARY) by  Dr. Dareen Piano.  normal.

## 2012-09-17 NOTE — Patient Instructions (Addendum)
Gout Gout is an inflammatory condition (arthritis) caused by a buildup of uric acid crystals in the joints. Uric acid is a chemical that is normally present in the blood. Under some circumstances, uric acid can form into crystals in your joints. This causes joint redness, soreness, and swelling (inflammation). Repeat attacks are common. Over time, uric acid crystals can form into masses (tophi) near a joint, causing disfigurement. Gout is treatable and often preventable. CAUSES  The disease begins with elevated levels of uric acid in the blood. Uric acid is produced by your body when it breaks down a naturally found substance called purines. This also happens when you eat certain foods such as meats and fish. Causes of an elevated uric acid level include:  Being passed down from parent to child (heredity).  Diseases that cause increased uric acid production (obesity, psoriasis, some cancers).  Excessive alcohol use.  Diet, especially diets rich in meat and seafood.  Medicines, including certain cancer-fighting drugs (chemotherapy), diuretics, and aspirin.  Chronic kidney disease. The kidneys are no longer able to remove uric acid well.  Problems with metabolism. Conditions strongly associated with gout include:  Obesity.  High blood pressure.  High cholesterol.  Diabetes. Not everyone with elevated uric acid levels gets gout. It is not understood why some people get gout and others do not. Surgery, joint injury, and eating too much of certain foods are some of the factors that can lead to gout. SYMPTOMS   An attack of gout comes on quickly. It causes intense pain with redness, swelling, and warmth in a joint.  Fever can occur.  Often, only one joint is involved. Certain joints are more commonly involved:  Base of the big toe.  Knee.  Ankle.  Wrist.  Finger. Without treatment, an attack usually goes away in a few days to weeks. Between attacks, you usually will not have  symptoms, which is different from many other forms of arthritis. DIAGNOSIS  Your caregiver will suspect gout based on your symptoms and exam. Removal of fluid from the joint (arthrocentesis) is done to check for uric acid crystals. Your caregiver will give you a medicine that numbs the area (local anesthetic) and use a needle to remove joint fluid for exam. Gout is confirmed when uric acid crystals are seen in joint fluid, using a special microscope. Sometimes, blood, urine, and X-ray tests are also used. TREATMENT  There are 2 phases to gout treatment: treating the sudden onset (acute) attack and preventing attacks (prophylaxis). Treatment of an Acute Attack  Medicines are used. These include anti-inflammatory medicines or steroid medicines.  An injection of steroid medicine into the affected joint is sometimes necessary.  The painful joint is rested. Movement can worsen the arthritis.  You may use warm or cold treatments on painful joints, depending which works best for you.  Discuss the use of coffee, vitamin C, or cherries with your caregiver. These may be helpful treatment options. Treatment to Prevent Attacks After the acute attack subsides, your caregiver may advise prophylactic medicine. These medicines either help your kidneys eliminate uric acid from your body or decrease your uric acid production. You may need to stay on these medicines for a very long time. The early phase of treatment with prophylactic medicine can be associated with an increase in acute gout attacks. For this reason, during the first few months of treatment, your caregiver may also advise you to take medicines usually used for acute gout treatment. Be sure you understand your caregiver's directions.   You should also discuss dietary treatment with your caregiver. Certain foods such as meats and fish can increase uric acid levels. Other foods such as dairy can decrease levels. Your caregiver can give you a list of foods  to avoid. HOME CARE INSTRUCTIONS   Do not take aspirin to relieve pain. This raises uric acid levels.  Only take over-the-counter or prescription medicines for pain, discomfort, or fever as directed by your caregiver.  Rest the joint as much as possible. When in bed, keep sheets and blankets off painful areas.  Keep the affected joint raised (elevated).  Use crutches if the painful joint is in your leg.  Drink enough water and fluids to keep your urine clear or pale yellow. This helps your body get rid of uric acid. Do not drink alcoholic beverages. They slow the passage of uric acid.  Follow your caregiver's dietary instructions. Pay careful attention to the amount of protein you eat. Your daily diet should emphasize fruits, vegetables, whole grains, and fat-free or low-fat milk products.  Maintain a healthy body weight. SEEK MEDICAL CARE IF:   You have an oral temperature above 102 F (38.9 C).  You develop diarrhea, vomiting, or any side effects from medicines.  You do not feel better in 24 hours, or you are getting worse. SEEK IMMEDIATE MEDICAL CARE IF:   Your joint becomes suddenly more tender and you have:  Chills.  An oral temperature above 102 F (38.9 C), not controlled by medicine. MAKE SURE YOU:   Understand these instructions.  Will watch your condition.  Will get help right away if you are not doing well or get worse. Document Released: 07/09/2000 Document Revised: 10/04/2011 Document Reviewed: 10/20/2009 Exit[employer] Patient Information 2013 [employer].    

## 2013-01-12 ENCOUNTER — Encounter: Payer: Self-pay | Admitting: Vascular Surgery

## 2013-01-17 ENCOUNTER — Ambulatory Visit (INDEPENDENT_AMBULATORY_CARE_PROVIDER_SITE_OTHER): Payer: Medicare Other | Admitting: Cardiology

## 2013-01-17 ENCOUNTER — Encounter: Payer: Self-pay | Admitting: Cardiology

## 2013-01-17 VITALS — BP 140/74 | HR 67 | Ht 62.0 in | Wt 191.4 lb

## 2013-01-17 DIAGNOSIS — I2510 Atherosclerotic heart disease of native coronary artery without angina pectoris: Secondary | ICD-10-CM

## 2013-01-17 DIAGNOSIS — E785 Hyperlipidemia, unspecified: Secondary | ICD-10-CM

## 2013-01-17 MED ORDER — NITROGLYCERIN 0.4 MG SL SUBL: 0.4000 mg | SUBLINGUAL_TABLET | SUBLINGUAL | Status: DC | PRN

## 2013-01-17 NOTE — Progress Notes (Signed)
HPI  Patient is seen to followup coronary disease. I saw her last November, 2012. Historically she has normal left ventricular function. We know that catheterization the past showed mild 2 vessel disease. Recently she's had a great deal of stress with her granddaughter living with her. She needed some nitroglycerin. This seemed to help and she's had no further problems. She is not having any exertional symptoms.  The patient has had some type of problem with infection in both eyes for which she required surgery.  Allergies  Allergen Reactions  . Amoxicillin     Yeast infection  . Latex Itching    Current Outpatient Prescriptions  Medication Sig Dispense Refill  . acetaminophen-codeine (TYLENOL #3) 300-30 MG per tablet Take 1 tablet by mouth every 6 (six) hours as needed. For pain      . aspirin 325 MG tablet Take 325 mg by mouth daily.       Marland Kitchen atorvastatin (LIPITOR) 20 MG tablet Take 20 mg by mouth daily.       . citalopram (CELEXA) 20 MG tablet Take 20 mg by mouth daily.       . colchicine 0.6 MG tablet Take 1-2 tablets (0.6-1.2 mg total) by mouth as needed. Take two pills once, then take another an hour later.  Take one daily until resolved  30 tablet  1  . cyclobenzaprine (FLEXERIL) 5 MG tablet Take 1 tablet (5 mg total) by mouth 3 (three) times daily as needed for muscle spasms.  30 tablet  0  . furosemide (LASIX) 40 MG tablet Take 40 mg by mouth 2 (two) times daily.      Marland Kitchen gabapentin (NEURONTIN) 300 MG capsule Take 300 mg by mouth daily.      . hydrochlorothiazide (HYDRODIURIL) 25 MG tablet Take 25 mg by mouth daily.       . indomethacin (INDOCIN) 50 MG capsule       . lisinopril (PRINIVIL,ZESTRIL) 40 MG tablet Take 40 mg by mouth daily.      . metFORMIN (GLUCOPHAGE) 1000 MG tablet Take 1,000 mg by mouth 2 (two) times daily with a meal.      . metoprolol (LOPRESSOR) 100 MG tablet       . naproxen (NAPROSYN) 500 MG tablet Take 1 tablet (500 mg total) by mouth 2 (two) times daily  with a meal.  30 tablet  0  . nitroGLYCERIN (NITROSTAT) 0.4 MG SL tablet Place 0.4 mg under the tongue every 5 (five) minutes as needed. For chest pain      . Olmesartan-Amlodipine-HCTZ (TRIBENZOR) 40-5-25 MG TABS Take 1 tablet by mouth daily.       . potassium chloride SA (K-DUR,KLOR-CON) 20 MEQ tablet Take 20 mEq by mouth daily.      . simvastatin (ZOCOR) 40 MG tablet Take 20 mg by mouth at bedtime.      . traMADol (ULTRAM) 50 MG tablet Take 50-100 mg by mouth every 8 (eight) hours as needed. For pain      . traZODone (DESYREL) 100 MG tablet       . triamcinolone (KENALOG) 0.1 % cream Apply 1 application topically 2 (two) times daily.       Marland Kitchen zolpidem (AMBIEN) 10 MG tablet Take 10 mg by mouth at bedtime as needed. For sleep       No current facility-administered medications for this visit.    History   Social History  . Marital Status: Widowed    Spouse Name: N/A  Number of Children: N/A  . Years of Education: N/A   Occupational History  . Not on file.   Social History Main Topics  . Smoking status: Former Smoker    Types: Cigarettes    Quit date: 11/13/2010  . Smokeless tobacco: Not on file  . Alcohol Use: Yes     Comment: Social Drinker only/////////STOP DRINKING IN 2001  . Drug Use: No  . Sexually Active: Not on file   Other Topics Concern  . Not on file   Social History Narrative  . No narrative on file    Family History  Problem Relation Age of Onset  . Diabetes Mother   . Hyperlipidemia Mother   . Hypertension Mother   . Heart disease Father   . Diabetes Sister   . Hypertension Sister   . Hyperlipidemia Sister   . Anesthesia problems Neg Hx     Past Medical History  Diagnosis Date  . Atypical face pain   . Hypertension   . CAD (coronary artery disease)     Catheterization 2006, mild two-vessel nonobstructive disease  . GERD (gastroesophageal reflux disease)   . Headache(784.0)   . Hyperlipidemia   . Arthritis   . Sleep apnea     CPAP  compliance ???  . Diabetes mellitus   . Shortness of breath   . PAD (peripheral artery disease)     Dr. Darrick Penna,  Bilateral superficial femoral artery occlusions with one vessel runoff via the peroneal artery  . Ejection fraction     EF 60%, echo, October, 2010,  . Urinary incontinence   . Overweight(278.02)   . Mitral regurgitation     Mild, echo, October, 2010  . Fluid overload   . Foot pain, right     November, 2012  . Stroke     Past Surgical History  Procedure Laterality Date  . Cesarean section    . Abdominal hysterectomy    . Eye surgery  12/11/10  . Vesicovaginal fistula closure w/ tah    . Artery biopsy  12/27/2011    Procedure: BIOPSY TEMPORAL ARTERY;  Surgeon: Sherren Kerns, MD;  Location: [facility] OR;  Service: Vascular;  Laterality: Left;    Patient Active Problem List   Diagnosis Date Noted  . Giant cell arteritis 12/23/2011  . Foot pain, right   . CAD (coronary artery disease)   . GERD (gastroesophageal reflux disease)   . Sleep apnea   . PAD (peripheral artery disease)   . Ejection fraction   . Mitral regurgitation   . FLUID OVERLOAD 06/04/2009  . HYPOKALEMIA 06/04/2009  . SHORTNESS OF BREATH 05/07/2009  . DM 05/06/2009  . DYSLIPIDEMIA 05/06/2009  . OVERWEIGHT 05/06/2009  . HYPERTENSION 05/06/2009    ROS   patient denies fever, chills, headache, sweats, rash, change in vision, change in hearing, cough, nausea vomiting, urinary symptoms. All other systems are reviewed and are negative.  PHYSICAL EXAM  patient is overweight. She stable. She is oriented to person time and place. Affect is normal. There is no jugulovenous distention. Lungs are clear. Respiratory effort is nonlabored. Cardiac exam reveals S1 and S2. There no clicks or significant murmurs. The abdomen is soft. Is no peripheral edema.  Filed Vitals:   01/17/13 1618  BP: 140/74  Pulse: 67  Height: 5\' 2"  (1.575 m)  Weight: 191 lb 6.4 oz (86.818 kg)  SpO2: 98%   EKG is done today and  reviewed by me. There is normal sinus rhythm. There are no diagnostic  changes.  ASSESSMENT & PLAN

## 2013-01-17 NOTE — Assessment & Plan Note (Signed)
The patient had mild coronary disease in the past. She had some recent chest discomfort that was very limited and related completely to very stressful situation. I feel that further workup is not necessary at this time. We will be sure that she has nitroglycerin available. She will be in touch if she has any increasing symptoms.

## 2013-01-17 NOTE — Patient Instructions (Addendum)
Your physician recommends that you continue on your current medications as directed. Please refer to the Current Medication list given to you today.  Your physician wants you to follow-up in: 6 months. You will receive a reminder letter in the mail two months in advance. If you don't receive a letter, please call our office to schedule the follow-up appointment.  

## 2013-01-17 NOTE — Assessment & Plan Note (Signed)
Her lipids are being treated. No change in therapy. 

## 2013-02-10 ENCOUNTER — Ambulatory Visit (INDEPENDENT_AMBULATORY_CARE_PROVIDER_SITE_OTHER): Payer: Medicare Other | Admitting: Family Medicine

## 2013-02-10 VITALS — BP 140/70 | HR 58 | Temp 98.1°F | Resp 16 | Ht 62.5 in | Wt 188.0 lb

## 2013-02-10 DIAGNOSIS — M25579 Pain in unspecified ankle and joints of unspecified foot: Secondary | ICD-10-CM

## 2013-02-10 DIAGNOSIS — M109 Gout, unspecified: Secondary | ICD-10-CM

## 2013-02-10 MED ORDER — COLCHICINE 0.6 MG PO TABS: 0.6000 mg | ORAL_TABLET | ORAL | Status: DC | PRN

## 2013-02-10 MED ORDER — INDOMETHACIN 25 MG PO CAPS: 25.0000 mg | ORAL_CAPSULE | Freq: Two times a day (BID) | ORAL | Status: DC

## 2013-02-10 NOTE — Progress Notes (Signed)
[facility]:  Office Visit  Chief Complaint:  Chief Complaint  Patient presents with  . Gout    HPI: [name] is a 67 y.o. female who complains of  Bilateral gouty attack x 4  days, she ran out of her gout medicines. Woke her up last night. Sharp pain and burns, Underneath her big toe and it turns red. 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 She has DM with some neuropathy but it is not like this. She is on HCTZ for HTN  Past Medical History  Diagnosis Date  . Atypical face pain   . Hypertension   . CAD (coronary artery disease)     Catheterization 2006, mild two-vessel nonobstructive disease  . GERD (gastroesophageal reflux disease)   . Headache(784.0)   . Hyperlipidemia   . Arthritis   . Sleep apnea     CPAP compliance ???  . Diabetes mellitus   . Shortness of breath   . PAD (peripheral artery disease)     Dr. Darrick Penna,  Bilateral superficial femoral artery occlusions with one vessel runoff via the peroneal artery  . Ejection fraction     EF 60%, echo, October, 2010,  . Urinary incontinence   . Overweight(278.02)   . Mitral regurgitation     Mild, echo, October, 2010  . Fluid overload   . Foot pain, right     November, 2012  . Stroke    Past Surgical History  Procedure Laterality Date  . Cesarean section    . Abdominal hysterectomy    . Eye surgery  12/11/10  . Vesicovaginal fistula closure w/ tah    . Artery biopsy  12/27/2011    Procedure: BIOPSY TEMPORAL ARTERY;  Surgeon: Sherren Kerns, MD;  Location: [facility] OR;  Service: Vascular;  Laterality: Left;   History   Social History  . Marital Status: Widowed    Spouse Name: N/A    Number of Children: N/A  . Years of Education: N/A   Social History Main Topics  . Smoking status: Former Smoker    Types: Cigarettes    Quit date: 11/13/2010  . Smokeless tobacco: None  . Alcohol Use: Yes     Comment: Social  Drinker only/////////STOP DRINKING IN 2001  . Drug Use: No  . Sexually Active: None   Other Topics Concern  . None   Social History Narrative  . None   Family History  Problem Relation Age of Onset  . Diabetes Mother   . Hyperlipidemia Mother   . Hypertension Mother   . Heart disease Father   . Diabetes Sister   . Hypertension Sister   . Hyperlipidemia Sister   . Anesthesia problems Neg Hx    Allergies  Allergen Reactions  . Amoxicillin     Yeast infection  . Latex Itching   Prior to Admission medications   Medication Sig Start Date End Date Taking? Authorizing Provider  aspirin 325 MG tablet Take 325 mg by mouth daily.    Yes Historical Provider, MD  atorvastatin (LIPITOR) 20 MG tablet Take 20 mg by mouth daily.  01/09/13  Yes Historical Provider, MD  citalopram (CELEXA) 20 MG tablet Take 20 mg by mouth daily.    Yes Historical Provider, MD  colchicine 0.6 MG tablet Take 1-2 tablets (0.6-1.2 mg total) by mouth as needed. Take two pills once, then take another an  hour later.  Take one daily until resolved 09/17/12 09/17/13 Yes Phillips Odor, MD  gabapentin (NEURONTIN) 300 MG capsule Take 300 mg by mouth daily. 10/19/11  Yes Historical Provider, MD  hydrochlorothiazide (HYDRODIURIL) 25 MG tablet Take 25 mg by mouth daily.  10/25/12  Yes Historical Provider, MD  lisinopril (PRINIVIL,ZESTRIL) 40 MG tablet Take 40 mg by mouth daily.   Yes Historical Provider, MD  metFORMIN (GLUCOPHAGE) 1000 MG tablet Take 1,000 mg by mouth 2 (two) times daily with a meal.   Yes Historical Provider, MD  nitroGLYCERIN (NITROSTAT) 0.4 MG SL tablet Place 1 tablet (0.4 mg total) under the tongue every 5 (five) minutes as needed. For chest pain 01/17/13  Yes Luis Abed, MD  Olmesartan-Amlodipine-HCTZ [facility]) 40-5-25 MG TABS Take 1 tablet by mouth daily.    Yes Historical Provider, MD  potassium chloride SA (K-DUR,KLOR-CON) 20 MEQ tablet Take 20 mEq by mouth daily. 10/19/11  Yes Historical Provider, MD   triamcinolone (KENALOG) 0.1 % cream Apply 1 application topically 2 (two) times daily.    Yes Historical Provider, MD  acetaminophen-codeine (TYLENOL #3) 300-30 MG per tablet Take 1 tablet by mouth every 6 (six) hours as needed. For pain 11/26/11   Historical Provider, MD  cyclobenzaprine (FLEXERIL) 5 MG tablet Take 1 tablet (5 mg total) by mouth 3 (three) times daily as needed for muscle spasms. 04/23/12   Ethelda Chick, MD  furosemide (LASIX) 40 MG tablet Take 40 mg by mouth 2 (two) times daily. 06/10/11 01/17/13  Luis Abed, MD  indomethacin (INDOCIN) 50 MG capsule  07/10/12   Historical Provider, MD  metoprolol (LOPRESSOR) 100 MG tablet  06/14/12   Historical Provider, MD  naproxen (NAPROSYN) 500 MG tablet Take 1 tablet (500 mg total) by mouth 2 (two) times daily with a meal. 04/23/12   Ethelda Chick, MD  simvastatin (ZOCOR) 40 MG tablet Take 20 mg by mouth at bedtime.    Historical Provider, MD  traMADol (ULTRAM) 50 MG tablet Take 50-100 mg by mouth every 8 (eight) hours as needed. For pain 10/26/11   Historical Provider, MD  traZODone (DESYREL) 100 MG tablet  01/09/13   Historical Provider, MD  zolpidem (AMBIEN) 10 MG tablet Take 10 mg by mouth at bedtime as needed. For sleep 04/15/11   Historical Provider, MD     ROS: The patient denies fevers, chills, night sweats, unintentional weight loss, chest pain, palpitations, wheezing, dyspnea on exertion, nausea, vomiting, abdominal pain, dysuria, hematuria, melena,   All other systems have been reviewed and were otherwise negative with the exception of those mentioned in the HPI and as above.    PHYSICAL EXAM: Filed Vitals:   02/10/13 1610  BP: 140/70  Pulse: 58  Temp: 98.1 F (36.7 C)  Resp: 16   Filed Vitals:   02/10/13 1610  Height: 5' 2.5" (1.588 m)  Weight: 188 lb (85.276 kg)   Body mass index is 33.82 kg/(m^2).  General: Alert, no acute distress HEENT:  Normocephalic, atraumatic, oropharynx patent.  Cardiovascular:  Regular  rate and rhythm, no rubs murmurs or gallops.  No Carotid bruits, radial pulse intact. No pedal edema.  Respiratory: Clear to auscultation bilaterally.  No wheezes, rales, or rhonchi.  No cyanosis, no use of accessory musculature GI: No organomegaly, abdomen is soft and non-tender, positive bowel sounds.  No masses. Skin: No rashes. Neurologic: Facial musculature symmetric. Psychiatric: Patient is appropriate throughout our interaction. Lymphatic: No cervical lymphadenopathy Musculoskeletal: Gait intact. + tender at bil  great toes, warm, swollen Full ROM, 5/5 strength, sensation intact  LABS: Results for orders placed during the hospital encounter of 12/27/11  SURGICAL PCR SCREEN      Result Value Range   MRSA, PCR NEGATIVE  NEGATIVE   Staphylococcus aureus NEGATIVE  NEGATIVE  GLUCOSE, CAPILLARY      Result Value Range   Glucose-Capillary 98  70 - 99 mg/dL  CBC      Result Value Range   WBC 7.0  4.0 - 10.5 K/uL   RBC 4.07  3.87 - 5.11 MIL/uL   Hemoglobin 11.6 (*) 12.0 - 15.0 g/dL   HCT 95.6 (*) 38.7 - 56.4 %   MCV 85.0  78.0 - 100.0 fL   MCH 28.5  26.0 - 34.0 pg   MCHC 33.5  30.0 - 36.0 g/dL   RDW 33.2  95.1 - 88.4 %   Platelets 257  150 - 400 K/uL  BASIC METABOLIC PANEL      Result Value Range   Sodium 135  135 - 145 mEq/L   Potassium 4.2  3.5 - 5.1 mEq/L   Chloride 94 (*) 96 - 112 mEq/L   CO2 29  19 - 32 mEq/L   Glucose, Bld 114 (*) 70 - 99 mg/dL   BUN 32 (*) 6 - 23 mg/dL   Creatinine, Ser 1.66 (*) 0.50 - 1.10 mg/dL   Calcium 9.8  8.4 - 06.3 mg/dL   GFR calc non Af Amer 49 (*) >90 mL/min   GFR calc Af Amer 57 (*) >90 mL/min  GLUCOSE, CAPILLARY      Result Value Range   Glucose-Capillary 107 (*) 70 - 99 mg/dL  GLUCOSE, CAPILLARY      Result Value Range   Glucose-Capillary 94  70 - 99 mg/dL     EKG/XRAY:   Primary read interpreted by Dr. Conley Rolls at [facility].   ASSESSMENT/PLAN: Encounter Diagnoses  Name Primary?  . Gout flare Yes  . Pain in joint, ankle and foot,  unspecified laterality    Will get CMP and uric acid Will rx cochicine and also indomethacine Will check creatinine since she has had abnormal levels before and is on chronic NSAIDs F/u by phone    Koah Chisenhall PHUONG, DO 02/10/2013 5:25 PM

## 2013-02-11 LAB — COMPREHENSIVE METABOLIC PANEL
ALT: 10 U/L (ref 0–35)
CO2: 28 mEq/L (ref 19–32)
Calcium: 9.5 mg/dL (ref 8.4–10.5)
Chloride: 101 mEq/L (ref 96–112)
Creat: 1.33 mg/dL — ABNORMAL HIGH (ref 0.50–1.10)
Glucose, Bld: 91 mg/dL (ref 70–99)
Total Bilirubin: 0.9 mg/dL (ref 0.3–1.2)
Total Protein: 7.8 g/dL (ref 6.0–8.3)

## 2013-02-11 LAB — COMPREHENSIVE METABOLIC PANEL WITH GFR
AST: 12 U/L (ref 0–37)
Albumin: 4.1 g/dL (ref 3.5–5.2)
Alkaline Phosphatase: 110 U/L (ref 39–117)
BUN: 20 mg/dL (ref 6–23)
Potassium: 3.6 meq/L (ref 3.5–5.3)
Sodium: 139 meq/L (ref 135–145)

## 2013-02-11 LAB — URIC ACID: Uric Acid, Serum: 12.8 mg/dL — ABNORMAL HIGH (ref 2.4–7.0)

## 2013-02-27 ENCOUNTER — Ambulatory Visit: Payer: Medicare Other

## 2013-02-27 ENCOUNTER — Ambulatory Visit (INDEPENDENT_AMBULATORY_CARE_PROVIDER_SITE_OTHER): Payer: Medicare Other | Admitting: Internal Medicine

## 2013-02-27 VITALS — BP 140/78 | HR 120 | Temp 97.9°F | Resp 18 | Ht 63.0 in | Wt 183.0 lb

## 2013-02-27 DIAGNOSIS — M545 Low back pain, unspecified: Secondary | ICD-10-CM

## 2013-02-27 DIAGNOSIS — M4716 Other spondylosis with myelopathy, lumbar region: Secondary | ICD-10-CM

## 2013-02-27 DIAGNOSIS — N390 Urinary tract infection, site not specified: Secondary | ICD-10-CM

## 2013-02-27 LAB — POCT URINALYSIS DIPSTICK
Glucose, UA: NEGATIVE
Nitrite, UA: NEGATIVE
Protein, UA: 30
Urobilinogen, UA: 2

## 2013-02-27 LAB — POCT UA - MICROSCOPIC ONLY
Casts, Ur, LPF, POC: NEGATIVE
Crystals, Ur, HPF, POC: NEGATIVE

## 2013-02-27 LAB — POCT SEDIMENTATION RATE: POCT SED RATE: 130 mm/hr — AB (ref 0–22)

## 2013-02-27 MED ORDER — CIPROFLOXACIN HCL 500 MG PO TABS: 500.0000 mg | ORAL_TABLET | Freq: Two times a day (BID) | ORAL | Status: DC

## 2013-02-27 MED ORDER — PHENAZOPYRIDINE HCL 200 MG PO TABS: 200.0000 mg | ORAL_TABLET | Freq: Three times a day (TID) | ORAL | Status: DC | PRN

## 2013-02-27 MED ORDER — CEFTRIAXONE SODIUM 1 G IJ SOLR
1.0000 g | INTRAMUSCULAR | Status: DC
  Administered 2013-02-27: 1 g via INTRAMUSCULAR

## 2013-02-27 MED ORDER — HYDROCODONE-ACETAMINOPHEN 5-325 MG PO TABS: 1.0000 | ORAL_TABLET | Freq: Four times a day (QID) | ORAL | Status: DC | PRN

## 2013-02-27 NOTE — Progress Notes (Signed)
  Subjective:    Patient ID: [name], female    DOB: 1946-04-30, 67 y.o.   MRN: [id number]  HPI    Review of Systems     Objective:   Physical Exam        Assessment & Plan:

## 2013-02-27 NOTE — Patient Instructions (Addendum)
Urinary Tract Infection  Urinary tract infections (UTIs) can develop anywhere along your urinary tract. Your urinary tract is your body's drainage system for removing wastes and extra water. Your urinary tract includes two kidneys, two ureters, a bladder, and a urethra. Your kidneys are a pair of bean-shaped organs. Each kidney is about the size of your fist. They are located below your ribs, one on each side of your spine.  CAUSES  Infections are caused by microbes, which are microscopic organisms, including fungi, viruses, and bacteria. These organisms are so small that they can only be seen through a microscope. Bacteria are the microbes that most commonly cause UTIs.  SYMPTOMS   Symptoms of UTIs may vary by age and gender of the patient and by the location of the infection. Symptoms in young women typically include a frequent and intense urge to urinate and a painful, burning feeling in the bladder or urethra during urination. Older women and men are more likely to be tired, shaky, and weak and have muscle aches and abdominal pain. A fever may mean the infection is in your kidneys. Other symptoms of a kidney infection include pain in your back or sides below the ribs, nausea, and vomiting.  DIAGNOSIS  To diagnose a UTI, your caregiver will ask you about your symptoms. Your caregiver also will ask to provide a urine sample. The urine sample will be tested for bacteria and white blood cells. White blood cells are made by your body to help fight infection.  TREATMENT   Typically, UTIs can be treated with medication. Because most UTIs are caused by a bacterial infection, they usually can be treated with the use of antibiotics. The choice of antibiotic and length of treatment depend on your symptoms and the type of bacteria causing your infection.  HOME CARE INSTRUCTIONS   If you were prescribed antibiotics, take them exactly as your caregiver instructs you. Finish the medication even if you feel better after you  have only taken some of the medication.   Drink enough water and fluids to keep your urine clear or pale yellow.   Avoid caffeine, tea, and carbonated beverages. They tend to irritate your bladder.   Empty your bladder often. Avoid holding urine for long periods of time.   Empty your bladder before and after sexual intercourse.   After a bowel movement, women should cleanse from front to back. Use each tissue only once.  SEEK MEDICAL CARE IF:    You have back pain.   You develop a fever.   Your symptoms do not begin to resolve within 3 days.  SEEK IMMEDIATE MEDICAL CARE IF:    You have severe back pain or lower abdominal pain.   You develop chills.   You have nausea or vomiting.   You have continued burning or discomfort with urination.  MAKE SURE YOU:    Understand these instructions.   Will watch your condition.   Will get help right away if you are not doing well or get worse.  Document Released: 04/21/2005 Document Revised: 01/11/2012 Document Reviewed: 08/20/2011  [employer] Patient Information 2014 [employer].

## 2013-02-27 NOTE — Progress Notes (Signed)
  Subjective:    Patient ID: [name], female    DOB: [birth year], 67 y.o.   MRN: [id number]  HPI Patient c/o lower back pain started on 02/24/13. Patient has not done anything to create pain, unsure as to why she is having pain. Pain is severe and hard to get up, sit down , go to bathroom. Pain is cental lumbar with right radiculopathy. No new weakness, numbness, or incontinence. Spine xrs 2013 showed diffuse spondylosis. No recent trauma, she woke up pain . Gout uric acid 12.5, told her to f/up with her doctor.    Review of Systems Frequency and burning recently    Objective:   Physical Exam  Vitals reviewed. Constitutional: She is oriented to person, place, and time. She appears well-nourished. She appears distressed.  Eyes: EOM are normal.  Neck: Neck supple.  Cardiovascular: Normal rate.   Pulmonary/Chest: Effort normal.  Musculoskeletal: She exhibits tenderness.       Lumbar back: She exhibits decreased range of motion, tenderness, bony tenderness, pain and spasm. She exhibits no swelling, no edema, no deformity and normal pulse.  Neurological: She is alert and oriented to person, place, and time. She has normal strength. No cranial nerve deficit or sensory deficit. She displays a negative Romberg sign. Coordination and gait abnormal.  Reflex Scores:      Patellar reflexes are 2+ on the right side and 2+ on the left side.      Achilles reflexes are 2+ on the right side and 2+ on the left side. Movement worsens pain  Skin: No rash noted.  Psychiatric: Judgment and thought content normal. Her mood appears anxious. She is agitated. Cognition and memory are normal.   [facility] reading (PRIMARY) by  Dr guest sed rate 130, spondylosis diffuse, compression L5  Results for orders placed in visit on 02/27/13  POCT UA - MICROSCOPIC ONLY      Result Value Range   WBC, Ur, HPF, POC tntc     RBC, urine, microscopic tntc     Bacteria, U Microscopic 2+     Mucus, UA trace     Epithelial  cells, urine per micros 0-3     Crystals, Ur, HPF, POC neg     Casts, Ur, LPF, POC neg     Yeast, UA neg    POCT URINALYSIS DIPSTICK      Result Value Range   Color, UA yellow     Clarity, UA clear     Glucose, UA neg     Bilirubin, UA small     Ketones, UA neg     Spec Grav, UA 1.020     Blood, UA trace-intact     pH, UA 5.5     Protein, UA 30     Urobilinogen, UA 2.0     Nitrite, UA neg     Leukocytes, UA large (3+)     Totally appeared musculoskeletal but is probably Pyelonephritis  Sed Rate 130     Assessment & Plan:  Rocephin 1g/Cipro 500mg bid/Pyridium prn RTC 2d see me

## 2013-03-01 LAB — URINE CULTURE: Colony Count: 50000

## 2013-03-18 ENCOUNTER — Encounter (HOSPITAL_COMMUNITY): Payer: Self-pay

## 2013-03-18 ENCOUNTER — Emergency Department (HOSPITAL_COMMUNITY): Payer: Medicare Other

## 2013-03-18 ENCOUNTER — Inpatient Hospital Stay (HOSPITAL_COMMUNITY)
Admission: EM | Admit: 2013-03-18 | Discharge: 2013-03-22 | DRG: 552 | Disposition: A | Discharge status: Home Health | Payer: Medicare Other | Attending: Internal Medicine | Admitting: Internal Medicine

## 2013-03-18 DIAGNOSIS — M2550 Pain in unspecified joint: Secondary | ICD-10-CM

## 2013-03-18 DIAGNOSIS — I2510 Atherosclerotic heart disease of native coronary artery without angina pectoris: Secondary | ICD-10-CM | POA: Diagnosis present

## 2013-03-18 DIAGNOSIS — M48061 Spinal stenosis, lumbar region without neurogenic claudication: Principal | ICD-10-CM | POA: Diagnosis present

## 2013-03-18 DIAGNOSIS — K219 Gastro-esophageal reflux disease without esophagitis: Secondary | ICD-10-CM | POA: Diagnosis present

## 2013-03-18 DIAGNOSIS — I5032 Chronic diastolic (congestive) heart failure: Secondary | ICD-10-CM | POA: Diagnosis present

## 2013-03-18 DIAGNOSIS — R9430 Abnormal result of cardiovascular function study, unspecified: Secondary | ICD-10-CM

## 2013-03-18 DIAGNOSIS — M25579 Pain in unspecified ankle and joints of unspecified foot: Secondary | ICD-10-CM

## 2013-03-18 DIAGNOSIS — M5430 Sciatica, unspecified side: Secondary | ICD-10-CM | POA: Diagnosis present

## 2013-03-18 DIAGNOSIS — I129 Hypertensive chronic kidney disease with stage 1 through stage 4 chronic kidney disease, or unspecified chronic kidney disease: Secondary | ICD-10-CM | POA: Diagnosis present

## 2013-03-18 DIAGNOSIS — D72829 Elevated white blood cell count, unspecified: Secondary | ICD-10-CM

## 2013-03-18 DIAGNOSIS — E876 Hypokalemia: Secondary | ICD-10-CM | POA: Diagnosis present

## 2013-03-18 DIAGNOSIS — M545 Low back pain: Secondary | ICD-10-CM

## 2013-03-18 DIAGNOSIS — E785 Hyperlipidemia, unspecified: Secondary | ICD-10-CM | POA: Diagnosis present

## 2013-03-18 DIAGNOSIS — I059 Rheumatic mitral valve disease, unspecified: Secondary | ICD-10-CM | POA: Diagnosis present

## 2013-03-18 DIAGNOSIS — I739 Peripheral vascular disease, unspecified: Secondary | ICD-10-CM | POA: Diagnosis present

## 2013-03-18 DIAGNOSIS — N181 Chronic kidney disease, stage 1: Secondary | ICD-10-CM | POA: Diagnosis present

## 2013-03-18 DIAGNOSIS — R509 Fever, unspecified: Secondary | ICD-10-CM | POA: Diagnosis present

## 2013-03-18 DIAGNOSIS — Z79899 Other long term (current) drug therapy: Secondary | ICD-10-CM

## 2013-03-18 DIAGNOSIS — M76899 Other specified enthesopathies of unspecified lower limb, excluding foot: Secondary | ICD-10-CM | POA: Diagnosis present

## 2013-03-18 DIAGNOSIS — M7061 Trochanteric bursitis, right hip: Secondary | ICD-10-CM

## 2013-03-18 DIAGNOSIS — N189 Chronic kidney disease, unspecified: Secondary | ICD-10-CM | POA: Diagnosis present

## 2013-03-18 DIAGNOSIS — M7060 Trochanteric bursitis, unspecified hip: Secondary | ICD-10-CM | POA: Diagnosis present

## 2013-03-18 DIAGNOSIS — N179 Acute kidney failure, unspecified: Secondary | ICD-10-CM | POA: Diagnosis present

## 2013-03-18 DIAGNOSIS — G4730 Sleep apnea, unspecified: Secondary | ICD-10-CM | POA: Diagnosis present

## 2013-03-18 DIAGNOSIS — D638 Anemia in other chronic diseases classified elsewhere: Secondary | ICD-10-CM | POA: Diagnosis present

## 2013-03-18 DIAGNOSIS — M109 Gout, unspecified: Secondary | ICD-10-CM | POA: Diagnosis present

## 2013-03-18 DIAGNOSIS — I509 Heart failure, unspecified: Secondary | ICD-10-CM | POA: Diagnosis present

## 2013-03-18 DIAGNOSIS — E119 Type 2 diabetes mellitus without complications: Secondary | ICD-10-CM | POA: Diagnosis present

## 2013-03-18 DIAGNOSIS — Z87891 Personal history of nicotine dependence: Secondary | ICD-10-CM

## 2013-03-18 DIAGNOSIS — I5030 Unspecified diastolic (congestive) heart failure: Secondary | ICD-10-CM

## 2013-03-18 DIAGNOSIS — Z8673 Personal history of transient ischemic attack (TIA), and cerebral infarction without residual deficits: Secondary | ICD-10-CM

## 2013-03-18 HISTORY — DX: Urinary tract infection, site not specified: N39.0

## 2013-03-18 LAB — URINALYSIS, ROUTINE W REFLEX MICROSCOPIC
Glucose, UA: NEGATIVE mg/dL
Hgb urine dipstick: NEGATIVE
Ketones, ur: NEGATIVE mg/dL
Protein, ur: NEGATIVE mg/dL
Urobilinogen, UA: 0.2 mg/dL (ref 0.0–1.0)

## 2013-03-18 LAB — COMPREHENSIVE METABOLIC PANEL
ALT: 7 U/L (ref 0–35)
AST: 13 U/L (ref 0–37)
Albumin: 3.5 g/dL (ref 3.5–5.2)
CO2: 30 mEq/L (ref 19–32)
Chloride: 92 mEq/L — ABNORMAL LOW (ref 96–112)
GFR calc non Af Amer: 41 mL/min — ABNORMAL LOW (ref >90)
Potassium: 3.3 mEq/L — ABNORMAL LOW (ref 3.5–5.1)
Sodium: 134 mEq/L — ABNORMAL LOW (ref 135–145)
Total Bilirubin: 1.5 mg/dL — ABNORMAL HIGH (ref 0.3–1.2)

## 2013-03-18 LAB — CBC
Platelets: 369 10*3/uL (ref 150–400)
RBC: 3.89 MIL/uL (ref 3.87–5.11)
RDW: 13.4 % (ref 11.5–15.5)
WBC: 14.3 10*3/uL — ABNORMAL HIGH (ref 4.0–10.5)

## 2013-03-18 LAB — URINE MICROSCOPIC-ADD ON

## 2013-03-18 LAB — LIPASE, BLOOD: Lipase: 14 U/L (ref 11–59)

## 2013-03-18 MED ORDER — IOHEXOL 300 MG/ML  SOLN
80.0000 mL | Freq: Once | INTRAMUSCULAR | Status: AC | PRN
  Administered 2013-03-18: 80 mL via INTRAVENOUS

## 2013-03-18 MED ORDER — HYDROMORPHONE HCL PF 1 MG/ML IJ SOLN
1.0000 mg | Freq: Once | INTRAMUSCULAR | Status: AC
  Administered 2013-03-19: 0.5 mg via INTRAVENOUS
  Filled 2013-03-18: qty 1

## 2013-03-18 MED ORDER — IOHEXOL 300 MG/ML  SOLN
50.0000 mL | Freq: Once | INTRAMUSCULAR | Status: AC | PRN
  Administered 2013-03-18: 50 mL via ORAL

## 2013-03-18 MED ORDER — DIAZEPAM 5 MG/ML IJ SOLN: 5.0000 mg | Freq: Once | INTRAMUSCULAR | Status: DC

## 2013-03-18 MED ORDER — MORPHINE SULFATE 4 MG/ML IJ SOLN
4.0000 mg | Freq: Once | INTRAMUSCULAR | Status: AC
  Administered 2013-03-18: 4 mg via INTRAVENOUS
  Filled 2013-03-18: qty 1

## 2013-03-18 MED ORDER — DIAZEPAM 5 MG PO TABS
5.0000 mg | ORAL_TABLET | Freq: Once | ORAL | Status: AC
  Administered 2013-03-18: 5 mg via ORAL
  Filled 2013-03-18: qty 1

## 2013-03-18 MED ORDER — OXYCODONE-ACETAMINOPHEN 5-325 MG PO TABS
2.0000 | ORAL_TABLET | Freq: Once | ORAL | Status: AC
  Administered 2013-03-18: 2 via ORAL
  Filled 2013-03-18: qty 2

## 2013-03-18 MED ORDER — SODIUM CHLORIDE 0.9 % IV BOLUS (SEPSIS)
500.0000 mL | Freq: Once | INTRAVENOUS | Status: AC
  Administered 2013-03-18: 500 mL via INTRAVENOUS

## 2013-03-18 MED ORDER — IOHEXOL 300 MG/ML  SOLN: 50.0000 mL | Freq: Once | INTRAMUSCULAR | Status: DC | PRN

## 2013-03-18 MED ORDER — SODIUM CHLORIDE 0.9 % IV BOLUS (SEPSIS)
1000.0000 mL | Freq: Once | INTRAVENOUS | Status: AC
  Administered 2013-03-18: 1000 mL via INTRAVENOUS

## 2013-03-18 NOTE — ED Notes (Signed)
Patient transported to CT 

## 2013-03-18 NOTE — ED Provider Notes (Signed)
CSN: [id number]     Arrival date & time 03/18/13  1538 History     First MD Initiated Contact with Patient 03/18/13 1559     Chief Complaint  Patient presents with  . Weakness    x 3 days  . Shoulder Pain    keft side x 1 day  . Hip Pain     right side x 3 days  . Urinary Tract Infection    2 weeks ago   (Consider location/radiation/quality/duration/timing/severity/associated sxs/prior Treatment) Patient is a 66 y.o. female presenting with weakness. The history is provided by the patient and medical records. No language interpreter was used.  Weakness This is a new problem. The current episode started in the past 7 days. The problem occurs constantly. The problem has been gradually worsening. Associated symptoms include arthralgias, chills and weakness. Pertinent negatives include no abdominal pain, chest pain, coughing, diaphoresis, fatigue, fever, headaches, nausea, rash or vomiting.    [name] is a 67 y.o. female  with a hx of HTN, CAD (cardiac cath 2006 with 2 vessel non obstructive disease), GERD, DM presents to the Emergency Department complaining of gradual, persistent, progressively worsening generalized weakness along with right lower back pain.  Pt states he pain began 4 days ago in the Left lower back but after 1 day moved to the right lower back and then began to radiate into her right hip and posterior right thigh.  Associated symptoms include decreased PO intake 2/2 difficulty moving.  Nothing makes it better and movement and palpation makes it worse. Patient endorses subjective hot and cold chills Pt denies measured fever, chest pain, shortness of breath, abdominal pain, nausea, vomiting, diarrhea, numbness, tingling, dysuria, hematuria.    Record review shows the patient was seen on 02/27/2013 in urgent medical and family care. At that time she was complaining of lower back pain. Note states that exam clinically showed musculoskeletal pain PMD was concerned about  possible pyelonephritis. She was DC'd with oral antibiotics and instructed to followup 2 days later. There is no record of her follow-up.   Past Medical History  Diagnosis Date  . Atypical face pain   . Hypertension   . CAD (coronary artery disease)     Catheterization 2006, mild two-vessel nonobstructive disease  . GERD (gastroesophageal reflux disease)   . Headache(784.0)   . Hyperlipidemia   . Arthritis   . Sleep apnea     CPAP compliance ???  . Diabetes mellitus   . Shortness of breath   . PAD (peripheral artery disease)     Dr. Darrick Penna,  Bilateral superficial femoral artery occlusions with one vessel runoff via the peroneal artery  . Ejection fraction     EF 60%, echo, October, 2010,  . Urinary incontinence   . Overweight(278.02)   . Mitral regurgitation     Mild, echo, October, 2010  . Fluid overload   . Foot pain, right     November, 2012  . Stroke   . UTI (lower urinary tract infection)    Past Surgical History  Procedure Laterality Date  . Cesarean section    . Abdominal hysterectomy    . Eye surgery  12/11/10  . Vesicovaginal fistula closure w/ tah    . Artery biopsy  12/27/2011    Procedure: BIOPSY TEMPORAL ARTERY;  Surgeon: Sherren Kerns, MD;  Location: [facility] OR;  Service: Vascular;  Laterality: Left;   Family History  Problem Relation Age of Onset  . Diabetes Mother   .  Hyperlipidemia Mother   . Hypertension Mother   . Heart disease Father   . Diabetes Sister   . Hypertension Sister   . Hyperlipidemia Sister   . Anesthesia problems Neg Hx    History  Substance Use Topics  . Smoking status: Former Smoker    Types: Cigarettes    Quit date: 11/13/2010  . Smokeless tobacco: Not on file  . Alcohol Use: Yes     Comment: Social Drinker only/////////STOP DRINKING IN 2001   OB History   Grav Para Term Preterm Abortions TAB SAB Ect Mult Living                 Review of Systems  Constitutional: Positive for chills. Negative for fever, diaphoresis,  appetite change, fatigue and unexpected weight change.  HENT: Negative for mouth sores and neck stiffness.   Eyes: Negative for visual disturbance.  Respiratory: Negative for cough, chest tightness, shortness of breath and wheezing.   Cardiovascular: Negative for chest pain.  Gastrointestinal: Negative for nausea, vomiting, abdominal pain, diarrhea and constipation.  Endocrine: Negative for polydipsia, polyphagia and polyuria.  Genitourinary: Negative for dysuria, urgency, frequency and hematuria.  Musculoskeletal: Positive for back pain, arthralgias and gait problem (2/2 pain).  Skin: Negative for rash.  Allergic/Immunologic: Negative for immunocompromised state.  Neurological: Positive for weakness. Negative for syncope, light-headedness and headaches.  Hematological: Does not bruise/bleed easily.  Psychiatric/Behavioral: Negative for sleep disturbance. The patient is not nervous/anxious.     Allergies  Amoxicillin and Latex  Home Medications   Current Outpatient Rx  Name  Route  Sig  Dispense  Refill  . aspirin 325 MG tablet   Oral   Take 325 mg by mouth daily.          Marland Kitchen atorvastatin (LIPITOR) 20 MG tablet   Oral   Take 20 mg by mouth daily.          . citalopram (CELEXA) 20 MG tablet   Oral   Take 20 mg by mouth daily.          . colchicine 0.6 MG tablet   Oral   Take 1-2 tablets (0.6-1.2 mg total) by mouth as needed. Take two pills once, then take another an hour later.  Take one daily until resolved   30 tablet   1   . EXPIRED: furosemide (LASIX) 40 MG tablet   Oral   Take 40 mg by mouth 2 (two) times daily.         Marland Kitchen gabapentin (NEURONTIN) 300 MG capsule   Oral   Take 300 mg by mouth daily.         . hydrochlorothiazide (HYDRODIURIL) 25 MG tablet   Oral   Take 25 mg by mouth daily.          Marland Kitchen lisinopril (PRINIVIL,ZESTRIL) 40 MG tablet   Oral   Take 40 mg by mouth daily.         . metFORMIN (GLUCOPHAGE) 1000 MG tablet   Oral   Take 1,000  mg by mouth 2 (two) times daily with a meal.         . metoprolol (LOPRESSOR) 100 MG tablet               . Olmesartan-Amlodipine-HCTZ (TRIBENZOR) 40-5-25 MG TABS   Oral   Take 1 tablet by mouth daily.          . potassium chloride SA (K-DUR,KLOR-CON) 20 MEQ tablet   Oral   Take 20 mEq by mouth  daily.         . simvastatin (ZOCOR) 40 MG tablet   Oral   Take 20 mg by mouth at bedtime.         . traMADol (ULTRAM) 50 MG tablet   Oral   Take 50-100 mg by mouth every 8 (eight) hours as needed. For pain         . traZODone (DESYREL) 100 MG tablet   Oral   Take 100 mg by mouth at bedtime.          . triamcinolone (KENALOG) 0.1 % cream   Topical   Apply 1 application topically 2 (two) times daily.          Marland Kitchen zolpidem (AMBIEN) 10 MG tablet   Oral   Take 10 mg by mouth at bedtime as needed. For sleep         . naproxen (NAPROSYN) 500 MG tablet   Oral   Take 1 tablet (500 mg total) by mouth 2 (two) times daily with a meal.   30 tablet   0   . nitroGLYCERIN (NITROSTAT) 0.4 MG SL tablet   Sublingual   Place 1 tablet (0.4 mg total) under the tongue every 5 (five) minutes as needed. For chest pain   25 tablet   11    BP 149/89  Pulse 99  Temp(Src) 98 F (36.7 C) (Oral)  Resp 18  Ht 5' 2.5" (1.588 m)  Wt 180 lb (81.647 kg)  BMI 32.38 kg/m2  SpO2 96% Physical Exam  Nursing note and vitals reviewed. Constitutional: She appears well-developed and well-nourished. No distress.  HENT:  Head: Normocephalic and atraumatic.  Mouth/Throat: Oropharynx is clear and moist. No oropharyngeal exudate.  Eyes: Conjunctivae and EOM are normal. Pupils are equal, round, and reactive to light.  Neck: Normal range of motion. Neck supple.  Full ROM without pain  Cardiovascular: Regular rhythm, S1 normal, S2 normal, normal heart sounds and intact distal pulses.  Tachycardia present.   Pulses:      Radial pulses are 2+ on the right side, and 2+ on the left side.        Dorsalis pedis pulses are 2+ on the right side, and 2+ on the left side.       Posterior tibial pulses are 2+ on the right side, and 2+ on the left side.  Pulmonary/Chest: Effort normal and breath sounds normal. No respiratory distress. She has no wheezes. She has no rales.  Abdominal: Soft. Normal appearance and bowel sounds are normal. She exhibits no distension. There is no tenderness. There is no rigidity, no rebound and no guarding.  Musculoskeletal:       Right hip: She exhibits decreased range of motion (2/2 illicitation of back pain ) and decreased strength (2/2 to pain). She exhibits no bony tenderness, no swelling, no crepitus and no deformity.       Lumbar back: She exhibits decreased range of motion (2/2 pain), tenderness, bony tenderness (SI joint) and pain. She exhibits no deformity and no laceration.       Back:  Full range of motion of the T-spine and L-spine No tenderness to palpation of the spinous processes of the T-spine or L-spine Significant tenderness to palpation of the right paraspinous muscles of the L-spine and the right SI joint  Lymphadenopathy:    She has no cervical adenopathy.  Neurological: She is alert. She has normal reflexes. GCS eye subscore is 4. GCS verbal subscore is 5. GCS motor subscore  is 6.  Speech is clear and goal oriented, follows commands Normal strength in upper extremities bilaterally including strong and equal grip strength Decreased strength in lower extremities 2/2 to pain with strong and equal dorsiflexion and platar flexion.   Sensation normal to light and sharp touch Moves extremities without ataxia, coordination intact Gait not assess at this time 2/2 to severe pain  Skin: Skin is warm and dry. No rash noted. She is not diaphoretic. No erythema.    ED Course   Procedures (including critical care time)  Labs Reviewed  URINALYSIS, ROUTINE W REFLEX MICROSCOPIC - Abnormal; Notable for the following:    Color, Urine AMBER (*)     APPearance CLOUDY (*)    Leukocytes, UA SMALL (*)    All other components within normal limits  CBC - Abnormal; Notable for the following:    WBC 14.3 (*)    Hemoglobin 10.8 (*)    HCT 32.6 (*)    All other components within normal limits  COMPREHENSIVE METABOLIC PANEL - Abnormal; Notable for the following:    Sodium 134 (*)    Potassium 3.3 (*)    Chloride 92 (*)    Glucose, Bld 153 (*)    BUN 33 (*)    Creatinine, Ser 1.30 (*)    Total Protein 9.0 (*)    Alkaline Phosphatase 123 (*)    Total Bilirubin 1.5 (*)    GFR calc non Af Amer 41 (*)    GFR calc Af Amer 47 (*)    All other components within normal limits  GLUCOSE, CAPILLARY - Abnormal; Notable for the following:    Glucose-Capillary 134 (*)    All other components within normal limits  CULTURE, BLOOD (ROUTINE X 2)  CULTURE, BLOOD (ROUTINE X 2)  TROPONIN I  URINE MICROSCOPIC-ADD ON  LIPASE, BLOOD  URIC ACID   Date: 03/18/2013  Rate: 109  Rhythm: Sinus tachycardia  QRS Axis: normal  Intervals: normal  ST/T Wave abnormalities: normal  Conduction Disutrbances: none  Narrative Interpretation: nonischemic ECG, unchanged from 02/01/13 Old EKG Reviewed: Unchanged  Dg Chest 2 View  03/18/2013   CLINICAL DATA:  Weakness. History of diabetes and mitral regurgitation.  EXAM: CHEST  2 VIEW  COMPARISON:  12/27/2011.  FINDINGS: There is suboptimal inspiration on the lateral view. The heart size and mediastinal contours are stable with aortic atherosclerosis. The lungs appear clear. There is no pleural effusion or pneumothorax. No acute osseous findings are seen. Telemetry leads overlie the chest.  IMPRESSION: No active cardiopulmonary process.   Electronically Signed   By: Roxy Horseman   On: 03/18/2013 18:57   Ct Abdomen Pelvis W Contrast  03/18/2013   *RADIOLOGY REPORT*  Clinical Data: Abdominal pain, recent urinary tract infection, generalized weakness  CT ABDOMEN AND PELVIS WITH CONTRAST  Technique:  Multidetector CT imaging  of the abdomen and pelvis was performed following the standard protocol during bolus administration of intravenous contrast.  Contrast: 80mL OMNIPAQUE IOHEXOL 300 MG/ML  SOLN, 50mL OMNIPAQUE IOHEXOL 300 MG/ML  SOLN  Comparison: None.  Findings:  Normal hepatic contour.  There is a sub centimeter hypoattenuating lesion in the dome of the right lobe the liver (image 8, series 2) which is too small to accurately characterize.  Post cholecystectomy.  There is dilatation of the common bile duct measuring approximately 1.4 cm in greatest oblique coronal dimension (coronal image 41, series 3).  There is minimal centralized intrahepatic biliary duct dilatation, presumably sequelae of postcholecystectomy state.  The  pancreas is atrophic but otherwise normal.  No peripancreatic stranding.  No ascites.  There is symmetric enhancement and excretion of the bilateral kidneys.  Punctate calcifications about the bilateral splenic hila are favored to be vascular in etiology.  No definite renal stones. No urinary obstruction or perinephric stranding. Note is made of a small (approximately 1.8 x 1.5 cm diverticulum arising from the left lateral aspect of the dome of the urinary bladder (image 72, series 2).  Normal appearance of the bilateral adrenal glands. Normal appearance of the spleen.  Incidental note is made of a small splenule.  A minimal amount of enteric contrast has been ingested is seen extending to the level of the distal small bowel.  Moderate colonic stool burden without evidence of obstruction.  Colonic diverticulosis without evidence of diverticulitis.  No pneumoperitoneum, pneumatosis or portal venous gas.  Scattered atherosclerotic plaque within a normal caliber abdominal aorta.  The major branch vessels of the abdominal aorta appear patent on this non CTA examination.  No definite retroperitoneal, mesenteric, pelvic or inguinal lymphadenopathy.  Post hysterectomy.  No discrete adnexal lesion.  No free fluid within  the pelvis.  Limited visualization of the lower thorax demonstrates minimal bibasilar dependent ground-glass atelectasis.  There is a small (approximately 1.6 cm cyst within the imaged right lower lobe as well as a smaller sub centimeter cyst within the right middle lobe. No focal airspace opacities.  No pleural effusion.  Normal heart size.  No pericardial effusion.  No acute or aggressive osseous abnormalities.  Mild to moderate multilevel lumbar spine DDD.  IMPRESSION: 1.  No acute findings within the abdomen or pelvis.  Specifically, no evidence of enteric or urinary obstruction. 2.  Moderate colonic stool burden.  Colonic diverticulosis without evidence of diverticulitis. 3.  Mild intra and extrahepatic biliary ductal dilatation, presumably the sequela of postcholecystectomy state.   Original Report Authenticated By: Tacey Ruiz, MD   1. Pain, joint, multiple sites   2. Low back pain   3. Fever   4. Leukocytosis     MDM  [name] presents with back pain and physical exam consistent with sciatica.  However, pt is found to be tachycardic and febrile on arrival.   No neurological deficits and normal neuro exam.  Patient can walk but states is painful.  No loss of bowel or bladder control.  No concern for cauda equina.  No fever, night sweats, weight loss, h/o cancer, IVDU.  RICE protocol and pain medicine indicated and discussed with patient.   10:16 PM CT scan abdomen and pelvis without abnormality.  No noted compression fractures or spinal abscesses. CBC with leukocytosis of 14.3, mildly elevated BUN and creatinine, hypokalemia 3.3. Troponin negative and ECG nonischemic and urinalysis evidence of urinary tract infection.  Chest x-ray without evidence of pneumonia.  12:30 AM Patient unable to ambulate after multiple doses of pain medications. Will proceed with admission.  Dr. Patria Mane was consulted, evaluated this patient with me and agrees with the plan.      Dahlia Client Khayri Kargbo,  PA-C 03/19/13 0031

## 2013-03-18 NOTE — ED Provider Notes (Signed)
Date: 03/18/2013  Rate: 109  Rhythm: Sinus tachycardia  QRS Axis: normal  Intervals: normal  ST/T Wave abnormalities: normal  Conduction Disutrbances: none  Narrative Interpretation:   Old EKG Reviewed: No significant changes noted     Lyanne Co, MD 03/18/13 [phone]

## 2013-03-18 NOTE — ED Notes (Signed)
Patient sat up on edge of bed with assistant to attempt ambulation. Patient stated that she was unable to stand up due to the sharp pain in her back and going down her right leg.

## 2013-03-18 NOTE — ED Notes (Signed)
Per EMS- Pt reports UTI 2 weeks - seen at PCP and finished meds. Pt reports generalized weakness since Thursday.  Pain to left shoulder and right hip pain. Denies injury. Pt reports unable to get OOB

## 2013-03-19 ENCOUNTER — Encounter (HOSPITAL_COMMUNITY): Payer: Self-pay | Admitting: *Deleted

## 2013-03-19 ENCOUNTER — Observation Stay (HOSPITAL_COMMUNITY): Payer: Medicare Other

## 2013-03-19 DIAGNOSIS — M2550 Pain in unspecified joint: Secondary | ICD-10-CM

## 2013-03-19 DIAGNOSIS — E119 Type 2 diabetes mellitus without complications: Secondary | ICD-10-CM

## 2013-03-19 DIAGNOSIS — M7060 Trochanteric bursitis, unspecified hip: Secondary | ICD-10-CM | POA: Diagnosis present

## 2013-03-19 DIAGNOSIS — M76899 Other specified enthesopathies of unspecified lower limb, excluding foot: Secondary | ICD-10-CM

## 2013-03-19 LAB — GLUCOSE, CAPILLARY: Glucose-Capillary: 142 mg/dL — ABNORMAL HIGH (ref 70–99)

## 2013-03-19 LAB — BASIC METABOLIC PANEL
BUN: 25 mg/dL — ABNORMAL HIGH (ref 6–23)
Creatinine, Ser: 1.18 mg/dL — ABNORMAL HIGH (ref 0.50–1.10)
GFR calc non Af Amer: 46 mL/min — ABNORMAL LOW (ref >90)
Glucose, Bld: 150 mg/dL — ABNORMAL HIGH (ref 70–99)
Potassium: 2.9 mEq/L — ABNORMAL LOW (ref 3.5–5.1)

## 2013-03-19 LAB — CBC
HCT: 28.3 % — ABNORMAL LOW (ref 36.0–46.0)
Hemoglobin: 9.2 g/dL — ABNORMAL LOW (ref 12.0–15.0)
MCHC: 32.5 g/dL (ref 30.0–36.0)
MCV: 84.5 fL (ref 78.0–100.0)

## 2013-03-19 LAB — URIC ACID
Uric Acid, Serum: 14.5 mg/dL — ABNORMAL HIGH (ref 2.4–7.0)
Uric Acid, Serum: 12 mg/dL — ABNORMAL HIGH (ref 2.4–7.0)

## 2013-03-19 MED ORDER — BLISTEX EX OINT
TOPICAL_OINTMENT | CUTANEOUS | Status: AC
  Administered 2013-03-19: 23:00:00
  Filled 2013-03-19: qty 10

## 2013-03-19 MED ORDER — INSULIN ASPART 100 UNIT/ML ~~LOC~~ SOLN
0.0000 [IU] | Freq: Three times a day (TID) | SUBCUTANEOUS | Status: DC
  Administered 2013-03-19 (×2): 2 [IU] via SUBCUTANEOUS
  Administered 2013-03-19: 3 [IU] via SUBCUTANEOUS
  Administered 2013-03-20: 2 [IU] via SUBCUTANEOUS

## 2013-03-19 MED ORDER — HEPARIN SODIUM (PORCINE) 5000 UNIT/ML IJ SOLN
5000.0000 [IU] | Freq: Three times a day (TID) | INTRAMUSCULAR | Status: DC
  Administered 2013-03-19 – 2013-03-22 (×9): 5000 [IU] via SUBCUTANEOUS
  Filled 2013-03-19 (×13): qty 1

## 2013-03-19 MED ORDER — HYDROCODONE-ACETAMINOPHEN 5-325 MG PO TABS
1.0000 | ORAL_TABLET | Freq: Four times a day (QID) | ORAL | Status: DC | PRN
  Administered 2013-03-19 (×4): 2 via ORAL
  Filled 2013-03-19 (×5): qty 2

## 2013-03-19 MED ORDER — NAPROXEN 375 MG PO TABS
375.0000 mg | ORAL_TABLET | Freq: Two times a day (BID) | ORAL | Status: DC | PRN
  Administered 2013-03-19 (×2): 375 mg via ORAL
  Filled 2013-03-19 (×3): qty 1

## 2013-03-19 NOTE — Progress Notes (Signed)
4:39 PM I agree with HPI/GPe and A/P per Dr. Julian Reil      67 yr old AAF, known h/o gout, presented with LBP less than 1 weeks duration.  States started all of a sudden on awakening.  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 Trochanteric bursitis 03/19/2013  . Lumbar spondylosis with myelopathy 02/27/2013  . Giant cell arteritis 12/23/2011  . Foot pain, right   . CAD (coronary artery disease)   . GERD (gastroesophageal reflux disease)   . Sleep apnea   . PAD (peripheral artery disease)   . Ejection fraction   . Mitral regurgitation   . FLUID OVERLOAD 06/04/2009  . HYPOKALEMIA 06/04/2009  . SHORTNESS OF BREATH 05/07/2009  . DM 05/06/2009  . DYSLIPIDEMIA 05/06/2009  . OVERWEIGHT 05/06/2009  . HYPERTENSION 05/06/2009   Ortho input appreciated-await MRI lower back and further w/u per Ortho  Pleas Koch, MD [facility] (P) [phone]

## 2013-03-19 NOTE — ED Notes (Addendum)
Pt has sleep apnea admitting MD notified and orders given RRT paged and made aware of pending order for cpap.  Receiving RN for [facility] [id number] also made aware.

## 2013-03-19 NOTE — H&P (Signed)
[facility] History and Physical  [name] GNF:[id number] DOB: July 25, 1946 DOA: 03/18/2013  Referring physician: ED PCP: Warrick Parisian, MD  Chief Complaint: Back, hip, leg pain  HPI: [name] is a 67 y.o. female who presents with 7 day history of worsening back, hip and R leg pain.  Pain is very severe to the point where she cannot ambulate for the past couple of days.  Pain is worse with palpation over the greater trochanter of her R femur.  In the ED she did have a WBC of 14.3k and temperature of 99.4 but work up including CT abd/pelvis was negative for source of infection, pain was unable to be controlled and patient unable to walk so hospitalist has been called for the admit.  Review of Systems: 12 systems reviewed and otherwise negative.  Past Medical History  Diagnosis Date  . Atypical face pain   . Hypertension   . CAD (coronary artery disease)     Catheterization 2006, mild two-vessel nonobstructive disease  . GERD (gastroesophageal reflux disease)   . Headache(784.0)   . Hyperlipidemia   . Arthritis   . Sleep apnea     CPAP compliance ???  . Diabetes mellitus   . Shortness of breath   . PAD (peripheral artery disease)     Dr. Darrick Penna,  Bilateral superficial femoral artery occlusions with one vessel runoff via the peroneal artery  . Ejection fraction     EF 60%, echo, October, 2010,  . Urinary incontinence   . Overweight(278.02)   . Mitral regurgitation     Mild, echo, October, 2010  . Fluid overload   . Foot pain, right     November, 2012  . Stroke   . UTI (lower urinary tract infection)    Past Surgical History  Procedure Laterality Date  . Cesarean section    . Abdominal hysterectomy    . Eye surgery  12/11/10  . Vesicovaginal fistula closure w/ tah    . Artery biopsy  12/27/2011    Procedure: BIOPSY TEMPORAL ARTERY;  Surgeon: Sherren Kerns, MD;  Location: [facility] OR;  Service: Vascular;  Laterality: Left;   Social History:  reports that she  quit smoking about 2 years ago. Her smoking use included Cigarettes. She smoked 0.00 packs per day. She does not have any smokeless tobacco history on file. She reports that  drinks alcohol. She reports that she does not use illicit drugs.   Allergies  Allergen Reactions  . Amoxicillin     Yeast infection  . Latex Itching    Family History  Problem Relation Age of Onset  . Diabetes Mother   . Hyperlipidemia Mother   . Hypertension Mother   . Heart disease Father   . Diabetes Sister   . Hypertension Sister   . Hyperlipidemia Sister   . Anesthesia problems Neg Hx     Prior to Admission medications   Medication Sig Start Date End Date Taking? Authorizing Provider  aspirin 325 MG tablet Take 325 mg by mouth daily.    Yes Historical Provider, MD  atorvastatin (LIPITOR) 20 MG tablet Take 20 mg by mouth daily.  01/09/13  Yes Historical Provider, MD  citalopram (CELEXA) 20 MG tablet Take 20 mg by mouth daily.    Yes Historical Provider, MD  colchicine 0.6 MG tablet Take 1-2 tablets (0.6-1.2 mg total) by mouth as needed. Take two pills once, then take another an hour later.  Take one daily until resolved 02/10/13 02/10/14 Yes Thao  P Le, DO  furosemide (LASIX) 40 MG tablet Take 40 mg by mouth 2 (two) times daily. 06/10/11 03/18/13 Yes Luis Abed, MD  gabapentin (NEURONTIN) 300 MG capsule Take 300 mg by mouth daily. 10/19/11  Yes Historical Provider, MD  hydrochlorothiazide (HYDRODIURIL) 25 MG tablet Take 25 mg by mouth daily.  10/25/12  Yes Historical Provider, MD  lisinopril (PRINIVIL,ZESTRIL) 40 MG tablet Take 40 mg by mouth daily.   Yes Historical Provider, MD  metFORMIN (GLUCOPHAGE) 1000 MG tablet Take 1,000 mg by mouth 2 (two) times daily with a meal.   Yes Historical Provider, MD  metoprolol (LOPRESSOR) 100 MG tablet  06/14/12  Yes Historical Provider, MD  Olmesartan-Amlodipine-HCTZ (TRIBENZOR) 40-5-25 MG TABS Take 1 tablet by mouth daily.    Yes Historical Provider, MD  potassium  chloride SA (K-DUR,KLOR-CON) 20 MEQ tablet Take 20 mEq by mouth daily. 10/19/11  Yes Historical Provider, MD  simvastatin (ZOCOR) 40 MG tablet Take 20 mg by mouth at bedtime.   Yes Historical Provider, MD  traMADol (ULTRAM) 50 MG tablet Take 50-100 mg by mouth every 8 (eight) hours as needed. For pain 10/26/11  Yes Historical Provider, MD  traZODone (DESYREL) 100 MG tablet Take 100 mg by mouth at bedtime.  01/09/13  Yes Historical Provider, MD  triamcinolone (KENALOG) 0.1 % cream Apply 1 application topically 2 (two) times daily.    Yes Historical Provider, MD  zolpidem (AMBIEN) 10 MG tablet Take 10 mg by mouth at bedtime as needed. For sleep 04/15/11  Yes Historical Provider, MD  naproxen (NAPROSYN) 500 MG tablet Take 1 tablet (500 mg total) by mouth 2 (two) times daily with a meal. 04/23/12   Ethelda Chick, MD  nitroGLYCERIN (NITROSTAT) 0.4 MG SL tablet Place 1 tablet (0.4 mg total) under the tongue every 5 (five) minutes as needed. For chest pain 01/17/13   Luis Abed, MD   Physical Exam: Filed Vitals:   03/18/13 2130  BP: 149/89  Pulse:   Temp:   Resp: 18    General:  NAD, resting comfortably in bed Eyes: PEERLA EOMI ENT: mucous membranes moist Neck: supple w/o JVD Cardiovascular: RRR w/o MRG Respiratory: CTA B Abdomen: soft, nt, nd, bs+ Skin: no rash nor lesion Musculoskeletal: MAE, severe tenderness over R femur greater trochanter Psychiatric: normal tone and affect Neurologic: AAOx3, grossly non-focal  Labs on Admission:  Basic Metabolic Panel:  Recent Labs Lab 03/18/13 1650  NA 134*  K 3.3*  CL 92*  CO2 30  GLUCOSE 153*  BUN 33*  CREATININE 1.30*  CALCIUM 9.7   Liver Function Tests:  Recent Labs Lab 03/18/13 1650  AST 13  ALT 7  ALKPHOS 123*  BILITOT 1.5*  PROT 9.0*  ALBUMIN 3.5    Recent Labs Lab 03/18/13 1650  LIPASE 14   No results found for this basename: AMMONIA,  in the last 168 hours CBC:  Recent Labs Lab 03/18/13 1650  WBC 14.3*  HGB  10.8*  HCT 32.6*  MCV 83.8  PLT 369   Cardiac Enzymes:  Recent Labs Lab 03/18/13 1650  TROPONINI <0.30    BNP (last 3 results) No results found for this basename: PROBNP,  in the last 8760 hours CBG:  Recent Labs Lab 03/18/13 1618  GLUCAP 134*    Radiological Exams on Admission: Dg Chest 2 View  03/18/2013   CLINICAL DATA:  Weakness. History of diabetes and mitral regurgitation.  EXAM: CHEST  2 VIEW  COMPARISON:  12/27/2011.  FINDINGS: There  is suboptimal inspiration on the lateral view. The heart size and mediastinal contours are stable with aortic atherosclerosis. The lungs appear clear. There is no pleural effusion or pneumothorax. No acute osseous findings are seen. Telemetry leads overlie the chest.  IMPRESSION: No active cardiopulmonary process.   Electronically Signed   By: Roxy Horseman   On: 03/18/2013 18:57   Ct Abdomen Pelvis W Contrast  03/18/2013   *RADIOLOGY REPORT*  Clinical Data: Abdominal pain, recent urinary tract infection, generalized weakness  CT ABDOMEN AND PELVIS WITH CONTRAST  Technique:  Multidetector CT imaging of the abdomen and pelvis was performed following the standard protocol during bolus administration of intravenous contrast.  Contrast: 80mL OMNIPAQUE IOHEXOL 300 MG/ML  SOLN, 50mL OMNIPAQUE IOHEXOL 300 MG/ML  SOLN  Comparison: None.  Findings:  Normal hepatic contour.  There is a sub centimeter hypoattenuating lesion in the dome of the right lobe the liver (image 8, series 2) which is too small to accurately characterize.  Post cholecystectomy.  There is dilatation of the common bile duct measuring approximately 1.4 cm in greatest oblique coronal dimension (coronal image 41, series 3).  There is minimal centralized intrahepatic biliary duct dilatation, presumably sequelae of postcholecystectomy state.  The pancreas is atrophic but otherwise normal.  No peripancreatic stranding.  No ascites.  There is symmetric enhancement and excretion of the bilateral  kidneys.  Punctate calcifications about the bilateral splenic hila are favored to be vascular in etiology.  No definite renal stones. No urinary obstruction or perinephric stranding. Note is made of a small (approximately 1.8 x 1.5 cm diverticulum arising from the left lateral aspect of the dome of the urinary bladder (image 72, series 2).  Normal appearance of the bilateral adrenal glands. Normal appearance of the spleen.  Incidental note is made of a small splenule.  A minimal amount of enteric contrast has been ingested is seen extending to the level of the distal small bowel.  Moderate colonic stool burden without evidence of obstruction.  Colonic diverticulosis without evidence of diverticulitis.  No pneumoperitoneum, pneumatosis or portal venous gas.  Scattered atherosclerotic plaque within a normal caliber abdominal aorta.  The major branch vessels of the abdominal aorta appear patent on this non CTA examination.  No definite retroperitoneal, mesenteric, pelvic or inguinal lymphadenopathy.  Post hysterectomy.  No discrete adnexal lesion.  No free fluid within the pelvis.  Limited visualization of the lower thorax demonstrates minimal bibasilar dependent ground-glass atelectasis.  There is a small (approximately 1.6 cm cyst within the imaged right lower lobe as well as a smaller sub centimeter cyst within the right middle lobe. No focal airspace opacities.  No pleural effusion.  Normal heart size.  No pericardial effusion.  No acute or aggressive osseous abnormalities.  Mild to moderate multilevel lumbar spine DDD.  IMPRESSION: 1.  No acute findings within the abdomen or pelvis.  Specifically, no evidence of enteric or urinary obstruction. 2.  Moderate colonic stool burden.  Colonic diverticulosis without evidence of diverticulitis. 3.  Mild intra and extrahepatic biliary ductal dilatation, presumably the sequela of postcholecystectomy state.   Original Report Authenticated By: Tacey Ruiz, MD    EKG:  Independently reviewed.  Assessment/Plan Principal Problem:   Trochanteric bursitis Active Problems:   DM   1. Trochanteric bursitis - needs ortho to eval patient in AM.  Doubt that this is septic bursitis (not red nor inflamed), so may just end up needing steroid / lidocaine injection into bursa (ortho to decide). 2. DM -  holding metformin and putting patient on med dose SSI AC/HS 3. Fever and Leukocytosis - unclear for a specific source of infection, patient does have long history of gout so this could be due to gout, not really erythema to suggest that her bursitis is septic at this point.  Remainder of infectious work up in ED negative thus far.  Ordering blood culture for temperature greater than 100.4.  Uric acid level pending.    Code Status: Full Code (must indicate code status--if unknown or must be presumed, indicate so) Family Communication: Spoke with daughter at bedside (indicate person spoken with, if applicable, with phone number if by telephone) Disposition Plan: Admit to obs (indicate anticipated LOS)  Time spent: 70 min  GARDNER, JARED M. [facility] Pager [phone]  If 7PM-7AM, please contact night-coverage [email or web address] Password [email or web address] 03/19/2013, 1:14 AM

## 2013-03-19 NOTE — Progress Notes (Signed)
RT placed patient on CPAP. Place patient on auto titraite settings. Patients seems to be tolerating well. Asked patient to call RN if she has problems with CPAP machine. 2 liters of oxygen is bleed into CPAP machine. Will continue to monitor

## 2013-03-19 NOTE — Consult Note (Signed)
Reason for Consult:  Right Hip Pain  Referring Physician:  Dr. [name] is an 67 y.o. female.  HPI: Patient is a 67 year old female admitted for severe right hip pain by the medical service.  She states her pain started up last week when she woke up with left sided back pain on Thursday morning.  She was able to go to work on Wednesday but had pain that next morning that was severe enough to keep her in bed.  By Friday morning, the pain had moved over to the right side of her low back.  It progressed over the weekend and started to radiate down through the buttock into the back of the thigh.  It moved down below the knee and into the calf by today.  She does not recall any numbness or tingling associated with the onset, mainly describes it as severe pain.  She has been able to void but denies bowel movements since last Tuesday.  No loss of bowel or bladder except for the constipation complaint.  She denies any falls or trauma associated with the onset of the pain.  She denies any previous episodes of back pain similar to this.  She does, however, have some PAD followed by Dr. Fabienne Bruns, which has caused some vascular claudication in the past.  She denies any groin pain or popping with the hip, but she does have pain ont he lateral hip region. She was also noted to have a painful right knee which was found to have swelling.  This could be gout so will check a uric acid level and check films. She gives a history of a gout flare up about two weeks ago that sent her to the doctor and she underwent xrays and urinalysis and was told that she had a bad UTI.  She did have an episode of back pain at that time but no radicular symptoms.  Her back pain improved after treatment for the UTI as per the patient.  She came into the ED earlier today for the above complaints and was found to be unable to walk due to the pain.  She was subsequently admitted and placed at bedrest.  Past Medical History    Diagnosis Date  . Atypical face pain   . Hypertension   . CAD (coronary artery disease)     Catheterization 2006, mild two-vessel nonobstructive disease  . GERD (gastroesophageal reflux disease)   . Headache(784.0)   . Hyperlipidemia   . Arthritis   . Sleep apnea     CPAP compliance ???  . Diabetes mellitus   . Shortness of breath   . PAD (peripheral artery disease)     Dr. Darrick Penna,  Bilateral superficial femoral artery occlusions with one vessel runoff via the peroneal artery  . Ejection fraction     EF 60%, echo, October, 2010,  . Urinary incontinence   . Overweight(278.02)   . Mitral regurgitation     Mild, echo, October, 2010  . Fluid overload   . Foot pain, right     November, 2012  . Stroke   . UTI (lower urinary tract infection)     Past Surgical History  Procedure Laterality Date  . Cesarean section    . Abdominal hysterectomy    . Eye surgery  12/11/10  . Vesicovaginal fistula closure w/ tah    . Artery biopsy  12/27/2011    Procedure: BIOPSY TEMPORAL ARTERY;  Surgeon: Sherren Kerns, MD;  Location:  [facility] OR;  Service: Vascular;  Laterality: Left;    Family History  Problem Relation Age of Onset  . Diabetes Mother   . Hyperlipidemia Mother   . Hypertension Mother   . Heart disease Father   . Diabetes Sister   . Hypertension Sister   . Hyperlipidemia Sister   . Anesthesia problems Neg Hx     Social History:  reports that she quit smoking about 2 years ago. Her smoking use included Cigarettes. She smoked 0.00 packs per day. She does not have any smokeless tobacco history on file. She reports that  drinks alcohol. She reports that she does not use illicit drugs.  Allergies:  Allergies  Allergen Reactions  . Amoxicillin     Yeast infection  . Latex Itching    Medications:  Prior to Admission Medications Needing Review    Medication Details Provider Last Reconciliation Status   aspirin 325 MG tablet Take 325 mg by mouth daily.  Historical Provider, MD  Needs Review   atorvastatin (LIPITOR) 20 MG tablet Take 20 mg by mouth daily.  Historical Provider, MD Needs Review   citalopram (CELEXA) 20 MG tablet Take 20 mg by mouth daily.  Historical Provider, MD Needs Review   colchicine 0.6 MG tablet Take 1-2 tablets (0.6-1.2 mg total) by mouth as needed. Take two pills once, then take another an hour later. Take one daily until resolved [name], DO Needs Review   furosemide (LASIX) 40 MG tablet Take 40 mg by mouth daily. Historical Provider, MD Needs Review   gabapentin (NEURONTIN) 300 MG capsule Take 300 mg by mouth daily. Historical Provider, MD Needs Review   hydrochlorothiazide (HYDRODIURIL) 25 MG tablet Take 25 mg by mouth daily.  Historical Provider, MD Needs Review   lisinopril (PRINIVIL,ZESTRIL) 40 MG tablet Take 40 mg by mouth daily. Historical Provider, MD Needs Review   metFORMIN (GLUCOPHAGE) 1000 MG tablet Take 1,000 mg by mouth 2 (two) times daily with a meal. Historical Provider, MD Needs Review   metoprolol (LOPRESSOR) 100 MG tablet Take 100 mg by mouth 2 (two) times daily.  Historical Provider, MD Needs Review   potassium chloride SA (K-DUR,KLOR-CON) 20 MEQ tablet Take 20 mEq by mouth daily. Historical Provider, MD Needs Review   simvastatin (ZOCOR) 40 MG tablet Take 20 mg by mouth at bedtime. Historical Provider, MD Needs Review   traMADol (ULTRAM) 50 MG tablet Take 50-100 mg by mouth every 8 (eight) hours as needed. For pain Historical Provider, MD Needs Review   traZODone (DESYREL) 100 MG tablet Take 100 mg by mouth at bedtime.  Historical Provider, MD Needs Review   triamcinolone (KENALOG) 0.1 % cream Apply 1 application topically 2 (two) times daily.  Historical Provider, MD Needs Review   zolpidem (AMBIEN) 10 MG tablet Take 10 mg by mouth at bedtime as needed. For sleep Historical Provider, MD Needs Review   cefTRIAXone (ROCEPHIN) injection 1 g  Jonita Albee, MD Needs Review   naproxen (NAPROSYN) 500 MG tablet Take 1 tablet (500 mg  total) by mouth 2 (two) times daily with a meal. Ethelda Chick, MD Needs Review   nitroGLYCERIN (NITROSTAT) 0.4 MG        Results for orders placed during the hospital encounter of 03/18/13 (from the past 48 hour(s))  GLUCOSE, CAPILLARY     Status: Abnormal   Collection Time    03/18/13  4:18 PM      Result Value Range   Glucose-Capillary 134 (*) 70 -  99 mg/dL  URINALYSIS, ROUTINE W REFLEX MICROSCOPIC     Status: Abnormal   Collection Time    03/18/13  4:47 PM      Result Value Range   Color, Urine AMBER (*) YELLOW   Comment: BIOCHEMICALS MAY BE AFFECTED BY COLOR   APPearance CLOUDY (*) CLEAR   Specific Gravity, Urine 1.022  1.005 - 1.030   pH 5.0  5.0 - 8.0   Glucose, UA NEGATIVE  NEGATIVE mg/dL   Hgb urine dipstick NEGATIVE  NEGATIVE   Bilirubin Urine NEGATIVE  NEGATIVE   Ketones, ur NEGATIVE  NEGATIVE mg/dL   Protein, ur NEGATIVE  NEGATIVE mg/dL   Urobilinogen, UA 0.2  0.0 - 1.0 mg/dL   Nitrite NEGATIVE  NEGATIVE   Leukocytes, UA SMALL (*) NEGATIVE  URINE MICROSCOPIC-ADD ON     Status: None   Collection Time    03/18/13  4:47 PM      Result Value Range   Squamous Epithelial / LPF RARE  RARE   WBC, UA 0-2  <3 WBC/hpf   Urine-Other AMORPHOUS URATES/PHOSPHATES    CBC     Status: Abnormal   Collection Time    03/18/13  4:50 PM      Result Value Range   WBC 14.3 (*) 4.0 - 10.5 K/uL   RBC 3.89  3.87 - 5.11 MIL/uL   Hemoglobin 10.8 (*) 12.0 - 15.0 g/dL   HCT 16.1 (*) 09.6 - 04.5 %   MCV 83.8  78.0 - 100.0 fL   MCH 27.8  26.0 - 34.0 pg   MCHC 33.1  30.0 - 36.0 g/dL   RDW 40.9  81.1 - 91.4 %   Platelets 369  150 - 400 K/uL  COMPREHENSIVE METABOLIC PANEL     Status: Abnormal   Collection Time    03/18/13  4:50 PM      Result Value Range   Sodium 134 (*) 135 - 145 mEq/L   Potassium 3.3 (*) 3.5 - 5.1 mEq/L   Chloride 92 (*) 96 - 112 mEq/L   CO2 30  19 - 32 mEq/L   Glucose, Bld 153 (*) 70 - 99 mg/dL   BUN 33 (*) 6 - 23 mg/dL   Creatinine, Ser 7.82 (*) 0.50 - 1.10 mg/dL    Calcium 9.7  8.4 - 95.6 mg/dL   Total Protein 9.0 (*) 6.0 - 8.3 g/dL   Albumin 3.5  3.5 - 5.2 g/dL   AST 13  0 - 37 U/L   ALT 7  0 - 35 U/L   Alkaline Phosphatase 123 (*) 39 - 117 U/L   Total Bilirubin 1.5 (*) 0.3 - 1.2 mg/dL   GFR calc non Af Amer 41 (*) >90 mL/min   GFR calc Af Amer 47 (*) >90 mL/min   Comment: (NOTE)     The eGFR has been calculated using the CKD EPI equation.     This calculation has not been validated in all clinical situations.     eGFR's persistently <90 mL/min signify possible Chronic Kidney     Disease.  TROPONIN I     Status: None   Collection Time    03/18/13  4:50 PM      Result Value Range   Troponin I <0.30  <0.30 ng/mL   Comment:            Due to the release kinetics of cTnI,     a negative result within the first hours     of  the onset of symptoms does not rule out     myocardial infarction with certainty.     If myocardial infarction is still suspected,     repeat the test at appropriate intervals.  LIPASE, BLOOD     Status: None   Collection Time    03/18/13  4:50 PM      Result Value Range   Lipase 14  11 - 59 U/L  CULTURE, BLOOD (ROUTINE X 2)     Status: None   Collection Time    03/18/13  6:15 PM      Result Value Range   Specimen Description BLOOD RIGHT ANTECUBITAL     Special Requests BOTTLES DRAWN AEROBIC AND ANAEROBIC 5CC     Culture  Setup Time       Value: 03/18/2013 23:06     Performed at [facility]   Culture       Value:        BLOOD CULTURE RECEIVED NO GROWTH TO DATE CULTURE WILL BE HELD FOR 5 DAYS BEFORE ISSUING A FINAL NEGATIVE REPORT     Performed at [facility]   Report Status PENDING    URIC ACID     Status: Abnormal   Collection Time    03/19/13 12:06 AM      Result Value Range   Uric Acid, Serum 14.5 (*) 2.4 - 7.0 mg/dL  CBC     Status: Abnormal   Collection Time    03/19/13  4:10 AM      Result Value Range   WBC 9.7  4.0 - 10.5 K/uL   RBC 3.35 (*) 3.87 - 5.11 MIL/uL   Hemoglobin 9.2  (*) 12.0 - 15.0 g/dL   HCT 78.2 (*) 95.6 - 21.3 %   MCV 84.5  78.0 - 100.0 fL   MCH 27.5  26.0 - 34.0 pg   MCHC 32.5  30.0 - 36.0 g/dL   RDW 08.6  57.8 - 46.9 %   Platelets 327  150 - 400 K/uL  BASIC METABOLIC PANEL     Status: Abnormal   Collection Time    03/19/13  4:10 AM      Result Value Range   Sodium 134 (*) 135 - 145 mEq/L   Potassium 2.9 (*) 3.5 - 5.1 mEq/L   Chloride 95 (*) 96 - 112 mEq/L   CO2 29  19 - 32 mEq/L   Glucose, Bld 150 (*) 70 - 99 mg/dL   BUN 25 (*) 6 - 23 mg/dL   Creatinine, Ser 6.29 (*) 0.50 - 1.10 mg/dL   Calcium 9.0  8.4 - 52.8 mg/dL   GFR calc non Af Amer 46 (*) >90 mL/min   GFR calc Af Amer 53 (*) >90 mL/min   Comment: (NOTE)     The eGFR has been calculated using the CKD EPI equation.     This calculation has not been validated in all clinical situations.     eGFR's persistently <90 mL/min signify possible Chronic Kidney     Disease.  GLUCOSE, CAPILLARY     Status: Abnormal   Collection Time    03/19/13  8:10 AM      Result Value Range   Glucose-Capillary 123 (*) 70 - 99 mg/dL  GLUCOSE, CAPILLARY     Status: Abnormal   Collection Time    03/19/13 11:23 AM      Result Value Range   Glucose-Capillary 182 (*) 70 - 99 mg/dL    Dg Chest  2 View  03/18/2013   CLINICAL DATA:  Weakness. History of diabetes and mitral regurgitation.  EXAM: CHEST  2 VIEW  COMPARISON:  12/27/2011.  FINDINGS: There is suboptimal inspiration on the lateral view. The heart size and mediastinal contours are stable with aortic atherosclerosis. The lungs appear clear. There is no pleural effusion or pneumothorax. No acute osseous findings are seen. Telemetry leads overlie the chest.  IMPRESSION: No active cardiopulmonary process.   Electronically Signed   By: Roxy Horseman   On: 03/18/2013 18:57   Ct Abdomen Pelvis W Contrast  03/18/2013   *RADIOLOGY REPORT*  Clinical Data: Abdominal pain, recent urinary tract infection, generalized weakness  CT ABDOMEN AND PELVIS WITH CONTRAST   Technique:  Multidetector CT imaging of the abdomen and pelvis was performed following the standard protocol during bolus administration of intravenous contrast.  Contrast: 80mL OMNIPAQUE IOHEXOL 300 MG/ML  SOLN, 50mL OMNIPAQUE IOHEXOL 300 MG/ML  SOLN  Comparison: None.  Findings:  Normal hepatic contour.  There is a sub centimeter hypoattenuating lesion in the dome of the right lobe the liver (image 8, series 2) which is too small to accurately characterize.  Post cholecystectomy.  There is dilatation of the common bile duct measuring approximately 1.4 cm in greatest oblique coronal dimension (coronal image 41, series 3).  There is minimal centralized intrahepatic biliary duct dilatation, presumably sequelae of postcholecystectomy state.  The pancreas is atrophic but otherwise normal.  No peripancreatic stranding.  No ascites.  There is symmetric enhancement and excretion of the bilateral kidneys.  Punctate calcifications about the bilateral splenic hila are favored to be vascular in etiology.  No definite renal stones. No urinary obstruction or perinephric stranding. Note is made of a small (approximately 1.8 x 1.5 cm diverticulum arising from the left lateral aspect of the dome of the urinary bladder (image 72, series 2).  Normal appearance of the bilateral adrenal glands. Normal appearance of the spleen.  Incidental note is made of a small splenule.  A minimal amount of enteric contrast has been ingested is seen extending to the level of the distal small bowel.  Moderate colonic stool burden without evidence of obstruction.  Colonic diverticulosis without evidence of diverticulitis.  No pneumoperitoneum, pneumatosis or portal venous gas.  Scattered atherosclerotic plaque within a normal caliber abdominal aorta.  The major branch vessels of the abdominal aorta appear patent on this non CTA examination.  No definite retroperitoneal, mesenteric, pelvic or inguinal lymphadenopathy.  Post hysterectomy.  No discrete  adnexal lesion.  No free fluid within the pelvis.  Limited visualization of the lower thorax demonstrates minimal bibasilar dependent ground-glass atelectasis.  There is a small (approximately 1.6 cm cyst within the imaged right lower lobe as well as a smaller sub centimeter cyst within the right middle lobe. No focal airspace opacities.  No pleural effusion.  Normal heart size.  No pericardial effusion.  No acute or aggressive osseous abnormalities.  Mild to moderate multilevel lumbar spine DDD.  IMPRESSION: 1.  No acute findings within the abdomen or pelvis.  Specifically, no evidence of enteric or urinary obstruction. 2.  Moderate colonic stool burden.  Colonic diverticulosis without evidence of diverticulitis. 3.  Mild intra and extrahepatic biliary ductal dilatation, presumably the sequela of postcholecystectomy state.   Original Report Authenticated By: Tacey Ruiz, MD    ROS Blood pressure 128/69, pulse 65, temperature 98 F (36.7 C), temperature source Oral, resp. rate 16, height 5' 2.5" (1.588 m), weight 81.647 kg (180 lb), SpO2 100.00%.  Physical Exam  Constitutional: She appears well-developed and well-nourished. She appears distressed (mild distress during the exam due to pain).  HENT:  Head: Normocephalic and atraumatic.  Eyes: Pupils are equal, round, and reactive to light.  Musculoskeletal:       Right knee: She exhibits decreased range of motion, swelling and effusion. Tenderness (tenderness to palpation) found.       Lumbar back: She exhibits tenderness (Tenderness noted in over the low back with moderate pain in the right distal paralumbar muscles.), bony tenderness (Tenderness noted over the mid lumbar spinous processes) and pain (pain noted on lateral rolling in bed.  (exam of lumbar limited due to pain and being in the bed)).       Back:       Legs: Slight weakness with right foot dorsiflexion on exam (could be due to pain)   Neurological: She is alert. A sensory deficit  (slight decrease sensation to touch over the dorsum of the right foot, otherwise sensation appears equal to both legs.) is present.    Assessment/Plan: Right-sided Back Pain with Radicular Component Right Knee Pain with Effusion  Patient seen and examined.  Concern for some lower back issue causing radicular pain into the right leg.  Will setup for an MRI of the lumbar spine to rule out radicular pathology. Concern that the knee may be a gouty flare.  Will check the uric acid level and pain films of the right knee.  [name] 03/19/2013, 3:43 PM   I have seen and examined the patient and agree with the assessment and have formulated the plan. I feel that most of her pain is lumbar mediated. I also feel that she has a gout flare in her knee. Will order above tests and make game plan based on results.

## 2013-03-19 NOTE — ED Notes (Signed)
MADE AWARE THIS RN WOULD MEDICATE WITH 0.5 DILAUDID  IVP BEFORE TRANSFER AND WOULD REEVALUATE BEFORE TRANSFER

## 2013-03-19 NOTE — Care Management Note (Addendum)
    Page 1 of 2   03/23/2013     10:19:31 AM   CARE MANAGEMENT NOTE 03/23/2013  Patient:  [name], [name]   Account Number:  [id number]  Date Initiated:  03/19/2013  Documentation initiated by:  Colleen Can  Subjective/Objective Assessment:   dx trochanter burisits-, leukocytosis    Pt is requesting [facility] if [facility] services are ordered.     Action/Plan:   CM spoke with patient. Plans are for her to return to her home in [place]. Grand daughter lives with her and wil offer support. Pt does not have RW or commode seat. States she [place] currently in pain and has not been able to walk d/t pain.   Anticipated DC Date:  03/23/2013   Anticipated DC Plan:  HOME W HOME HEALTH SERVICES      DC Planning Services  CM consult      Choice offered to / List presented to:  C-1 Patient   DME arranged  3-N-1  Levan Hurst      DME agency  [facility]     HH arranged  HH-2 PT  HH-3 OT  HH-4 NURSE'S AIDE      HH agency  [facility]   Status of service:  Completed, signed off Medicare Important Message given?   (If response is "NO", the following Medicare IM given date fields will be blank) Date Medicare IM given:   Date Additional Medicare IM given:    Discharge Disposition:  HOME W HOME HEALTH SERVICES  Per UR Regulation:  Reviewed for med. necessity/level of care/duration of stay  If discussed at Long Length of Stay Meetings, dates discussed:    Comments:  03/22/13 Bryleigh Ottaway RN,BSN NCM [phone] [facility] KRISTEN AWARE OF D/C TODAY HHPT/OT/NURSE'S AIDE/RW,3N1.  03/21/13 Deondra Wigger RN,BSN NCM [phone] TRANSFER FROM 6TH FLOOR FOR TELE.FOR EPIDURAL INJECTION @ [facility] KRISTEN FOLLOWING,ALREADY HAS HHPT,RW,3N1 ORDERED-DME HAS ALREADY BEEN DELIVERED.

## 2013-03-20 ENCOUNTER — Observation Stay (HOSPITAL_COMMUNITY): Payer: Medicare Other

## 2013-03-20 DIAGNOSIS — M109 Gout, unspecified: Secondary | ICD-10-CM | POA: Diagnosis present

## 2013-03-20 DIAGNOSIS — I5030 Unspecified diastolic (congestive) heart failure: Secondary | ICD-10-CM | POA: Diagnosis present

## 2013-03-20 DIAGNOSIS — D638 Anemia in other chronic diseases classified elsewhere: Secondary | ICD-10-CM

## 2013-03-20 DIAGNOSIS — N189 Chronic kidney disease, unspecified: Secondary | ICD-10-CM | POA: Diagnosis present

## 2013-03-20 DIAGNOSIS — M48061 Spinal stenosis, lumbar region without neurogenic claudication: Secondary | ICD-10-CM | POA: Diagnosis present

## 2013-03-20 DIAGNOSIS — N179 Acute kidney failure, unspecified: Secondary | ICD-10-CM

## 2013-03-20 LAB — GLUCOSE, CAPILLARY
Glucose-Capillary: 127 mg/dL — ABNORMAL HIGH (ref 70–99)
Glucose-Capillary: 112 mg/dL — ABNORMAL HIGH (ref 70–99)
Glucose-Capillary: 100 mg/dL — ABNORMAL HIGH (ref 70–99)

## 2013-03-20 LAB — HEMOGLOBIN A1C
Hgb A1c MFr Bld: 6.5 % — ABNORMAL HIGH (ref <5.7)
Mean Plasma Glucose: 140 mg/dL — ABNORMAL HIGH (ref <117)

## 2013-03-20 MED ORDER — HYDROCODONE-ACETAMINOPHEN 5-325 MG PO TABS
1.0000 | ORAL_TABLET | ORAL | Status: DC | PRN
  Administered 2013-03-20: 1 via ORAL
  Administered 2013-03-20: 2 via ORAL
  Filled 2013-03-20: qty 1

## 2013-03-20 MED ORDER — OXYCODONE-ACETAMINOPHEN 5-325 MG PO TABS
1.0000 | ORAL_TABLET | ORAL | Status: DC | PRN
  Administered 2013-03-20 – 2013-03-22 (×8): 2 via ORAL
  Filled 2013-03-20 (×8): qty 2

## 2013-03-20 MED ORDER — OXYCODONE-ACETAMINOPHEN 5-325 MG PO TABS: 1.0000 | ORAL_TABLET | ORAL | Status: DC | PRN

## 2013-03-20 MED ORDER — HYDROMORPHONE HCL PF 1 MG/ML IJ SOLN: 1.0000 mg | INTRAMUSCULAR | Status: DC | PRN

## 2013-03-20 MED ORDER — ZOLPIDEM TARTRATE 5 MG PO TABS: 5.0000 mg | ORAL_TABLET | Freq: Every evening | ORAL | Status: DC | PRN

## 2013-03-20 MED ORDER — TRAZODONE HCL 100 MG PO TABS
100.0000 mg | ORAL_TABLET | Freq: Every day | ORAL | Status: DC
  Administered 2013-03-20 – 2013-03-21 (×2): 100 mg via ORAL
  Filled 2013-03-20 (×3): qty 1

## 2013-03-20 MED ORDER — INSULIN ASPART 100 UNIT/ML ~~LOC~~ SOLN
0.0000 [IU] | Freq: Three times a day (TID) | SUBCUTANEOUS | Status: DC
  Administered 2013-03-21 – 2013-03-22 (×3): 1 [IU] via SUBCUTANEOUS

## 2013-03-20 MED ORDER — ATORVASTATIN CALCIUM 20 MG PO TABS: 20.0000 mg | ORAL_TABLET | Freq: Every day | ORAL | Status: DC

## 2013-03-20 MED ORDER — METOPROLOL TARTRATE 100 MG PO TABS
100.0000 mg | ORAL_TABLET | Freq: Two times a day (BID) | ORAL | Status: DC
  Administered 2013-03-20: 100 mg via ORAL
  Filled 2013-03-20 (×3): qty 1

## 2013-03-20 MED ORDER — SIMVASTATIN 20 MG PO TABS
20.0000 mg | ORAL_TABLET | Freq: Every day | ORAL | Status: DC
  Administered 2013-03-20 – 2013-03-21 (×2): 20 mg via ORAL
  Filled 2013-03-20 (×3): qty 1

## 2013-03-20 MED ORDER — CITALOPRAM HYDROBROMIDE 20 MG PO TABS
20.0000 mg | ORAL_TABLET | Freq: Every day | ORAL | Status: DC
  Administered 2013-03-20 – 2013-03-22 (×3): 20 mg via ORAL
  Filled 2013-03-20 (×3): qty 1

## 2013-03-20 MED ORDER — GABAPENTIN 300 MG PO CAPS
300.0000 mg | ORAL_CAPSULE | Freq: Every day | ORAL | Status: DC
  Administered 2013-03-21 – 2013-03-22 (×2): 300 mg via ORAL
  Filled 2013-03-20 (×2): qty 1

## 2013-03-20 MED ORDER — POTASSIUM CHLORIDE CRYS ER 20 MEQ PO TBCR
40.0000 meq | EXTENDED_RELEASE_TABLET | Freq: Two times a day (BID) | ORAL | Status: AC
  Administered 2013-03-20 (×2): 40 meq via ORAL
  Filled 2013-03-20 (×2): qty 2

## 2013-03-20 MED ORDER — COLCHICINE 0.6 MG PO TABS
0.6000 mg | ORAL_TABLET | Freq: Two times a day (BID) | ORAL | Status: DC
  Administered 2013-03-20 – 2013-03-22 (×4): 0.6 mg via ORAL
  Filled 2013-03-20 (×5): qty 1

## 2013-03-20 MED ORDER — MORPHINE SULFATE 2 MG/ML IJ SOLN: 2.0000 mg | Freq: Once | INTRAMUSCULAR | Status: DC

## 2013-03-20 MED ORDER — ASPIRIN 325 MG PO TABS
325.0000 mg | ORAL_TABLET | Freq: Every day | ORAL | Status: DC
  Administered 2013-03-20 – 2013-03-22 (×2): 325 mg via ORAL
  Filled 2013-03-20 (×4): qty 1

## 2013-03-20 MED ORDER — LORAZEPAM 2 MG/ML IJ SOLN
1.0000 mg | Freq: Once | INTRAMUSCULAR | Status: AC
  Administered 2013-03-20: 1 mg via INTRAVENOUS
  Filled 2013-03-20: qty 1

## 2013-03-20 MED ORDER — LISINOPRIL 40 MG PO TABS
40.0000 mg | ORAL_TABLET | Freq: Every day | ORAL | Status: DC
  Filled 2013-03-20: qty 1

## 2013-03-20 NOTE — Progress Notes (Signed)
Subjective: Patient reports pain as mild and moderate.  A little better today as compared to yesterday. Patient seen in rounds for Dr. Lequita Halt. Patient is having problems with pain in the her back, requiring pain medications Plan for Lumbar MRI later today.   Her right knee is feeling a little better.  Uric Acid levels noted to be elevated.  Knee is likely gout flare.  If continues to improve, just monitor.  If not, may require aspiration and injection.   Follow up on MRI results.  Objective: Vital signs in last 24 hours: Temp:  [98 F (36.7 C)-98.3 F (36.8 C)] 98.3 F (36.8 C) (08/26 0441) Pulse Rate:  [65-72] 72 (08/26 0441) Resp:  [16] 16 (08/26 0441) BP: (121-128)/(69-77) 121/73 mmHg (08/26 0441) SpO2:  [97 %-100 %] 97 % (08/26 0441)  Intake/Output from previous day: 08/25 0701 - 08/26 0700 In: 1140 [P.O.:1140] Out: 1075 [Urine:1075]   Recent Labs  03/18/13 1650 03/19/13 0410  HGB 10.8* 9.2*    Recent Labs  03/18/13 1650 03/19/13 0410  WBC 14.3* 9.7  RBC 3.89 3.35*  HCT 32.6* 28.3*  PLT 369 327    Recent Labs  03/18/13 1650 03/19/13 0410  NA 134* 134*  K 3.3* 2.9*  CL 92* 95*  CO2 30 29  BUN 33* 25*  CREATININE 1.30* 1.18*  GLUCOSE 153* 150*  CALCIUM 9.7 9.0   No results found for this basename: LABPT, INR,  in the last 72 hours  EXAM General - Patient is Alert and Appropriate Extremity - Neurovascular intact Sensation intact distally Dorsiflexion/Plantar flexion intact, slight weakness with right foot dorsiflexion on exam Motor Function - intact, moving foot and toes well on exam.   Past Medical History  Diagnosis Date  . Atypical face pain   . Hypertension   . CAD (coronary artery disease)     Catheterization 2006, mild two-vessel nonobstructive disease  . GERD (gastroesophageal reflux disease)   . Headache(784.0)   . Hyperlipidemia   . Arthritis   . Sleep apnea     CPAP compliance ???  . Diabetes mellitus   . Shortness of breath    . PAD (peripheral artery disease)     Dr. Darrick Penna,  Bilateral superficial femoral artery occlusions with one vessel runoff via the peroneal artery  . Ejection fraction     EF 60%, echo, October, 2010,  . Urinary incontinence   . Overweight(278.02)   . Mitral regurgitation     Mild, echo, October, 2010  . Fluid overload   . Foot pain, right     November, 2012  . Stroke   . UTI (lower urinary tract infection)     Assessment/Plan:     Principal Problem:   Trochanteric bursitis Active Problems:   DM  Estimated body mass index is 32.38 kg/(m^2) as calculated from the following:   Height as of this encounter: 5' 2.5" (1.588 m).   Weight as of this encounter: 81.647 kg (180 lb).   Weight-Bearing as tolerated to right leg if able to tolerate ambulation. Follow up on MRI.  PERKINS, ALEXZANDREW 03/20/2013, 8:22 AM

## 2013-03-20 NOTE — Evaluation (Signed)
Physical Therapy Evaluation Patient Details Name: [name] MRN: [id number] DOB: [birth year] Today's Date: 03/20/2013 Time: 1200-1231 PT Time Calculation (min): 31 min  PT Assessment / Plan / Recommendation History of Present Illness  [name] is a 67 y.o. female who presents with 7 day history of worsening back, hip and R leg pain.  Pain is very severe to the point where she cannot ambulate for the past couple of days.  Pain is worse with palpation over the greater trochanter of her R femur. MRI lumbar pending.  Clinical Impression  Pt. Reports pain radiating from R back, down buttock(tender to palpate a R  Sciatic notch) to calf. Pt also relates that L first met head tender and R knee. Pt moves very slowly, guarding back.Pt amb. X 15' with rw x 2. Instructed pt in back precautions Pt states she was working until last week. Pt reports she will be home alone as granddaughter works. Pt will benefit from PT in acute care and recommend HHPT, RW, 3-in-1 and OT consult.pt will benefit from PT while in acute care.    PT Assessment  Patient needs continued PT services    Follow Up Recommendations  Home health PT    Does the patient have the potential to tolerate intense rehabilitation      Barriers to Discharge Decreased caregiver support      Equipment Recommendations  Rolling walker with 5" wheels;3in1 (PT)    Recommendations for Other Services OT consult   Frequency Min 5X/week    Precautions / Restrictions Precautions Precautions: Back;Fall   Pertinent Vitals/Pain Back pain down R leg 5 to 7/10 with mobility.      Mobility  Bed Mobility Bed Mobility: Rolling Right;Right Sidelying to Sit Rolling Right: 4: Min assist;With rail Right Sidelying to Sit: 4: Min assist;HOB flat Details for Bed Mobility Assistance: cues for log rolling, back precautions. Transfers Transfers: Sit to Stand;Stand to Sit Sit to Stand: From bed;From chair/3-in-1;From elevated surface;With upper  extremity assist;4: Min assist Stand to Sit: To chair/3-in-1;With upper extremity assist;4: Min assist Details for Transfer Assistance: extra time required for activity. Pt guarding back and RLE. Ambulation/Gait Ambulation/Gait Assistance: 4: Min assist Ambulation Distance (Feet): 15 Feet (x2) Assistive device: Rolling walker Ambulation/Gait Assistance Details: extra time required for activity, cues for posture as pt tends to flex forward. Gait Pattern: Step-through pattern;Decreased step length - right;Decreased step length - left;Trunk flexed Gait velocity: slow    Exercises     PT Diagnosis: Difficulty walking;Acute pain  PT Problem List: Decreased strength;Decreased activity tolerance;Decreased mobility;Decreased knowledge of use of DME;Decreased safety awareness;Decreased knowledge of precautions;Pain PT Treatment Interventions: DME instruction;Gait training;Functional mobility training;Therapeutic activities;Patient/family education     PT Goals(Current goals can be found in the care plan section) Acute Rehab PT Goals Patient Stated Goal: I want to walk and not have pain. PT Goal Formulation: With patient/family Time For Goal Achievement: 03/27/13 Potential to Achieve Goals: Good  Visit Information  Last PT Received On: 03/20/13 Assistance Needed: +2 History of Present Illness: [name] is a 67 y.o. female who presents with 7 day history of worsening back, hip and R leg pain.  Pain is very severe to the point where she cannot ambulate for the past couple of days.  Pain is worse with palpation over the greater trochanter of her R femur. MRI lumbar pending.       Prior Functioning  Home Living Family/patient expects to be discharged to:: Private residence Living Arrangements: Other  relatives Available Help at Discharge: Family Type of Home: Apartment Home Layout: Two level;Able to live on main level with bedroom/bathroom Home Equipment: None Additional Comments: pt  was working until onset of back pain. Prior Function Level of Independence: Independent Communication Communication: No difficulties    Cognition  Cognition Arousal/Alertness: Awake/alert Behavior During Therapy: WFL for tasks assessed/performed Overall Cognitive Status: Within Functional Limits for tasks assessed    Extremity/Trunk Assessment Upper Extremity Assessment Upper Extremity Assessment: Overall WFL for tasks assessed Lower Extremity Assessment Lower Extremity Assessment: RLE deficits/detail;LLE deficits/detail RLE Deficits / Details: decreased tolerance to knee flexion. LLE Deficits / Details: first met head painful  Cervical / Trunk Assessment Cervical / Trunk Assessment:  (flexed posture.)   Balance    End of Session PT - End of Session Activity Tolerance: Patient limited by pain Patient left: in chair;with call bell/phone within reach;with family/visitor present Nurse Communication: Mobility status;Precautions  GP Functional Assessment Tool Used: clinical judgement. Functional Limitation: Mobility: Walking and moving around Mobility: Walking and Moving Around Current Status [phone]): At least 20 percent but less than 40 percent impaired, limited or restricted Mobility: Walking and Moving Around Goal Status [phone]): 0 percent impaired, limited or restricted   Rada Hay 03/20/2013, 1:04 PM Blanchard Kelch PT [phone]

## 2013-03-20 NOTE — Progress Notes (Signed)
[facility]  [facility] is providing the following services: rw and commode  If patient discharges after hours, please call [phone].   Renard Hamper 03/20/2013, 1:58 PM

## 2013-03-20 NOTE — Progress Notes (Signed)
Patient ID: [name], female   DOB: [birth year], 67 y.o.   MRN: [id number] [facility] PROGRESS NOTE  NAYRA COURY WUJ:[id number] DOB: Oct 24, 1945 DOA: 03/18/2013 PCP: Warrick Parisian, MD  Brief narrative: 67 y.o. female with history of HTN, diabetes, chronic kidney disease stage I, presented to [facility] ED with main concern of progressively worsening, constant and throbbing lower back pain and radiating to the right lower extremity, 10/10 in severity, worse with ambulation and with no specific alleviating factors.   In the ED, WBC 14.3k and temperature 99.4 but work up including CT abd/pelvis was negative for source of infection, persistent pain and poorly controlled on oral analgesia. [facility] asked to admit and ortho team was consulted for further assistance.   Principal Problem:   Spinal stenosis of lumbar region - also evident on lumbar spine MRI, L5-S1 area - spoke with ortho and recommendation is to proceed with epidural spinal injection by interventional radiology - will place consult for IR - continue analgesia in the meantime - PT evaluation done and recommendation is to continue home health PT upon discharge, order placed  Active Problems:   Acute on chronic kidney failure - pt is on several different medications that could contribute: Lasix, Lisinopril, Metformin, Colchicine, NSAID's (Naprosyn)  - will hold NSAID's, hold metformin for now and Lasix, creatinine is trending down  - I will only continue Lisinopril with very close renal function mnitoring   Gout, small joint effusion in the right knee - clinically improving and less tender to palpation  - elevated uric acid > 14 on admission but now trending down (12 this AM) - pt is not on allopurinol, consider upon discharge  - continue Colchicine but monitor renal function closely  - if no improvement, may need aspiration per ortho recommendations    HTN - will continue only metoprolol and Lisinopril for now - hold HCTZ,  Lasix until renal function improves  - BP currently well controlled    DM, type II - hold metformin for now due to renal failure, will check A1C - place on SSI temporarily and readjust the regimen as indicated    Diastolic CHF - clinically euvolemic, due to renal failure will hold Lasix for now - reassess clinical status in AM and decide if Lasix can be continued  - monitor daily weights, strict I's and O's   Anemia of chronic disease - drop in Hg overnight, no signs of active bleed - repeat CBC in AM   HYPOKALEMIA - will supplement today, given K-Dur 40 MEQ x 2 doses - repeat BMP in AM - check Mg level as well, place on telemetry over 24 hours    GERD (gastroesophageal reflux disease) - stable  Consultants:  Ortho  Interventional radiology   Procedures/Studies: Dg Chest 2 View  03/18/2013    1.  No active cardiopulmonary process.    Dg Knee 1-2 Views Right   03/19/2013   1.  No acute bony abnormality.  Small joint effusion.    Mr Lumbar Spine Wo Contrast  03/20/2013    1.  Mild lateral recess and foraminal narrowing bilaterally at L4-5 secondary to a broad-based disc herniation and facet hypertrophy.  2.  The most severe disease is at L5-S1 with moderate to severe lateral recess narrowing bilaterally, worse on the right.  3.  A broad-based disc herniation, facet hypertrophy, and a right- sided synovial cyst contribute to the disease at L5-S1.  4.  Moderate to severe right and moderate left foraminal stenosis at  L5-S1.   Ct Abdomen Pelvis W Contrast  03/18/2013    1.  No acute findings within the abdomen or pelvis.  Specifically, no evidence of enteric or urinary obstruction.  2.  Moderate colonic stool burden.  Colonic diverticulosis without evidence of diverticulitis.  3.  Mild intra and extrahepatic biliary ductal dilatation, presumably the sequela of postcholecystectomy state.    Antibiotics:  None  Code Status: Full Family Communication: Pt at bedside, daughter over  the phone  Disposition Plan: Home when medically stable, home health PT ordered   HPI/Subjective: No events overnight.   Objective: Filed Vitals:   03/19/13 0300 03/19/13 1450 03/19/13 2134 03/20/13 0441  BP: 124/78 128/69 126/77 121/73  Pulse: 80 65 70 72  Temp: 98.4 F (36.9 C) 98 F (36.7 C) 98.3 F (36.8 C) 98.3 F (36.8 C)  TempSrc: Oral Oral Oral Oral  Resp:  16 16 16   Height:      Weight:      SpO2: 93% 100% 99% 97%    Intake/Output Summary (Last 24 hours) at 03/20/13 1408 Last data filed at 03/20/13 0900  Gross per 24 hour  Intake    900 ml  Output    775 ml  Net    125 ml    Exam:   General:  Pt is alert, follows commands appropriately, not in acute distress  Cardiovascular: Regular rate and rhythm, S1/S2, no murmurs, no rubs, no gallops  Respiratory: Clear to auscultation bilaterally, no wheezing, no crackles, no rhonchi  Abdomen: Soft, non tender, non distended, bowel sounds present, no guarding  Extremities: Trace bilateral pitting LE edema, pulses DP and PT palpable bilaterally, right knee slightly tender to palpation, no erythema and no warmth to touch, lumbar area tender to palpation    Neuro: Grossly nonfocal  Data Reviewed: Basic Metabolic Panel:  Recent Labs Lab 03/18/13 1650 03/19/13 0410  NA 134* 134*  K 3.3* 2.9*  CL 92* 95*  CO2 30 29  GLUCOSE 153* 150*  BUN 33* 25*  CREATININE 1.30* 1.18*  CALCIUM 9.7 9.0   Liver Function Tests:  Recent Labs Lab 03/18/13 1650  AST 13  ALT 7  ALKPHOS 123*  BILITOT 1.5*  PROT 9.0*  ALBUMIN 3.5    Recent Labs Lab 03/18/13 1650  LIPASE 14   CBC:  Recent Labs Lab 03/18/13 1650 03/19/13 0410  WBC 14.3* 9.7  HGB 10.8* 9.2*  HCT 32.6* 28.3*  MCV 83.8 84.5  PLT 369 327   Cardiac Enzymes:  Recent Labs Lab 03/18/13 1650  TROPONINI <0.30   CBG:  Recent Labs Lab 03/19/13 0810 03/19/13 1123 03/19/13 1628 03/19/13 2131 03/20/13 0716  GLUCAP 123* 182* 142* 127* 127*     Recent Results (from the past 240 hour(s))  CULTURE, BLOOD (ROUTINE X 2)     Status: None   Collection Time    03/18/13  6:15 PM      Result Value Range Status   Specimen Description BLOOD RIGHT ANTECUBITAL   Final   Special Requests BOTTLES DRAWN AEROBIC AND ANAEROBIC 5CC   Final   Culture  Setup Time     Final   Value: 03/18/2013 23:06     Performed at [facility]   Culture     Final   Value:        BLOOD CULTURE RECEIVED NO GROWTH TO DATE CULTURE WILL BE HELD FOR 5 DAYS BEFORE ISSUING A FINAL NEGATIVE REPORT     Performed at [facility]   Report Status PENDING   Incomplete  CULTURE, BLOOD (ROUTINE X 2)     Status: None   Collection Time    03/18/13  9:38 PM      Result Value Range Status   Specimen Description BLOOD LEFT ARM   Final   Special Requests BOTTLES DRAWN AEROBIC AND ANAEROBIC 10CC EACH   Final   Culture  Setup Time     Final   Value: 03/19/2013 01:21     Performed at [facility]   Culture     Final   Value:        BLOOD CULTURE RECEIVED NO GROWTH TO DATE CULTURE WILL BE HELD FOR 5 DAYS BEFORE ISSUING A FINAL NEGATIVE REPORT     Performed at [facility]   Report Status PENDING   Incomplete     Scheduled Meds: . heparin  5,000 Units Subcutaneous Q8H  . insulin aspart  0-15 Units Subcutaneous TID WC  .  morphine injection  2 mg Intravenous Once  . potassium chloride  40 mEq Oral BID   Continuous Infusions:  Debbora Presto, MD  [facility] Pager [phone]  If 7PM-7AM, please contact night-coverage [email or web address] Password [email or web address] 03/20/2013, 2:08 PM   LOS: 2 days

## 2013-03-20 NOTE — ED Provider Notes (Signed)
Medical screening examination/treatment/procedure(s) were performed by non-physician practitioner and as supervising physician I was immediately available for consultation/collaboration.   Lyanne Co, MD 03/20/13 [phone]

## 2013-03-21 ENCOUNTER — Ambulatory Visit (HOSPITAL_COMMUNITY)
Admit: 2013-03-21 | Discharge: 2013-03-21 | Disposition: A | Discharge status: Home / Self Care | Payer: Medicare Other | Attending: Internal Medicine | Admitting: Internal Medicine

## 2013-03-21 ENCOUNTER — Ambulatory Visit (HOSPITAL_COMMUNITY): Payer: Medicare Other

## 2013-03-21 DIAGNOSIS — M545 Low back pain: Secondary | ICD-10-CM

## 2013-03-21 DIAGNOSIS — M48061 Spinal stenosis, lumbar region without neurogenic claudication: Principal | ICD-10-CM

## 2013-03-21 DIAGNOSIS — M109 Gout, unspecified: Secondary | ICD-10-CM

## 2013-03-21 DIAGNOSIS — M4716 Other spondylosis with myelopathy, lumbar region: Secondary | ICD-10-CM

## 2013-03-21 DIAGNOSIS — I5030 Unspecified diastolic (congestive) heart failure: Secondary | ICD-10-CM

## 2013-03-21 DIAGNOSIS — I509 Heart failure, unspecified: Secondary | ICD-10-CM

## 2013-03-21 LAB — CBC
HCT: 29.3 % — ABNORMAL LOW (ref 36.0–46.0)
MCH: 27 pg (ref 26.0–34.0)
MCV: 84.9 fL (ref 78.0–100.0)
Platelets: 382 10*3/uL (ref 150–400)
RBC: 3.45 MIL/uL — ABNORMAL LOW (ref 3.87–5.11)
RDW: 13.3 % (ref 11.5–15.5)

## 2013-03-21 LAB — BASIC METABOLIC PANEL
BUN: 25 mg/dL — ABNORMAL HIGH (ref 6–23)
CO2: 29 mEq/L (ref 19–32)
Calcium: 9.8 mg/dL (ref 8.4–10.5)
Creatinine, Ser: 1.29 mg/dL — ABNORMAL HIGH (ref 0.50–1.10)

## 2013-03-21 LAB — GLUCOSE, CAPILLARY: Glucose-Capillary: 85 mg/dL (ref 70–99)

## 2013-03-21 MED ORDER — IOHEXOL 180 MG/ML  SOLN
20.0000 mL | Freq: Once | INTRAMUSCULAR | Status: AC | PRN
  Administered 2013-03-21: 5 mL via INTRAVENOUS

## 2013-03-21 MED ORDER — LISINOPRIL 20 MG PO TABS
20.0000 mg | ORAL_TABLET | Freq: Every day | ORAL | Status: DC
  Administered 2013-03-22: 20 mg via ORAL
  Filled 2013-03-21: qty 1

## 2013-03-21 MED ORDER — METOPROLOL TARTRATE 100 MG PO TABS
100.0000 mg | ORAL_TABLET | Freq: Two times a day (BID) | ORAL | Status: DC
  Administered 2013-03-21 – 2013-03-22 (×2): 100 mg via ORAL
  Filled 2013-03-21 (×3): qty 1

## 2013-03-21 MED ORDER — METOPROLOL TARTRATE 50 MG PO TABS
50.0000 mg | ORAL_TABLET | Freq: Once | ORAL | Status: AC
  Administered 2013-03-21: 50 mg via ORAL
  Filled 2013-03-21: qty 1

## 2013-03-21 MED ORDER — METHYLPREDNISOLONE ACETATE 80 MG/ML IJ SUSP
INTRAMUSCULAR | Status: AC
  Filled 2013-03-21: qty 1

## 2013-03-21 MED ORDER — FUROSEMIDE 40 MG PO TABS
40.0000 mg | ORAL_TABLET | Freq: Every day | ORAL | Status: DC
  Administered 2013-03-21 – 2013-03-22 (×2): 40 mg via ORAL
  Filled 2013-03-21 (×2): qty 1

## 2013-03-21 MED ORDER — METHYLPREDNISOLONE ACETATE 40 MG/ML IJ SUSP
INTRAMUSCULAR | Status: AC
  Filled 2013-03-21: qty 5

## 2013-03-21 NOTE — Progress Notes (Signed)
Patient ID: [name], female   DOB: 04/11/1946, 67 y.o.   MRN: [id number] [facility] PROGRESS NOTE  TANAIRY PAYEUR AOZ:[id number] DOB: [birth year] DOA: 03/18/2013 PCP: Warrick Parisian, MD  Brief narrative: 67 y.o. female with history of HTN, diabetes, chronic kidney disease stage I, presented to [facility] ED with main concern of progressively worsening, constant and throbbing lower back pain and radiating to the right lower extremity, 10/10 in severity, worse with ambulation and with no specific alleviating factors.   In the ED, WBC 14.3k and temperature 99.4 but work up including CT abd/pelvis was negative for source of infection, persistent pain and poorly controlled on oral analgesia. [facility] asked to admit and ortho team was consulted for further assistance.   Principal Problem:   Severe low back pain with Spinal stenosis L5S1 - for ESI by interventional radiology today - continue Narcotics PRN - PT evaluation done and recommendation is to continue home health PT upon discharge    Acute on chronic kidney failure - pt is on several different medications that could contribute: Lasix, Lisinopril, Metformin, Colchicine, NSAID's (Naprosyn)  - will hold NSAID's, hold metformin for now and Lasix, creatinine is trending down  - slowly improving, cut down lisinopril and resume lasix at lower dose    Gout, small joint effusion in the right knee - clinically improving and less tender to palpation  - elevated uric acid > 14 on admission - pt is not on allopurinol, consider upon discharge  - continue Colchicine but monitor renal function closely     HTN - will continue only metoprolol and Lisinopril for now - hold HCTZ, Lasix until renal function improves  - BP currently well controlled     DM, type II - hold metformin for now due to renal failure, will check A1C - place on SSI temporarily and readjust the regimen as indicated     Diastolic CHF-chronic - clinically euvolemic, due to renal  failure  - resume lasix today - monitor daily weights, strict I's and O's    Anemia of chronic disease - drop in Hg overnight, no signs of active bleed - repeat CBC in AM    HYPOKALEMIA - corrected     GERD (gastroesophageal reflux disease) - stable  Consultants:  Ortho  Interventional radiology   Procedures/Studies: Dg Chest 2 View  03/18/2013    1.  No active cardiopulmonary process.    Dg Knee 1-2 Views Right   03/19/2013   1.  No acute bony abnormality.  Small joint effusion.    Mr Lumbar Spine Wo Contrast  03/20/2013    1.  Mild lateral recess and foraminal narrowing bilaterally at L4-5 secondary to a broad-based disc herniation and facet hypertrophy.  2.  The most severe disease is at L5-S1 with moderate to severe lateral recess narrowing bilaterally, worse on the right.  3.  A broad-based disc herniation, facet hypertrophy, and a right- sided synovial cyst contribute to the disease at L5-S1.  4.  Moderate to severe right and moderate left foraminal stenosis at L5-S1.   Ct Abdomen Pelvis W Contrast  03/18/2013    1.  No acute findings within the abdomen or pelvis.  Specifically, no evidence of enteric or urinary obstruction.  2.  Moderate colonic stool burden.  Colonic diverticulosis without evidence of diverticulitis.  3.  Mild intra and extrahepatic biliary ductal dilatation, presumably the sequela of postcholecystectomy state.    Antibiotics:  None  Code Status: Full Family Communication: none at bedside Disposition  Plan: Home when medically stable, home health PT ordered   HPI/Subjective: No events overnight.   Objective: Filed Vitals:   03/20/13 0441 03/20/13 1400 03/20/13 2018 03/21/13 0557  BP: 121/73 125/75 134/57 118/57  Pulse: 72 80 77 60  Temp: 98.3 F (36.8 C) 97.9 F (36.6 C) 98 F (36.7 C) 97.4 F (36.3 C)  TempSrc: Oral  Oral Oral  Resp: 16 16 16 20   Height:   5' 2.5" (1.588 m)   Weight:   85 kg (187 lb 6.3 oz) 84.2 kg (185 lb 10 oz)   SpO2: 97% 99% 98% 100%    Intake/Output Summary (Last 24 hours) at 03/21/13 1236 Last data filed at 03/21/13 0848  Gross per 24 hour  Intake    600 ml  Output   1050 ml  Net   -450 ml    Exam:   General:  Pt is alert, follows commands appropriately, not in acute distress  Cardiovascular: Regular rate and rhythm, S1/S2, no murmurs, no rubs, no gallops  Respiratory: Clear to auscultation bilaterally, no wheezing, no crackles, no rhonchi  Abdomen: Soft, non tender, non distended, bowel sounds present, no guarding  Extremities: Trace bilateral pitting LE edema, pulses DP and PT palpable bilaterally, right knee slightly tender to palpation, no erythema and no warmth to touch, lumbar area tender to palpation    Neuro: Grossly nonfocal  Data Reviewed: Basic Metabolic Panel:  Recent Labs Lab 03/18/13 1650 03/19/13 0410 03/20/13 1521 03/21/13 0405  NA 134* 134*  --  136  K 3.3* 2.9*  --  3.9  CL 92* 95*  --  99  CO2 30 29  --  29  GLUCOSE 153* 150*  --  110*  BUN 33* 25*  --  25*  CREATININE 1.30* 1.18*  --  1.29*  CALCIUM 9.7 9.0  --  9.8  MG  --   --  1.7  --    Liver Function Tests:  Recent Labs Lab 03/18/13 1650  AST 13  ALT 7  ALKPHOS 123*  BILITOT 1.5*  PROT 9.0*  ALBUMIN 3.5    Recent Labs Lab 03/18/13 1650  LIPASE 14   CBC:  Recent Labs Lab 03/18/13 1650 03/19/13 0410 03/21/13 0405  WBC 14.3* 9.7 5.6  HGB 10.8* 9.2* 9.3*  HCT 32.6* 28.3* 29.3*  MCV 83.8 84.5 84.9  PLT 369 327 382   Cardiac Enzymes:  Recent Labs Lab 03/18/13 1650  TROPONINI <0.30   CBG:  Recent Labs Lab 03/19/13 2131 03/20/13 0716 03/20/13 1733 03/20/13 2224 03/21/13 0734  GLUCAP 127* 127* 112* 100* 85    Recent Results (from the past 240 hour(s))  CULTURE, BLOOD (ROUTINE X 2)     Status: None   Collection Time    03/18/13  6:15 PM      Result Value Range Status   Specimen Description BLOOD RIGHT ANTECUBITAL   Final   Special Requests BOTTLES DRAWN  AEROBIC AND ANAEROBIC 5CC   Final   Culture  Setup Time     Final   Value: 03/18/2013 23:06     Performed at [facility]   Culture     Final   Value:        BLOOD CULTURE RECEIVED NO GROWTH TO DATE CULTURE WILL BE HELD FOR 5 DAYS BEFORE ISSUING A FINAL NEGATIVE REPORT     Performed at [facility]   Report Status PENDING   Incomplete  CULTURE, BLOOD (ROUTINE X 2)  Status: None   Collection Time    03/18/13  9:38 PM      Result Value Range Status   Specimen Description BLOOD LEFT ARM   Final   Special Requests BOTTLES DRAWN AEROBIC AND ANAEROBIC 10CC EACH   Final   Culture  Setup Time     Final   Value: 03/19/2013 01:21     Performed at [facility]   Culture     Final   Value:        BLOOD CULTURE RECEIVED NO GROWTH TO DATE CULTURE WILL BE HELD FOR 5 DAYS BEFORE ISSUING A FINAL NEGATIVE REPORT     Performed at [facility]   Report Status PENDING   Incomplete     Scheduled Meds: . aspirin  325 mg Oral Daily  . citalopram  20 mg Oral Daily  . colchicine  0.6 mg Oral BID  . gabapentin  300 mg Oral Daily  . heparin  5,000 Units Subcutaneous Q8H  . insulin aspart  0-9 Units Subcutaneous TID WC  . lisinopril  40 mg Oral Daily  . metoprolol  100 mg Oral BID  .  morphine injection  2 mg Intravenous Once  . simvastatin  20 mg Oral QHS  . traZODone  100 mg Oral QHS   Continuous Infusions:  Zannie Cove, MD  [facility] Pager [phone]  If 7PM-7AM, please contact night-coverage [email or web address] Password [facility] 03/21/2013, 12:36 PM   LOS: 3 days

## 2013-03-21 NOTE — Progress Notes (Signed)
   Subjective: Patient reports pain as moderate.   Patient seen in rounds with Dr. Lequita Halt. Patient is still having problems with pain in the back, requiring pain medications She was able to get the injection earlier today. Hopefully, she will see some benefit quickly. Her right knee and left shoulder are also feeling better at this time.  Objective: Vital signs in last 24 hours: Temp:  [97.4 F (36.3 C)-98 F (36.7 C)] 97.8 F (36.6 C) (08/27 1434) Pulse Rate:  [52-77] 52 (08/27 1434) Resp:  [16-20] 18 (08/27 1434) BP: (114-134)/(57-60) 114/60 mmHg (08/27 1434) SpO2:  [98 %-100 %] 100 % (08/27 1434) Weight:  [84.2 kg (185 lb 10 oz)-85 kg (187 lb 6.3 oz)] 84.2 kg (185 lb 10 oz) (08/27 0557)  Intake/Output from previous day:  Intake/Output Summary (Last 24 hours) at 03/21/13 1725 Last data filed at 03/21/13 1720  Gross per 24 hour  Intake    480 ml  Output    950 ml  Net   -470 ml    Intake/Output this shift: Total I/O In: 240 [P.O.:240] Out: 400 [Urine:400]  Labs:  Recent Labs  03/19/13 0410 03/21/13 0405  HGB 9.2* 9.3*    Recent Labs  03/19/13 0410 03/21/13 0405  WBC 9.7 5.6  RBC 3.35* 3.45*  HCT 28.3* 29.3*  PLT 327 382    Recent Labs  03/19/13 0410 03/21/13 0405  NA 134* 136  K 2.9* 3.9  CL 95* 99  CO2 29 29  BUN 25* 25*  CREATININE 1.18* 1.29*  GLUCOSE 150* 110*  CALCIUM 9.0 9.8   No results found for this basename: LABPT, INR,  in the last 72 hours  EXAM General - Patient is Alert and Appropriate Extremity - Neurovascular intact  Sensation intact distally  Right knee - less swelling noted today. Motor Function - intact, moving foot and toes well on exam.    Past Medical History  Diagnosis Date  . Atypical face pain   . Hypertension   . CAD (coronary artery disease)     Catheterization 2006, mild two-vessel nonobstructive disease  . GERD (gastroesophageal reflux disease)   . Headache(784.0)   . Hyperlipidemia   . Arthritis     . Sleep apnea     CPAP compliance ???  . Diabetes mellitus   . Shortness of breath   . PAD (peripheral artery disease)     Dr. Darrick Penna,  Bilateral superficial femoral artery occlusions with one vessel runoff via the peroneal artery  . Ejection fraction     EF 60%, echo, October, 2010,  . Urinary incontinence   . Overweight(278.02)   . Mitral regurgitation     Mild, echo, October, 2010  . Fluid overload   . Foot pain, right     November, 2012  . Stroke   . UTI (lower urinary tract infection)     Assessment/Plan: Principal Problem:   Spinal stenosis of lumbar region Active Problems:   DM   HYPOKALEMIA   GERD (gastroesophageal reflux disease)   Trochanteric bursitis   Acute on chronic kidney failure   Diastolic CHF   Gout   Anemia of chronic disease  Estimated body mass index is 33.39 kg/(m^2) as calculated from the following:   Height as of this encounter: 5' 2.5" (1.588 m).   Weight as of this encounter: 84.2 kg (185 lb 10 oz).  Up as tolerated. Hopefully will be ready to go home by tomorrow.  [name] 03/21/2013, 5:25 PM

## 2013-03-21 NOTE — Progress Notes (Signed)
Physical Therapy Treatment Patient Details Name: [name] MRN: [id number] DOB: 1946-06-10 Today's Date: 03/21/2013 Time: 4098-1191 PT Time Calculation (min): 10 min  PT Assessment / Plan / Recommendation  History of Present Illness [name] is a 67 y.o. female who presents with 7 day history of worsening back, hip and R leg pain.  Pain is very severe to the point where she cannot ambulate for the past couple of days.  Pain is worse with palpation over the greater trochanter of her R femur. MRI lumbar pending.   PT Comments   Pt willing to participate but limited significantly by back, R LE pain. Plan is for epidural injection at [facility] today.  Follow Up Recommendations  Home health PT     Does the patient have the potential to tolerate intense rehabilitation     Barriers to Discharge        Equipment Recommendations  Rolling walker with 5" wheels;3in1 (PT)    Recommendations for Other Services OT consult  Frequency Min 5X/week   Progress towards PT Goals Progress towards PT goals: Progressing toward goals (but limited by pain)  Plan Current plan remains appropriate    Precautions / Restrictions Precautions Precautions: Back;Fall Restrictions Weight Bearing Restrictions: No   Pertinent Vitals/Pain 6/10 at rest; 10/10 with activity- back, R LE    Mobility  Bed Mobility Bed Mobility: Rolling Left;Left Sidelying to Sit;Sit to Sidelying Right Rolling Left: 4: Min guard Left Sidelying to Sit: 4: Min assist Sit to Sidelying Right: 4: Min assist Details for Bed Mobility Assistance: cues for log rolling, back precautions. Assist for trunk and LEs. Transfers Transfers: Sit to Stand;Stand to Sit Sit to Stand: 3: Mod assist;From bed Stand to Sit: 3: Mod assist;To bed Details for Transfer Assistance: extra time required for activity. Pt guarding back and RLE. Assist to rise, stabilize, control descent.  Ambulation/Gait Ambulation/Gait Assistance: 4: Min assist Ambulation  Distance (Feet): 15 Feet Assistive device: Rolling walker Ambulation/Gait Assistance Details: Increased time. Heavy reliance on bil UEs. Increased pain as mobility progressed. Short ambulation distance in room only due to pain.  Gait Pattern: Step-to pattern;Trunk flexed;Decreased stride length    Exercises     PT Diagnosis:    PT Problem List:   PT Treatment Interventions:     PT Goals (current goals can now be found in the care plan section)    Visit Information  Last PT Received On: 03/21/13 Assistance Needed: +2 (safety) History of Present Illness: [name] is a 67 y.o. female who presents with 7 day history of worsening back, hip and R leg pain.  Pain is very severe to the point where she cannot ambulate for the past couple of days.  Pain is worse with palpation over the greater trochanter of her R femur. MRI lumbar pending.    Subjective Data      Cognition  Cognition Arousal/Alertness: Awake/alert Behavior During Therapy: WFL for tasks assessed/performed Overall Cognitive Status: Within Functional Limits for tasks assessed    Balance     End of Session PT - End of Session Activity Tolerance: Patient limited by pain Patient left: in bed;with call bell/phone within reach   GP     Rebeca Alert, MPT Pager: [phone]

## 2013-03-21 NOTE — Procedures (Signed)
Right L5-S1 ESI No complication No blood loss. See complete dictation in [facility].

## 2013-03-21 NOTE — Plan of Care (Signed)
Problem: Consults Goal: Diabetes Guidelines if Diabetic/Glucose > 140 If diabetic or lab glucose is > 140 mg/dl - Initiate Diabetes/Hyperglycemia Guidelines & Document Interventions  Outcome: Progressing Monitor pt's cbgs to keep within normal limits. Teach pt how to check her blood sugars and to know the signs of high and low sugars.

## 2013-03-22 LAB — BASIC METABOLIC PANEL
BUN: 26 mg/dL — ABNORMAL HIGH (ref 6–23)
Chloride: 99 mEq/L (ref 96–112)
GFR calc Af Amer: 51 mL/min — ABNORMAL LOW (ref >90)
GFR calc non Af Amer: 44 mL/min — ABNORMAL LOW (ref >90)
Potassium: 5 mEq/L (ref 3.5–5.1)
Sodium: 136 mEq/L (ref 135–145)

## 2013-03-22 LAB — GLUCOSE, CAPILLARY
Glucose-Capillary: 127 mg/dL — ABNORMAL HIGH (ref 70–99)
Glucose-Capillary: 124 mg/dL — ABNORMAL HIGH (ref 70–99)

## 2013-03-22 MED ORDER — LISINOPRIL 40 MG PO TABS: 20.0000 mg | ORAL_TABLET | Freq: Every day | ORAL | Status: DC

## 2013-03-22 MED ORDER — OXYCODONE-ACETAMINOPHEN 5-325 MG PO TABS: 1.0000 | ORAL_TABLET | Freq: Four times a day (QID) | ORAL | Status: DC | PRN

## 2013-03-22 MED ORDER — ALLOPURINOL 100 MG PO TABS: 200.0000 mg | ORAL_TABLET | Freq: Every day | ORAL | Status: DC

## 2013-03-22 MED ORDER — COLCHICINE 0.6 MG PO TABS: 0.6000 mg | ORAL_TABLET | ORAL | Status: DC | PRN

## 2013-03-22 NOTE — Discharge Summary (Addendum)
Physician Discharge Summary  [name] ZOX:[id number] DOB: May 16, 1946 DOA: 03/18/2013  PCP: Warrick Parisian, MD  Admit date: 03/18/2013 Discharge date: 03/22/2013  Time spent: 45 minutes  Recommendations for Outpatient Follow-up:  1. PCP in 1 week 2. Bmet in 1 week 3. Dr.Alusio in 7-10days  Discharge Diagnoses:  Principal Problem:   Spinal stenosis of lumbar region Active Problems:   DM   HYPOKALEMIA   GERD (gastroesophageal reflux disease)   Trochanteric bursitis   Acute on chronic kidney failure   Diastolic CHF   Gout   Anemia of chronic disease   Discharge Condition: improved  Diet recommendation: low sodium, diabetic  Filed Weights   03/20/13 2018 03/21/13 0557 03/22/13 0508  Weight: 85 kg (187 lb 6.3 oz) 84.2 kg (185 lb 10 oz) 83.2 kg (183 lb 6.8 oz)    History of present illness:  [name] is a 67 y.o. female who presents with 7 day history of worsening back, hip and R leg pain. Pain is very severe to the point where she cannot ambulate for the past couple of days. Pain is worse with palpation over the greater trochanter of her R femur.  In the ED she did have a WBC of 14.3k and temperature of 99.4 but work up including CT abd/pelvis was negative for source of infection, pain was unable to be controlled and patient unable to walk so hospitalist has been called for the admit.  Hospital Course:  Severe low back pain with Spinal stenosis L5S1  - s/p Epidural steroid injection interventional radiology 8/27 - continue Narcotics PRN  - PT evaluation done and recommendation is to continue home health PT upon discharge  -FU with Dr.Alusio in 7-10 days  Acute on chronic kidney failure  -improved, back to baseline after stopping NSAID's and holding metformin, cutting down ACE. - once stabilized resumed lasix, ACE at dose and metformin at discharge, if kidney function worsens down the road, metformin will need to be stopped   Gout, small joint effusion in the  right knee  - clinically improving and less tender to palpation  - elevated uric acid > 14 on admission  - started on  allopurinol, upon discharge  - continue Colchicine during hospitalization and HCTZ stopped.   HTN  - will continue metoprolol and Lisinopril at lower dose for now  - stopped HCTZ due to gout flare and elevated uric acid  - BP currently well controlled   DM, type II  -metformin resumed at discharge, was on SSI during hospitalization   Diastolic CHF-chronic  - clinically euvolemic, - resumed lasix today   Anemia of chronic disease  - stable    Consultants:  Ortho  Interventional radiology    Procedures:  Epidural Steroid injection  Consultations:  Orthopedics dr.Alusio  Discharge Exam: Filed Vitals:   03/22/13 1331  BP: 120/53  Pulse: 56  Temp: 98.5 F (36.9 C)  Resp: 16    General: AAOx3 Cardiovascular: S1S2/RRR Respiratory:CTAB  Discharge Instructions  Discharge Orders   Future Orders Complete By Expires   Diet - low sodium heart healthy  As directed    Diet Carb Modified  As directed    Increase activity slowly  As directed        Medication List    STOP taking these medications       hydrochlorothiazide 25 MG tablet  Commonly known as:  HYDRODIURIL     naproxen 500 MG tablet  Commonly known as:  NAPROSYN  potassium chloride SA 20 MEQ tablet  Commonly known as:  K-DUR,KLOR-CON     traMADol 50 MG tablet  Commonly known as:  ULTRAM      TAKE these medications       allopurinol 100 MG tablet  Commonly known as:  ZYLOPRIM  Take 2 tablets (200 mg total) by mouth daily.     aspirin 325 MG tablet  Take 325 mg by mouth daily.     atorvastatin 20 MG tablet  Commonly known as:  LIPITOR  Take 20 mg by mouth daily.     citalopram 20 MG tablet  Commonly known as:  CELEXA  Take 20 mg by mouth daily.     colchicine 0.6 MG tablet  Take 1-2 tablets (0.6-1.2 mg total) by mouth as needed. As needed for Gout Flares      furosemide 40 MG tablet  Commonly known as:  LASIX  Take 40 mg by mouth daily.     gabapentin 300 MG capsule  Commonly known as:  NEURONTIN  Take 300 mg by mouth daily.     lisinopril 40 MG tablet  Commonly known as:  PRINIVIL,ZESTRIL  Take 0.5 tablets (20 mg total) by mouth daily.     metFORMIN 1000 MG tablet  Commonly known as:  GLUCOPHAGE  Take 1,000 mg by mouth 2 (two) times daily with a meal.     metoprolol 100 MG tablet  Commonly known as:  LOPRESSOR  Take 100 mg by mouth 2 (two) times daily.     nitroGLYCERIN 0.4 MG SL tablet  Commonly known as:  NITROSTAT  Place 1 tablet (0.4 mg total) under the tongue every 5 (five) minutes as needed. For chest pain     oxyCODONE-acetaminophen 5-325 MG per tablet  Commonly known as:  PERCOCET/ROXICET  Take 1 tablet by mouth every 6 (six) hours as needed for pain.     simvastatin 40 MG tablet  Commonly known as:  ZOCOR  Take 20 mg by mouth at bedtime.     traZODone 100 MG tablet  Commonly known as:  DESYREL  Take 100 mg by mouth at bedtime.     triamcinolone cream 0.1 %  Commonly known as:  KENALOG  Apply 1 application topically 2 (two) times daily.     zolpidem 10 MG tablet  Commonly known as:  AMBIEN  Take 10 mg by mouth at bedtime as needed. For sleep       Allergies  Allergen Reactions  . Amoxicillin     Yeast infection  . Latex Itching       Follow-up Information   Schedule an appointment as soon as possible for a visit with Warrick Parisian, MD.   Specialty:  Family Medicine   Contact information:   [address] [place] [address] [phone]       Follow up with Loanne Drilling, MD. Schedule an appointment as soon as possible for a visit in 2 weeks. (Call office for appointment time.)    Specialty:  Orthopedic Surgery   Contact information:   [address] [place] [address] [phone]       Follow up with Bmet  In 1 week. (at PCP office)        The results of  significant diagnostics from this hospitalization (including imaging, microbiology, ancillary and laboratory) are listed below for reference.    Significant Diagnostic Studies: Dg Chest 2 View  03/18/2013   CLINICAL DATA:  Weakness. History of diabetes and  mitral regurgitation.  EXAM: CHEST  2 VIEW  COMPARISON:  12/27/2011.  FINDINGS: There is suboptimal inspiration on the lateral view. The heart size and mediastinal contours are stable with aortic atherosclerosis. The lungs appear clear. There is no pleural effusion or pneumothorax. No acute osseous findings are seen. Telemetry leads overlie the chest.  IMPRESSION: No active cardiopulmonary process.   Electronically Signed   By: Roxy Horseman   On: 03/18/2013 18:57   Dg Lumbar Spine Complete  02/27/2013   *RADIOLOGY REPORT*  Clinical Data: Or back pain.  LUMBAR SPINE - COMPLETE 4+ VIEW  Comparison: 04/23/2012 study.  Findings: There are five non-rib bearing lumbar-type vertebral bodies.  Intervertebral disc spaces are maintained.  Multilevel marginal osteophyte formation is seen with changes of degenerative spondylosis  Cholecystectomy clips are present.  Pelvic phleboliths are present. Nonaneurysmal aortic and arterial calcifications are present.  No fracture or bony destruction is evident.  No significant subluxation or is seen.  No dislocation is evident.  No pars defects are seen.  There is minimal apophyseal joint degenerative spondylosis.  IMPRESSION: . Changes of degenerative disc disease and degenerative spondylosis.  No fracture or bony destruction.  Post cholecystectomy.  Nonaneurysmal aortic and arterial calcifications.  Clinically significant discrepancy from primary report, if provided: None   Original Report Authenticated By: Onalee Hua Call   Dg Knee 1-2 Views Right  03/19/2013   CLINICAL DATA:  Right knee pain, effusion.  EXAM: RIGHT KNEE - 1-2 VIEW  COMPARISON:  06/01/2010  FINDINGS: There is a small joint effusion. Early degenerative spurring  and joint space narrowing, similar to prior study. Sclerotic focus within the distal femur could represent enchondroma or old infarct. This is stable. No fracture, subluxation or dislocation.  IMPRESSION: No acute bony abnormality.  Small joint effusion.   Electronically Signed   By: Charlett Nose   On: 03/19/2013 17:22   Mr Lumbar Spine Wo Contrast  03/20/2013   *RADIOLOGY REPORT*  Clinical Data: Right-sided back pain with right leg radicular pain.  MRI LUMBAR SPINE WITHOUT CONTRAST  Technique:  Multiplanar and multiecho pulse sequences of the lumbar spine were obtained without intravenous contrast.  Comparison: CT abdomen and pelvis 03/18/2013.  Findings: Normal signal is present in the conus medullaris which terminates at L1-2.  Leftward curvature of lumbar spine is centered at L3-4.  Marrow signal somewhat heterogeneous fatty end plate marrow changes along the inferior plate of L4 and L5.  Limited imaging of the abdomen is unremarkable.  The disc levels at L2-3 above are normal.  L3-4:  Mild facet hypertrophy is present.  There is no significant stenosis.  L4-5:  A mild broad-based disc herniation is present.  Moderate facet hypertrophy is seen bilaterally.  Bilateral joint effusions are noted.  This results in mild lateral recess and foraminal narrowing bilaterally.  The foraminal disease is slightly worse on the left.  L5-S1:  A broad-based disc herniation is present.  Moderate facet hypertrophy is present bilaterally.  Prominent joint effusions are evident.  A small synovial cyst on the right contributes to lateral recess narrowing.  Moderate to severe lateral recess narrowing is evident bilaterally, right greater than left.  Moderate to severe right and moderate left foraminal stenosis is evident.  IMPRESSION:  1.  Mild lateral recess and foraminal narrowing bilaterally at L4-5 secondary to a broad-based disc herniation and facet hypertrophy. 2.  The most severe disease is at L5-S1 with moderate to severe  lateral recess narrowing bilaterally, worse on the right. 3.  A broad-based disc herniation, facet hypertrophy, and a right- sided synovial cyst contribute to the disease at L5-S1. 4.  Moderate to severe right and moderate left foraminal stenosis at L5-S1.   Original Report Authenticated By: Marin Roberts, M.D.   Ct Abdomen Pelvis W Contrast  03/18/2013   *RADIOLOGY REPORT*  Clinical Data: Abdominal pain, recent urinary tract infection, generalized weakness  CT ABDOMEN AND PELVIS WITH CONTRAST  Technique:  Multidetector CT imaging of the abdomen and pelvis was performed following the standard protocol during bolus administration of intravenous contrast.  Contrast: 80mL OMNIPAQUE IOHEXOL 300 MG/ML  SOLN, 50mL OMNIPAQUE IOHEXOL 300 MG/ML  SOLN  Comparison: None.  Findings:  Normal hepatic contour.  There is a sub centimeter hypoattenuating lesion in the dome of the right lobe the liver (image 8, series 2) which is too small to accurately characterize.  Post cholecystectomy.  There is dilatation of the common bile duct measuring approximately 1.4 cm in greatest oblique coronal dimension (coronal image 41, series 3).  There is minimal centralized intrahepatic biliary duct dilatation, presumably sequelae of postcholecystectomy state.  The pancreas is atrophic but otherwise normal.  No peripancreatic stranding.  No ascites.  There is symmetric enhancement and excretion of the bilateral kidneys.  Punctate calcifications about the bilateral splenic hila are favored to be vascular in etiology.  No definite renal stones. No urinary obstruction or perinephric stranding. Note is made of a small (approximately 1.8 x 1.5 cm diverticulum arising from the left lateral aspect of the dome of the urinary bladder (image 72, series 2).  Normal appearance of the bilateral adrenal glands. Normal appearance of the spleen.  Incidental note is made of a small splenule.  A minimal amount of enteric contrast has been ingested is seen  extending to the level of the distal small bowel.  Moderate colonic stool burden without evidence of obstruction.  Colonic diverticulosis without evidence of diverticulitis.  No pneumoperitoneum, pneumatosis or portal venous gas.  Scattered atherosclerotic plaque within a normal caliber abdominal aorta.  The major branch vessels of the abdominal aorta appear patent on this non CTA examination.  No definite retroperitoneal, mesenteric, pelvic or inguinal lymphadenopathy.  Post hysterectomy.  No discrete adnexal lesion.  No free fluid within the pelvis.  Limited visualization of the lower thorax demonstrates minimal bibasilar dependent ground-glass atelectasis.  There is a small (approximately 1.6 cm cyst within the imaged right lower lobe as well as a smaller sub centimeter cyst within the right middle lobe. No focal airspace opacities.  No pleural effusion.  Normal heart size.  No pericardial effusion.  No acute or aggressive osseous abnormalities.  Mild to moderate multilevel lumbar spine DDD.  IMPRESSION: 1.  No acute findings within the abdomen or pelvis.  Specifically, no evidence of enteric or urinary obstruction. 2.  Moderate colonic stool burden.  Colonic diverticulosis without evidence of diverticulitis. 3.  Mild intra and extrahepatic biliary ductal dilatation, presumably the sequela of postcholecystectomy state.   Original Report Authenticated By: Tacey Ruiz, MD   Ir Epidurography  03/21/2013   *RADIOLOGY REPORT*  Clinical Data:  Lumbosacral spondylosis.  Low back and right lower extremity pain.  MR demonstrates degenerative change   most marked L4-5 and L5-S1.  No previous lumbar surgery.  Procedure: The procedure, risks, benefits, and alternatives were explained to the patient. Questions regarding the procedure were encouraged and answered. The patient understands and consents to the procedure.  LUMBAR EPIDURAL INJECTION: An interlaminar approach was performed on the right at  L5-S1.  Operator donned  sterile gloves and mask. The overlying skin was cleansed and anesthetized.  A 20 gauge Crawford epidural needle was advanced using loss-of-resistance technique.  DIAGNOSTIC EPIDURAL INJECTION: Injection of Omnipaque 180 shows a good epidural pattern with spread above and below the level of needle placement. No intrathecal or vascular opacification is seen.  THERAPEUTIC EPIDURAL INJECTION: 120mg  of Depo-Medrol mixed with 5ml lidocaine 1% were instilled.  The procedure was well-tolerated, and the patient was discharged thirty minutes following the injection in good condition.  Fluoroscopy Time: 18 seconds  Complications: none  IMPRESSION: Technically successful epidural injection on the right at L5-S1.   Original Report Authenticated By: D. Andria Rhein, MD    Microbiology: Recent Results (from the past 240 hour(s))  CULTURE, BLOOD (ROUTINE X 2)     Status: None   Collection Time    03/18/13  6:15 PM      Result Value Range Status   Specimen Description BLOOD RIGHT ANTECUBITAL   Final   Special Requests BOTTLES DRAWN AEROBIC AND ANAEROBIC 5CC   Final   Culture  Setup Time     Final   Value: 03/18/2013 23:06     Performed at [facility]   Culture     Final   Value:        BLOOD CULTURE RECEIVED NO GROWTH TO DATE CULTURE WILL BE HELD FOR 5 DAYS BEFORE ISSUING A FINAL NEGATIVE REPORT     Performed at [facility]   Report Status PENDING   Incomplete  CULTURE, BLOOD (ROUTINE X 2)     Status: None   Collection Time    03/18/13  9:38 PM      Result Value Range Status   Specimen Description BLOOD LEFT ARM   Final   Special Requests BOTTLES DRAWN AEROBIC AND ANAEROBIC 10CC EACH   Final   Culture  Setup Time     Final   Value: 03/19/2013 01:21     Performed at [facility]   Culture     Final   Value:        BLOOD CULTURE RECEIVED NO GROWTH TO DATE CULTURE WILL BE HELD FOR 5 DAYS BEFORE ISSUING A FINAL NEGATIVE REPORT     Performed at [facility]   Report Status  PENDING   Incomplete     Labs: Basic Metabolic Panel:  Recent Labs Lab 03/18/13 1650 03/19/13 0410 03/20/13 1521 03/21/13 0405 03/22/13 0355  NA 134* 134*  --  136 136  K 3.3* 2.9*  --  3.9 5.0  CL 92* 95*  --  99 99  CO2 30 29  --  29 28  GLUCOSE 153* 150*  --  110* 121*  BUN 33* 25*  --  25* 26*  CREATININE 1.30* 1.18*  --  1.29* 1.23*  CALCIUM 9.7 9.0  --  9.8 9.4  MG  --   --  1.7  --   --    Liver Function Tests:  Recent Labs Lab 03/18/13 1650  AST 13  ALT 7  ALKPHOS 123*  BILITOT 1.5*  PROT 9.0*  ALBUMIN 3.5    Recent Labs Lab 03/18/13 1650  LIPASE 14   No results found for this basename: AMMONIA,  in the last 168 hours CBC:  Recent Labs Lab 03/18/13 1650 03/19/13 0410 03/21/13 0405  WBC 14.3* 9.7 5.6  HGB 10.8* 9.2* 9.3*  HCT 32.6* 28.3* 29.3*  MCV 83.8 84.5 84.9  PLT 369  327 382   Cardiac Enzymes:  Recent Labs Lab 03/18/13 1650  TROPONINI <0.30   BNP: BNP (last 3 results) No results found for this basename: PROBNP,  in the last 8760 hours CBG:  Recent Labs Lab 03/21/13 0734 03/21/13 1634 03/21/13 2224 03/22/13 0739 03/22/13 1221  GLUCAP 85 132* 157* 127* 124*       Signed:  Ted Leonhart  [facility] 03/22/2013, 2:47 PM

## 2013-03-22 NOTE — Progress Notes (Signed)
Physical Therapy Treatment Patient Details Name: [name] MRN: [id number] DOB: Dec 12, 1945 Today's Date: 03/22/2013 Time: 0900-0945 PT Time Calculation (min): 45 min  PT Assessment / Plan / Recommendation  History of Present Illness [name] is a 67 y.o. female who presents with 7 day history of worsening back, hip and R leg pain.  Pain is very severe to the point where she cannot ambulate for the past couple of days.  S/P epidural injection 8/27   PT Comments   Progressing with mobility but still limited significantly by pain. Recommend Home Health Aide (if possible) to assist pt with ADLs/meals.   Follow Up Recommendations  Home health PT; Home Health OT; Home Health Aide     Does the patient have the potential to tolerate intense rehabilitation     Barriers to Discharge        Equipment Recommendations  Rolling walker with 5" wheels;3in1 (PT)    Recommendations for Other Services OT consult  Frequency Min 5X/week   Progress towards PT Goals Progress towards PT goals: Progressing toward goals  Plan Current plan remains appropriate    Precautions / Restrictions Precautions Precautions: Back;Fall Restrictions Weight Bearing Restrictions: No   Pertinent Vitals/Pain 7/10 at rest; 10/10 with activity back, R LE    Mobility  Bed Mobility Bed Mobility: Rolling Left;Left Sidelying to Sit Rolling Left: 4: Min guard Left Sidelying to Sit: 4: Min assist Details for Bed Mobility Assistance: cues for log rolling, back precautions. Assist for trunk and LEs. Transfers Transfers: Sit to Stand;Stand to Sit Sit to Stand: 4: Min assist;From chair/3-in-1;From bed Stand to Sit: 4: Min assist;To chair/3-in-1 Details for Transfer Assistance: extra time required for activity. Pt guarding back and RLE. Assist to rise, stabilize, control descent.  Ambulation/Gait Ambulation/Gait Assistance: 4: Min assist Ambulation Distance (Feet): 60 Feet Assistive device: Rolling  walker Ambulation/Gait Assistance Details: Increased time. Heavy reliance on bil UEs. Reported pain 10/10 with ambulation.  Gait Pattern: Step-to pattern;Trunk flexed;Decreased stride length    Exercises     PT Diagnosis:    PT Problem List:   PT Treatment Interventions:     PT Goals (current goals can now be found in the care plan section)    Visit Information  Last PT Received On: 03/22/13 Assistance Needed: +1 History of Present Illness: [name] is a 67 y.o. female who presents with 7 day history of worsening back, hip and R leg pain.  Pain is very severe to the point where she cannot ambulate for the past couple of days.  S/P epidural injection 8/27    Subjective Data      Cognition  Cognition Arousal/Alertness: Awake/alert Behavior During Therapy: WFL for tasks assessed/performed Overall Cognitive Status: Within Functional Limits for tasks assessed    Balance     End of Session PT - End of Session Equipment Utilized During Treatment: Gait belt Activity Tolerance: Patient limited by pain Patient left: in chair;with call bell/phone within reach   GP     Rebeca Alert, MPT Pager: [phone]

## 2013-03-22 NOTE — Progress Notes (Signed)
   Subjective: Patient reports pain as better today..   Patient seen in rounds with Dr. Lequita Halt. Patient is well, but has had some minor complaints of pain in the back, requiring pain medications.  She is able to move around in the bed a little better today.  Hopefully, the injection will continue to improve and help. Plan is to go Home after hospital stay.  Objective: Vital signs in last 24 hours: Temp:  [97.8 F (36.6 C)] 97.8 F (36.6 C) (08/28 0508) Pulse Rate:  [52-65] 52 (08/28 0508) Resp:  [16-18] 18 (08/28 0508) BP: (114-133)/(55-64) 133/64 mmHg (08/28 0508) SpO2:  [94 %-100 %] 100 % (08/28 0508) Weight:  [83.2 kg (183 lb 6.8 oz)] 83.2 kg (183 lb 6.8 oz) (08/28 0508)  Intake/Output from previous day:  Intake/Output Summary (Last 24 hours) at 03/22/13 0749 Last data filed at 03/22/13 0506  Gross per 24 hour  Intake    240 ml  Output   1400 ml  Net  -1160 ml    Intake/Output this shift:    Labs:  Recent Labs  03/21/13 0405  HGB 9.3*    Recent Labs  03/21/13 0405  WBC 5.6  RBC 3.45*  HCT 29.3*  PLT 382    Recent Labs  03/21/13 0405 03/22/13 0355  NA 136 136  K 3.9 5.0  CL 99 99  CO2 29 28  BUN 25* 26*  CREATININE 1.29* 1.23*  GLUCOSE 110* 121*  CALCIUM 9.8 9.4   No results found for this basename: LABPT, INR,  in the last 72 hours  EXAM General - Patient is Alert and Appropriate Extremity - Neurovascular intact  Sensation intact distally  Motor Function - intact, moving foot and toes well on exam.    Past Medical History  Diagnosis Date  . Atypical face pain   . Hypertension   . CAD (coronary artery disease)     Catheterization 2006, mild two-vessel nonobstructive disease  . GERD (gastroesophageal reflux disease)   . Headache(784.0)   . Hyperlipidemia   . Arthritis   . Sleep apnea     CPAP compliance ???  . Diabetes mellitus   . Shortness of breath   . PAD (peripheral artery disease)     Dr. Darrick Penna,  Bilateral superficial  femoral artery occlusions with one vessel runoff via the peroneal artery  . Ejection fraction     EF 60%, echo, October, 2010,  . Urinary incontinence   . Overweight(278.02)   . Mitral regurgitation     Mild, echo, October, 2010  . Fluid overload   . Foot pain, right     November, 2012  . Stroke   . UTI (lower urinary tract infection)     Assessment/Plan: Principal Problem:   Spinal stenosis of lumbar region Active Problems:   DM   HYPOKALEMIA   GERD (gastroesophageal reflux disease)   Trochanteric bursitis   Acute on chronic kidney failure   Diastolic CHF   Gout   Anemia of chronic disease  Estimated body mass index is 32.99 kg/(m^2) as calculated from the following:   Height as of this encounter: 5' 2.5" (1.588 m).   Weight as of this encounter: 83.2 kg (183 lb 6.8 oz). Up with therapy Walker for home use. Follow up in the office in a couple of weeks. Okay for discharge from on orthopedic standpoint. Will sign off. Please call for any questions.  PERKINS, ALEXZANDREW 03/22/2013, 7:49 AM

## 2013-03-22 NOTE — Progress Notes (Signed)
Stopped to visit patient today. She says she is probably going home as she is feeling a bit better. Listened, gave support and encouragement. Offered prayer that she would be sustained and surrounded by God's love as she returns home and would know of his peace and calm in the days ahead.

## 2013-03-24 LAB — CULTURE, BLOOD (ROUTINE X 2)

## 2013-03-25 LAB — CULTURE, BLOOD (ROUTINE X 2)

## 2013-04-03 LAB — BASIC METABOLIC PANEL
BUN: 38 mg/dL — AB (ref 4–21)
Creatinine: 1.7 mg/dL — AB (ref 0.5–1.1)
Glucose: 99 mg/dL
POTASSIUM: 4.1 mmol/L (ref 3.4–5.3)
Sodium: 137 mmol/L (ref 137–147)

## 2013-04-03 LAB — HEPATIC FUNCTION PANEL
ALT: 24 U/L (ref 7–35)
AST: 22 U/L (ref 13–35)
Bilirubin, Total: 0.5 mg/dL

## 2013-05-11 ENCOUNTER — Ambulatory Visit: Payer: Medicare Other

## 2013-05-11 ENCOUNTER — Ambulatory Visit (INDEPENDENT_AMBULATORY_CARE_PROVIDER_SITE_OTHER): Payer: Medicare Other | Admitting: Family Medicine

## 2013-05-11 VITALS — BP 126/72 | HR 62 | Temp 98.6°F | Resp 16 | Ht 62.0 in | Wt 178.4 lb

## 2013-05-11 DIAGNOSIS — M5441 Lumbago with sciatica, right side: Secondary | ICD-10-CM

## 2013-05-11 DIAGNOSIS — M25561 Pain in right knee: Secondary | ICD-10-CM

## 2013-05-11 DIAGNOSIS — M5430 Sciatica, unspecified side: Secondary | ICD-10-CM

## 2013-05-11 DIAGNOSIS — Z8739 Personal history of other diseases of the musculoskeletal system and connective tissue: Secondary | ICD-10-CM

## 2013-05-11 DIAGNOSIS — Z8669 Personal history of other diseases of the nervous system and sense organs: Secondary | ICD-10-CM

## 2013-05-11 DIAGNOSIS — M25569 Pain in unspecified knee: Secondary | ICD-10-CM

## 2013-05-11 DIAGNOSIS — Z862 Personal history of diseases of the blood and blood-forming organs and certain disorders involving the immune mechanism: Secondary | ICD-10-CM

## 2013-05-11 MED ORDER — HYDROCODONE-ACETAMINOPHEN 5-325 MG PO TABS: 1.0000 | ORAL_TABLET | Freq: Four times a day (QID) | ORAL | Status: DC | PRN

## 2013-05-11 MED ORDER — INDOMETHACIN 25 MG PO CAPS: 25.0000 mg | ORAL_CAPSULE | Freq: Two times a day (BID) | ORAL | Status: DC

## 2013-05-11 NOTE — Progress Notes (Addendum)
Subjective:    Patient ID: [name], female    DOB: 1945/08/12, 67 y.o.   MRN: [id number] This chart was scribed for Meredith Staggers, MD by Valera Castle, ED Scribe. This patient was seen in room 10 and the patient's care was started at 3:00 PM.  Note completed by me, but unable to change author. Silas Sacramento, MD.   HPI [name] is a 67 y.o. female with multiple medical problems as listed below including spinal stenosis with hospitalization from 03/18/2013-03/22/2013, and gout exacerbation in 08/2012 treated by Dr. Dareen Piano and then again on 01/2013 by Dr. Nedra Hai, who presents to the [facility] complaining of right knee pain, onset 4 days ago after moving out of her place. She denies any acute injury while she was packing and lifting, just that she woke up the next morning with right knee swelling. She states that the pain has kept her awake at night. She states that the muscles are sore all around her knee, and that movement exacerbates the pain. Attempted treatment for her knee includes Colchicine and indomethacin BID past 3 days, with no relief.She denies f/u with her orthopedic doctor since being hospitalized. She states her PCP had given her Oxycodone, but reports that she has run out. Only took these at night prior, and no side effects when did take this. Hx of CAD unsuccessful stent placement few years ago, and diastolic CHF by notes. Has walker at home if needed, but has not been using this.    Right knee x-ray results 03/19/2013 FINDINGS:  There is a small joint effusion. Early degenerative spurring and  joint space narrowing, similar to prior study. Sclerotic focus  within the distal femur could represent enchondroma or old infarct.  This is stable. No fracture, subluxation or dislocation.  IMPRESSION:  No acute bony abnormality. Small joint effusion.  She also reports lower back pain, onset 2 days ago, that radiates down to her right knee. She reports that the knee pain started before  her back pain. She denies bowel or bladder dysfunction, LE weakness, and any other associated symptoms. She denies h/o heart problems.   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  PCP Warrick Parisian, MD   Results for orders placed during the hospital encounter of 03/18/13  CULTURE, BLOOD (ROUTINE X 2)      Result Value Range   Specimen Description BLOOD LEFT ARM     Special Requests BOTTLES DRAWN AEROBIC AND ANAEROBIC 10CC EACH     Culture  Setup Time       Value: 03/19/2013 01:21     Performed at [facility]   Culture       Value: NO GROWTH 5 DAYS     Performed at [facility]   Report Status 03/25/2013 FINAL    CULTURE, BLOOD (ROUTINE X 2)      Result Value Range   Specimen Description BLOOD RIGHT ANTECUBITAL     Special Requests BOTTLES DRAWN AEROBIC AND ANAEROBIC 5CC     Culture  Setup Time       Value: 03/18/2013 23:06     Performed at [facility]   Culture       Value: NO GROWTH 5 DAYS     Performed at [facility]   Report Status 03/24/2013 FINAL    URINALYSIS, ROUTINE W REFLEX MICROSCOPIC      Result Value Range   Color, Urine AMBER (*) YELLOW   APPearance CLOUDY (*)  CLEAR   Specific Gravity, Urine 1.022  1.005 - 1.030   pH 5.0  5.0 - 8.0   Glucose, UA NEGATIVE  NEGATIVE mg/dL   Hgb urine dipstick NEGATIVE  NEGATIVE   Bilirubin Urine NEGATIVE  NEGATIVE   Ketones, ur NEGATIVE  NEGATIVE mg/dL   Protein, ur NEGATIVE  NEGATIVE mg/dL   Urobilinogen, UA 0.2  0.0 - 1.0 mg/dL   Nitrite NEGATIVE  NEGATIVE   Leukocytes, UA SMALL (*) NEGATIVE  CBC      Result Value Range   WBC 14.3 (*) 4.0 - 10.5 K/uL   RBC 3.89  3.87 - 5.11 MIL/uL   Hemoglobin 10.8 (*) 12.0 - 15.0 g/dL   HCT 40.9 (*) 81.1 - 91.4 %   MCV 83.8  78.0 - 100.0 fL   MCH 27.8  26.0 - 34.0 pg   MCHC 33.1  30.0 - 36.0 g/dL   RDW 78.2  95.6 - 21.3 %   Platelets 369  150 - 400 K/uL  COMPREHENSIVE METABOLIC PANEL      Result Value Range   Sodium 134 (*) 135 - 145  mEq/L   Potassium 3.3 (*) 3.5 - 5.1 mEq/L   Chloride 92 (*) 96 - 112 mEq/L   CO2 30  19 - 32 mEq/L   Glucose, Bld 153 (*) 70 - 99 mg/dL   BUN 33 (*) 6 - 23 mg/dL   Creatinine, Ser 0.86 (*) 0.50 - 1.10 mg/dL   Calcium 9.7  8.4 - 57.8 mg/dL   Total Protein 9.0 (*) 6.0 - 8.3 g/dL   Albumin 3.5  3.5 - 5.2 g/dL   AST 13  0 - 37 U/L   ALT 7  0 - 35 U/L   Alkaline Phosphatase 123 (*) 39 - 117 U/L   Total Bilirubin 1.5 (*) 0.3 - 1.2 mg/dL   GFR calc non Af Amer 41 (*) >90 mL/min   GFR calc Af Amer 47 (*) >90 mL/min  TROPONIN I      Result Value Range   Troponin I <0.30  <0.30 ng/mL  GLUCOSE, CAPILLARY      Result Value Range   Glucose-Capillary 134 (*) 70 - 99 mg/dL  URINE MICROSCOPIC-ADD ON      Result Value Range   Squamous Epithelial / LPF RARE  RARE   WBC, UA 0-2  <3 WBC/hpf   Urine-Other AMORPHOUS URATES/PHOSPHATES    LIPASE, BLOOD      Result Value Range   Lipase 14  11 - 59 U/L  URIC ACID      Result Value Range   Uric Acid, Serum 14.5 (*) 2.4 - 7.0 mg/dL  CBC      Result Value Range   WBC 9.7  4.0 - 10.5 K/uL   RBC 3.35 (*) 3.87 - 5.11 MIL/uL   Hemoglobin 9.2 (*) 12.0 - 15.0 g/dL   HCT 46.9 (*) 62.9 - 52.8 %   MCV 84.5  78.0 - 100.0 fL   MCH 27.5  26.0 - 34.0 pg   MCHC 32.5  30.0 - 36.0 g/dL   RDW 41.3  24.4 - 01.0 %   Platelets 327  150 - 400 K/uL  BASIC METABOLIC PANEL      Result Value Range   Sodium 134 (*) 135 - 145 mEq/L   Potassium 2.9 (*) 3.5 - 5.1 mEq/L   Chloride 95 (*) 96 - 112 mEq/L   CO2 29  19 - 32 mEq/L   Glucose, Bld 150 (*) 70 -  99 mg/dL   BUN 25 (*) 6 - 23 mg/dL   Creatinine, Ser 9.14 (*) 0.50 - 1.10 mg/dL   Calcium 9.0  8.4 - 78.2 mg/dL   GFR calc non Af Amer 46 (*) >90 mL/min   GFR calc Af Amer 53 (*) >90 mL/min  GLUCOSE, CAPILLARY      Result Value Range   Glucose-Capillary 123 (*) 70 - 99 mg/dL  GLUCOSE, CAPILLARY      Result Value Range   Glucose-Capillary 182 (*) 70 - 99 mg/dL  URIC ACID      Result Value Range   Uric Acid, Serum  12.0 (*) 2.4 - 7.0 mg/dL  GLUCOSE, CAPILLARY      Result Value Range   Glucose-Capillary 142 (*) 70 - 99 mg/dL  GLUCOSE, CAPILLARY      Result Value Range   Glucose-Capillary 127 (*) 70 - 99 mg/dL  GLUCOSE, CAPILLARY      Result Value Range   Glucose-Capillary 127 (*) 70 - 99 mg/dL  HEMOGLOBIN N5A      Result Value Range   Hemoglobin A1C 6.5 (*) <5.7 %   Mean Plasma Glucose 140 (*) <117 mg/dL  MAGNESIUM      Result Value Range   Magnesium 1.7  1.5 - 2.5 mg/dL  CBC      Result Value Range   WBC 5.6  4.0 - 10.5 K/uL   RBC 3.45 (*) 3.87 - 5.11 MIL/uL   Hemoglobin 9.3 (*) 12.0 - 15.0 g/dL   HCT 21.3 (*) 08.6 - 57.8 %   MCV 84.9  78.0 - 100.0 fL   MCH 27.0  26.0 - 34.0 pg   MCHC 31.7  30.0 - 36.0 g/dL   RDW 46.9  62.9 - 52.8 %   Platelets 382  150 - 400 K/uL  BASIC METABOLIC PANEL      Result Value Range   Sodium 136  135 - 145 mEq/L   Potassium 3.9  3.5 - 5.1 mEq/L   Chloride 99  96 - 112 mEq/L   CO2 29  19 - 32 mEq/L   Glucose, Bld 110 (*) 70 - 99 mg/dL   BUN 25 (*) 6 - 23 mg/dL   Creatinine, Ser 4.13 (*) 0.50 - 1.10 mg/dL   Calcium 9.8  8.4 - 24.4 mg/dL   GFR calc non Af Amer 41 (*) >90 mL/min   GFR calc Af Amer 48 (*) >90 mL/min  GLUCOSE, CAPILLARY      Result Value Range   Glucose-Capillary 112 (*) 70 - 99 mg/dL   Comment 1 Notify RN     Comment 2 Documented in Chart    GLUCOSE, CAPILLARY      Result Value Range   Glucose-Capillary 100 (*) 70 - 99 mg/dL  GLUCOSE, CAPILLARY      Result Value Range   Glucose-Capillary 85  70 - 99 mg/dL  GLUCOSE, CAPILLARY      Result Value Range   Glucose-Capillary 132 (*) 70 - 99 mg/dL  BASIC METABOLIC PANEL      Result Value Range   Sodium 136  135 - 145 mEq/L   Potassium 5.0  3.5 - 5.1 mEq/L   Chloride 99  96 - 112 mEq/L   CO2 28  19 - 32 mEq/L   Glucose, Bld 121 (*) 70 - 99 mg/dL   BUN 26 (*) 6 - 23 mg/dL   Creatinine, Ser 0.10 (*) 0.50 - 1.10 mg/dL   Calcium 9.4  8.4 -  10.5 mg/dL   GFR calc non Af Amer 44 (*) >90  mL/min   GFR calc Af Amer 51 (*) >90 mL/min  GLUCOSE, CAPILLARY      Result Value Range   Glucose-Capillary 157 (*) 70 - 99 mg/dL  GLUCOSE, CAPILLARY      Result Value Range   Glucose-Capillary 127 (*) 70 - 99 mg/dL   Comment 1 Documented in Chart     Comment 2 Notify RN    GLUCOSE, CAPILLARY      Result Value Range   Glucose-Capillary 124 (*) 70 - 99 mg/dL    Past Medical History  Diagnosis Date   Atypical face pain    Hypertension    CAD (coronary artery disease)     Catheterization 2006, mild two-vessel nonobstructive disease   GERD (gastroesophageal reflux disease)    Headache(784.0)    Hyperlipidemia    Arthritis    Sleep apnea     CPAP compliance ???   Diabetes mellitus    Shortness of breath    PAD (peripheral artery disease)     Dr. Darrick Penna,  Bilateral superficial femoral artery occlusions with one vessel runoff via the peroneal artery   Ejection fraction     EF 60%, echo, October, 2010,   Urinary incontinence    Overweight(278.02)    Mitral regurgitation     Mild, echo, October, 2010   Fluid overload    Foot pain, right     November, 2012   Stroke    UTI (lower urinary tract infection)    Past Surgical History  Procedure Laterality Date   Cesarean section     Abdominal hysterectomy     Eye surgery  12/11/10   Vesicovaginal fistula closure w/ tah     Artery biopsy  12/27/2011    Procedure: BIOPSY TEMPORAL ARTERY;  Surgeon: Sherren Kerns, MD;  Location: [facility] OR;  Service: Vascular;  Laterality: Left;   History   Social History   Marital Status: Widowed    Spouse Name: N/A    Number of Children: N/A   Years of Education: N/A   Occupational History   Not on file.   Social History Main Topics   Smoking status: Former Smoker    Types: Cigarettes    Quit date: 11/13/2010   Smokeless tobacco: Not on file   Alcohol Use: Yes     Comment: Social Drinker only/////////STOP DRINKING IN 2001   Drug Use: No   Sexual  Activity: Not on file   Other Topics Concern   Not on file   Social History Narrative   No narrative on file   Allergies  Allergen Reactions   Amoxicillin     Yeast infection   Latex Itching     Review of Systems  Genitourinary:       No bladder or bowel dysfunction.  Musculoskeletal: Positive for arthralgias (right knee pain), back pain (lower) and myalgias (mucles around right knee).       Pt denies LE weakness  Neurological: Negative for weakness (no right leg, knee weakness).       Objective:   Physical Exam  Nursing note and vitals reviewed. Constitutional: She is oriented to person, place, and time. She appears well-developed and well-nourished.  HENT:  Head: Normocephalic and atraumatic.  Pulmonary/Chest: Effort normal.  Musculoskeletal:       Right knee: She exhibits effusion. She exhibits no deformity, no laceration and no erythema. Tenderness found. No medial joint line, no lateral joint line  and no patellar tendon tenderness noted.       Right lower leg: She exhibits no swelling and no edema.  Right knee 90 degrees of flexion, full extension. 2+ effusion. Minimal warmth laterally. No patella, patellar tender, or pretibial tenderness. Slight tenderness of popliteal fossa and distal hamstring. Negative Homen's to right calf.   Neurological: She is alert and oriented to person, place, and time.  Skin: Skin is warm and dry.  Psychiatric: She has a normal mood and affect. Her behavior is normal.    Filed Vitals:   05/11/13 1424  BP: 126/72  Pulse: 62  Temp: 98.6 F (37 C)  TempSrc: Oral  Resp: 16  Height: 5\' 2"  (1.575 m)  Weight: 178 lb 6.4 oz (80.922 kg)  SpO2: 100%   [facility] reading (PRIMARY) by  Dr. Neva Seat: R knee, effusion, slight DJD/spurring and sclerotic area of distal femur appears unchanged from prior study. No fx identified.      Assessment & Plan:  [name] is a 67 y.o. female   R knee pain suspected flair of DJD vs degenerative  meniscal dz. Vs. gout flare. Complicated by prior CAD, CHF by chart, and diabetes. Options discussed, and risks of NSAIDS discussed, with understanding expressed. Can try short course of Indomethacin -ok to continue this for next few days only, and hydrocodone short course as no relief in past with tramadol.   SED. May need aspiration if not improving in 3-4 days, and possible steroid, but deferred today with hx of DM and only 3rd day of sx's. Rtc/er precautions discussed. Ok to use walker at home. Ace bandage applied.   RLBP, sciatica, hx of spinal stenosis - mild flair of pain, may be form alternating gait with knee pain.  tx as above, but plan to follow up in few days if not improving, and may need to reschedule ortho appt as planned from hospital follow up. Can use walker if needed temporarily.   I personally performed the services described in this documentation, which was scribed in my presence. The recorded information has been reviewed and considered, and addended by me as needed.  Knee pain, acute, right - Plan: DG Knee Complete 4 Views Right, HYDROcodone-acetaminophen (NORCO/VICODIN) 5-325 MG per tablet, indomethacin (INDOCIN) 25 MG capsule  History of gout - Plan: indomethacin (INDOCIN) 25 MG capsule  History of sciatica  Low back pain on right side with sciatica - Plan: HYDROcodone-acetaminophen (NORCO/VICODIN) 5-325 MG per tablet   Meds ordered this encounter  Medications   HYDROcodone-acetaminophen (NORCO/VICODIN) 5-325 MG per tablet    Sig: Take 1 tablet by mouth every 6 (six) hours as needed for pain.    Dispense:  20 tablet    Refill:  0   indomethacin (INDOCIN) 25 MG capsule    Sig: Take 1 capsule (25 mg total) by mouth 2 (two) times daily with a meal.    Dispense:  10 capsule    Refill:  0   Patient Instructions  Ok to use lower dose indomethacin short term for your right knee which may be a flair of arthritis or gout. If needed, can take hydrocodone up to every 6 hours  for more severe pain, but be careful taking his medicine as it can cause dizziness and sedation. Use the walker as needed, and if your back pain continues - follow up with orthopaedic doctor as planned from hospital.   Recheck in next 3-4 days if not improving, Return to the clinic or go to the  nearest emergency room if any of your symptoms worsen or new symptoms occur.

## 2013-05-11 NOTE — Patient Instructions (Signed)
Ok to use lower dose indomethacin short term for your right knee which may be a flair of arthritis or gout. If needed, can take hydrocodone up to every 6 hours for more severe pain, but be careful taking his medicine as it can cause dizziness and sedation. Use the walker as needed, and if your back pain continues - follow up with orthopaedic doctor as planned from hospital.   Recheck in next 3-4 days if not improving, Return to the clinic or go to the nearest emergency room if any of your symptoms worsen or new symptoms occur.

## 2013-07-06 ENCOUNTER — Ambulatory Visit: Payer: Medicare Other | Admitting: Cardiology

## 2013-08-03 ENCOUNTER — Ambulatory Visit: Payer: Medicare Other | Admitting: Cardiology

## 2013-08-30 ENCOUNTER — Telehealth: Payer: Self-pay | Admitting: Vascular Surgery

## 2013-08-30 NOTE — Telephone Encounter (Signed)
08/01/12: lvm - kf  08/03/12- lvm for pt to r.s missed appt, dpm  called work #, pt was not in ofic, sent no show letter, 01/12/13 mar  mailed discharge letter, 08/30/13 mar

## 2013-09-11 ENCOUNTER — Ambulatory Visit: Payer: Medicare Other | Admitting: Cardiology

## 2013-10-15 ENCOUNTER — Ambulatory Visit: Payer: Medicare Other | Admitting: Cardiology

## 2013-10-25 ENCOUNTER — Ambulatory Visit: Payer: Medicare Other

## 2013-10-25 ENCOUNTER — Ambulatory Visit (INDEPENDENT_AMBULATORY_CARE_PROVIDER_SITE_OTHER): Payer: Medicare Other | Admitting: Family Medicine

## 2013-10-25 VITALS — BP 122/68 | HR 68 | Temp 98.2°F | Resp 17 | Ht 64.0 in | Wt 195.0 lb

## 2013-10-25 DIAGNOSIS — M549 Dorsalgia, unspecified: Secondary | ICD-10-CM

## 2013-10-25 DIAGNOSIS — M25559 Pain in unspecified hip: Secondary | ICD-10-CM

## 2013-10-25 DIAGNOSIS — S335XXA Sprain of ligaments of lumbar spine, initial encounter: Secondary | ICD-10-CM

## 2013-10-25 MED ORDER — HYDROCODONE-ACETAMINOPHEN 5-325 MG PO TABS: 1.0000 | ORAL_TABLET | Freq: Four times a day (QID) | ORAL | Status: DC | PRN

## 2013-10-25 MED ORDER — METHOCARBAMOL 500 MG PO TABS: 500.0000 mg | ORAL_TABLET | Freq: Four times a day (QID) | ORAL | Status: DC

## 2013-10-25 NOTE — Progress Notes (Signed)
Subjective:    Patient ID: Barnie AldermanSarah F Hautala, female    DOB: August 12, 1945, 68 y.o.   MRN: [id number]  10/25/2013  Back Pain   HPI This 68 y.o. female presents for evaluation of low back pain.   Started hurting in groin region.  Pain with bending over to put on socks.  Lifting hip L causes pain.  Pain mostly on L side.  Severe L lower back pain and radiates into L groin.  Movement makes worse.  Bending makes worse. Turning over in bed makes worse.No radiation into legs.  No n/t.  History of gout in B feet.  Normal b/b function.  When sitting on toilet, pain on L lower side; must lean away from pain on toilet.  Taking Tylenol ES with some relief but for past 1.5 weeks, no relief with Tylenol.  No similar symptoms in the past.  History of sciatica; had pain radiating into leg; this is different.  Pain with walking.  Pain with laying on L hip.  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  PCP: [facility] left; no PCP.    Review of Systems  Past Medical History  Diagnosis Date  . Atypical face pain   . Hypertension   . CAD (coronary artery disease)     Catheterization 2006, mild two-vessel nonobstructive disease  . GERD (gastroesophageal reflux disease)   . Headache(784.0)   . Hyperlipidemia   . Arthritis   . Sleep apnea     CPAP compliance ???  . Diabetes mellitus   . Shortness of breath   . PAD (peripheral artery disease)     Dr. Darrick PennaFields,  Bilateral superficial femoral artery occlusions with one vessel runoff via the peroneal artery  . Ejection fraction     EF 60%, echo, October, 2010,  . Urinary incontinence   . Overweight   . Mitral regurgitation     Mild, echo, October, 2010  . Fluid overload   . Foot pain, right     November, 2012  . Stroke   . UTI (lower urinary tract infection)    Allergies  Allergen Reactions  . Amoxicillin     Yeast infection  . Latex Itching   Current Outpatient Prescriptions  Medication Sig Dispense Refill  . aspirin 325 MG tablet Take 325 mg by mouth  daily.       . citalopram (CELEXA) 20 MG tablet Take 20 mg by mouth daily.       . nitroGLYCERIN (NITROSTAT) 0.4 MG SL tablet Place 1 tablet (0.4 mg total) under the tongue every 5 (five) minutes as needed. For chest pain  25 tablet  11  . zolpidem (AMBIEN) 10 MG tablet Take 10 mg by mouth at bedtime as needed. For sleep      . HYDROcodone-acetaminophen (NORCO/VICODIN) 5-325 MG per tablet Take 1 tablet by mouth every 6 (six) hours as needed.  30 tablet  0  . methocarbamol (ROBAXIN) 500 MG tablet Take 1 tablet (500 mg total) by mouth 4 (four) times daily.  40 tablet  0   No current facility-administered medications for this visit.   History   Social History  . Marital Status: Widowed    Spouse Name: N/A    Number of Children: N/A  . Years of Education: N/A   Occupational History  . Not on file.   Social History Main Topics  . Smoking status: Former Smoker    Types: Cigarettes    Quit date: 11/13/2010  . Smokeless tobacco: Not on  file  . Alcohol Use: Yes     Comment: Social Drinker only/////////STOP DRINKING IN 2001  . Drug Use: No  . Sexual Activity: Not on file   Other Topics Concern  . Not on file   Social History Narrative  . No narrative on file       Objective:    BP 122/68  Pulse 68  Temp(Src) 98.2 F (36.8 C) (Oral)  Resp 17  Ht 5\' 4"  (1.626 m)  Wt 195 lb (88.451 kg)  BMI 33.46 kg/m2  SpO2 98% Physical Exam  Nursing note and vitals reviewed. Constitutional: She is oriented to person, place, and time. She appears well-developed and well-nourished. No distress.  HENT:  Head: Normocephalic and atraumatic.  Cardiovascular: Normal rate, regular rhythm and normal heart sounds.   No murmur heard. Pulmonary/Chest: Effort normal and breath sounds normal.  Abdominal: Soft. Bowel sounds are normal. She exhibits no distension and no mass. There is no tenderness. There is no rebound and no guarding.  Musculoskeletal:       Left hip: She exhibits normal range of  motion, normal strength, no tenderness, no bony tenderness and no swelling.       Lumbar back: She exhibits decreased range of motion, tenderness, pain and spasm. She exhibits no bony tenderness and no swelling.  Lumbar spine:  +TTP L paraspinal region; no midline TTP; straight leg raise +; pain with flexion<extension; milder pain with lateral bending and rotating side to side.  Motor 5/5 BLE.  Marching intact with minimal pain. L hip:  Non-tender to palpation; minimal pain with external and internal rotation; motor 5/5.  Neurological: She is alert and oriented to person, place, and time. She has normal strength. No sensory deficit.  Skin: She is not diaphoretic.    [facility] reading (PRIMARY) by  Dr. Katrinka Blazing. L HIP:  NAD; LS SPINE:  Spurring diffuse levels; degenerative changes L5-S1.        Assessment & Plan:  Back pain - Plan: DG Hip Complete Left, DG Lumbar Spine Complete  Hip pain - Plan: DG Hip Complete Left, DG Lumbar Spine Complete  Lumbar sprain  1. Low back pain/lumbar sprain:  New.  With radicular pain to L groin. Avoid NSAIDs due to CAD; pt declined rx for Prednisone and agreeable due to history of DMII.  Rx for Robaxin provided to use qid; rx for Hydrocodone to use qhs.  Home exercise program provided; pt declined referral for PT at this time. 2. L hip pain: New. Associated with lumbar pain on L; treat with Robaxin, Hydrocodone, home exercise program.  Meds ordered this encounter  Medications  . methocarbamol (ROBAXIN) 500 MG tablet    Sig: Take 1 tablet (500 mg total) by mouth 4 (four) times daily.    Dispense:  40 tablet    Refill:  0  . HYDROcodone-acetaminophen (NORCO/VICODIN) 5-325 MG per tablet    Sig: Take 1 tablet by mouth every 6 (six) hours as needed.    Dispense:  30 tablet    Refill:  0    No Follow-up on file.   Nilda Simmer, M.D.  [facility] [address] [place], [place]  [phone] phone [phone]  fax

## 2013-10-25 NOTE — Patient Instructions (Signed)
Piriformis Syndrome with Rehab Piriformis syndrome is a condition the affects the nervous system in the area of the hip, and is characterized by pain and possibly a loss of feeling in the backside (posterior) thigh that may extend down the entire length of the leg. The symptoms are caused by an increase in pressure on the sciatic nerve by the piriformis muscle, which is on the back of the hip and is responsible for externally rotating the hip. The sciatic nerve and its branches connect to much of the leg. Normally the sciatic nerve runs between the piriformis muscle and other muscles. However, in certain individuals the nerve runs through the muscle, which causes an increase in pressure on the nerve and results in the symptoms of piriformis syndrome. SYMPTOMS   Pain, tingling, numbness, or burning in the back of the thigh that may also extend down the entire leg.  Occasionally, tenderness in the buttock.  Loss of function of the leg.  Pain that worsens when using the piriformis muscle (running, jumping, or stairs).  Pain that increases with prolonged sitting.  Pain that is lessened by laying flat on the back. CAUSES   Piriformis syndrome is the result of an increase in pressure placed on the sciatic nerve. Often times piriformis syndrome is an overuse injury.  Stress placed on the nerve from a sudden increase in the intensity, frequency, or duration of training.  Compensation of other extremity injuries. RISK INCREASES WITH:  Sports that involve the piriformis muscle (running, walking or jumping).  You are born with (congenital) a defect in which the sciatic nerve passes through the muscle. PREVENTION  Warm up and stretch properly before activity.  Allow for adequate recovery between workouts.  Maintain physical fitness:  Strength, flexibility, and endurance.  Cardiovascular fitness. PROGNOSIS  If treated properly, then the symptoms of piriformis syndrome usually resolve in 2  to 6 weeks. RELATED COMPLICATIONS   Persistent and possibly permanent pain and numbness in the lower extremity.  Weakness of the extremity that may progress to disability and inability to compete. TREATMENT  The most effective treatment for piriformis syndrome is rest from any activities that aggravate the symptoms. Ice and pain medication may help reduce pain and inflammation. The use of strengthening and stretching exercises may help reduce pain with activity. These exercises may be performed at home or with a therapist. A referral to a therapist may be given for further evaluation and treatment, such as ultrasound. Corticosteroid injections may be given to reduce inflammation that is causing pressure to be placed on the sciatic nerve. If non-surgical (conservative) treatment is unsuccessful, then surgery may be recommended.  MEDICATION   If pain medication is necessary, then nonsteroidal anti-inflammatory medications, such as aspirin and ibuprofen, or other minor pain relievers, such as acetaminophen, are often recommended.  Do not take pain medication for 7 days before surgery.  Prescription pain relievers may be given if deemed necessary by your caregiver. Use only as directed and only as much as you need.  Corticosteroid injections may be given by your caregiver. These injections should be reserved for the most serious cases, because they may only be given a certain number of times. HEAT AND COLD:   Cold treatment (icing) relieves pain and reduces inflammation. Cold treatment should be applied for 10 to 15 minutes every 2 to 3 hours for inflammation and pain and immediately after any activity that aggravates your symptoms. Use ice packs or massage the area with a piece of ice (ice massage).    Heat treatment may be used prior to performing the stretching and strengthening activities prescribed by your caregiver, physical therapist, or athletic trainer. Use a heat pack or soak the injury in  warm water. SEEK IMMEDIATE MEDICAL CARE IF:  Treatment seems to offer no benefit, or the condition worsens.  Any medications produce adverse side effects. EXERCISES RANGE OF MOTION (ROM) AND STRETCHING EXERCISES - Piriformis Syndrome These exercises may help you when beginning to rehabilitate your injury. Your symptoms may resolve with or without further involvement from your physician, physical therapist or athletic trainer. While completing these exercises, remember:   Restoring tissue flexibility helps normal motion to return to the joints. This allows healthier, less painful movement and activity.  An effective stretch should be held for at least 30 seconds.  A stretch should never be painful. You should only feel a gentle lengthening or release in the stretched tissue. STRETCH - Hip Rotators  Lie on your back on a firm surface. Grasp your right / left knee with your right / left hand and your ankle with your opposite hand.  Keeping your hips and shoulders firmly planted, gently pull your right / left knee and rotate your lower leg toward your opposite shoulder until you feel a stretch in your buttocks.  Hold this stretch for __________ seconds. Repeat this stretch __________ times. Complete this stretch __________ times per day. STRETCH  Iliotibial Band  On the floor or bed, lie on your side so your right / left leg is on top. Bend your knee and grab your ankle.  Slowly bring your knee back so that your thigh is in line with your trunk. Keep your heel at your buttocks and gently arch your back so your head, shoulders and hips line up.  Slowly lower your leg so that your knee approaches the floor/bed until you feel a gentle stretch on the outside of your right / left thigh. If you do not feel a stretch and your knee will not fall farther, place the heel of your opposite foot on top of your knee and pull your thigh down farther.  Hold this stretch for __________ seconds. Repeat  __________ times. Complete __________ times per day. STRENGTHENING EXERCISES - Piriformis Syndrome  These are some of the caregiver again or until your symptoms are resolved. Remember:   Strong muscles with good endurance tolerate stress better.  Do the exercises as initially prescribed by your caregiver. Progress slowly with each exercise, gradually increasing the number of repetitions and weight used under their guidance. STRENGTH - Hip Abductors, Straight Leg Raises Be aware of your form throughout the entire exercise so that you exercise the correct muscles. Sloppy form means that you are not strengthening the correct muscles.  Lie on your side so that your head, shoulders, knee and hip line up. You may bend your lower knee to help maintain your balance. Your right / left leg should be on top.  Roll your hips slightly forward, so that your hips are stacked directly over each other and your right / left knee is facing forward.  Lift your top leg up 4-6 inches, leading with your heel. Be sure that your foot does not drift forward or that your knee does not roll toward the ceiling.  Hold this position for __________ seconds. You should feel the muscles in your outer hip lifting (you may not notice this until your leg begins to tire).  Slowly lower your leg to the starting position. Allow the muscles to   fully relax before beginning the next repetition. Repeat __________ times. Complete this exercise __________ times per day.  STRENGTH - Hip Abductors, Quadriped  On a firm, lightly padded surface, position yourself on your hands and knees. Your hands should be directly below your shoulders and your knees should be directly below your hips.  Keeping your right / left knee bent, lift your leg out to the side. Keep your legs level and in line with your shoulders.  Position yourself on your hands and knees.  Hold for __________ seconds.  Keeping your trunk steady and your hips level, slowly  lower your leg to the starting position. Repeat __________ times. Complete this exercise __________ times per day.  STRENGTH - Hip Abductors, Standing  Tie one end of a rubber exercise band/tubing to a secure surface (table, pole) and tie a loop at the other end.  Place the loop around your right / left ankle. Keeping your ankle with the band directly opposite of the secured end, step away until there is tension in the tube/band.  Hold onto a chair as needed for balance.  Keeping your back upright, your shoulders over your hips, and your toes pointing forward, lift your right / left leg out to your side. Be sure to lift your leg with your hip muscles. Do not "throw" your leg or tip your body to lift your leg.  Slowly and with control, return to the starting position. Repeat exercise __________ times. Complete this exercise __________ times per day.  Document Released: 07/12/2005 Document Revised: 01/11/2012 Document Reviewed: 10/24/2008 [employer] Patient Information 2014 [employer].  

## 2013-11-02 ENCOUNTER — Encounter: Payer: Self-pay | Admitting: Cardiology

## 2013-11-12 LAB — HEPATIC FUNCTION PANEL
ALT: 10 U/L (ref 7–35)
AST: 15 U/L (ref 13–35)
Bilirubin, Total: 0.5 mg/dL

## 2013-11-12 LAB — BASIC METABOLIC PANEL
BUN: 24 mg/dL — AB (ref 4–21)
Creatinine: 1.2 mg/dL — AB (ref 0.5–1.1)
Glucose: 102 mg/dL
Potassium: 4.2 mmol/L (ref 3.4–5.3)
SODIUM: 139 mmol/L (ref 137–147)

## 2013-11-16 ENCOUNTER — Encounter: Payer: Self-pay | Admitting: Cardiology

## 2014-02-27 ENCOUNTER — Encounter: Payer: Self-pay | Admitting: Cardiology

## 2014-02-27 ENCOUNTER — Ambulatory Visit (INDEPENDENT_AMBULATORY_CARE_PROVIDER_SITE_OTHER): Payer: Medicare Other | Admitting: Cardiology

## 2014-02-27 VITALS — BP 130/70 | HR 60 | Ht 62.5 in | Wt 193.0 lb

## 2014-02-27 DIAGNOSIS — I10 Essential (primary) hypertension: Secondary | ICD-10-CM

## 2014-02-27 DIAGNOSIS — E785 Hyperlipidemia, unspecified: Secondary | ICD-10-CM

## 2014-02-27 DIAGNOSIS — I2510 Atherosclerotic heart disease of native coronary artery without angina pectoris: Secondary | ICD-10-CM

## 2014-02-27 MED ORDER — ATORVASTATIN CALCIUM 40 MG PO TABS: 40.0000 mg | ORAL_TABLET | Freq: Every day | ORAL | Status: DC

## 2014-02-27 MED ORDER — ASPIRIN EC 81 MG PO TBEC: 81.0000 mg | DELAYED_RELEASE_TABLET | Freq: Every day | ORAL | Status: AC

## 2014-02-27 NOTE — Assessment & Plan Note (Signed)
I recommended increasing her atorvastatin from 20-40 mg daily per guidelines.

## 2014-02-27 NOTE — Assessment & Plan Note (Signed)
She has mild coronary disease. I've recommended that we increase her atorvastatin 240 mg daily based on guidelines. I've also recommended that her aspirin be lowered to 81 mg daily.

## 2014-02-27 NOTE — Patient Instructions (Signed)
**Note De-Identified Ashana Tullo Obfuscation** Your physician has recommended you make the following change in your medication: decrease Aspirin to 81 mg daily and increase Atorvastatin to 40 mg daily  Your physician wants you to follow-up in: 1 year. You will receive a reminder letter in the mail two months in advance. If you don't receive a letter, please call our office to schedule the follow-up appointment.

## 2014-02-27 NOTE — Assessment & Plan Note (Signed)
Blood pressure stable. No change in therapy. 

## 2014-02-27 NOTE — Progress Notes (Signed)
Patient ID: [name], female   DOB: [birth year], 68 y.o.   MRN: [id number]    HPI  Patient is seen to follow up a history of mild coronary artery disease. She has good LV function. Catheterization showed mild disease in the past. She's feeling well. I saw her last June, 2014. Stress has decreased in her life. She is not having any significant chest pain.  Allergies  Allergen Reactions  . Amoxicillin     Yeast infection  . Latex Itching    Current Outpatient Prescriptions  Medication Sig Dispense Refill  . acetaminophen-codeine (TYLENOL #3) 300-30 MG per tablet 1 tablet every 6 (six) hours as needed.       Marland Kitchen allopurinol (ZYLOPRIM) 300 MG tablet 300 mg daily.       Marland Kitchen aspirin 325 MG tablet Take 325 mg by mouth daily.       Marland Kitchen atorvastatin (LIPITOR) 20 MG tablet 20 mg daily.       . citalopram (CELEXA) 20 MG tablet Take 20 mg by mouth daily.       . hydrochlorothiazide (HYDRODIURIL) 25 MG tablet 25 mg daily.       . metFORMIN (GLUCOPHAGE) 1000 MG tablet 1,000 mg 2 (two) times daily with a meal.       . metoprolol (LOPRESSOR) 100 MG tablet 100 mg daily.       . nitroGLYCERIN (NITROSTAT) 0.4 MG SL tablet Place 1 tablet (0.4 mg total) under the tongue every 5 (five) minutes as needed. For chest pain  25 tablet  11   No current facility-administered medications for this visit.    History   Social History  . Marital Status: Widowed    Spouse Name: N/A    Number of Children: N/A  . Years of Education: N/A   Occupational History  . Not on file.   Social History Main Topics  . Smoking status: Former Smoker    Types: Cigarettes    Quit date: 11/13/2010  . Smokeless tobacco: Not on file  . Alcohol Use: Yes     Comment: Social Drinker only/////////STOP DRINKING IN 2001  . Drug Use: No  . Sexual Activity: Not on file   Other Topics Concern  . Not on file   Social History Narrative  . No narrative on file    Family History  Problem Relation Age of Onset  . Diabetes Mother    . Hyperlipidemia Mother   . Hypertension Mother   . Heart disease Father   . Diabetes Sister   . Hypertension Sister   . Hyperlipidemia Sister   . Anesthesia problems Neg Hx     Past Medical History  Diagnosis Date  . Atypical face pain   . Hypertension   . CAD (coronary artery disease)     Catheterization 2006, mild two-vessel nonobstructive disease  . GERD (gastroesophageal reflux disease)   . Headache(784.0)   . Hyperlipidemia   . Arthritis   . Sleep apnea     CPAP compliance ???  . Diabetes mellitus   . Shortness of breath   . PAD (peripheral artery disease)     Dr. Darrick Penna,  Bilateral superficial femoral artery occlusions with one vessel runoff via the peroneal artery  . Ejection fraction     EF 60%, echo, October, 2010,  . Urinary incontinence   . Overweight(278.02)   . Mitral regurgitation     Mild, echo, October, 2010  . Fluid overload   . Foot pain, right  November, 2012  . Stroke   . UTI (lower urinary tract infection)     Past Surgical History  Procedure Laterality Date  . Cesarean section    . Abdominal hysterectomy    . Eye surgery  12/11/10  . Vesicovaginal fistula closure w/ tah    . Artery biopsy  12/27/2011    Procedure: BIOPSY TEMPORAL ARTERY;  Surgeon: Sherren Kernsharles E Fields, MD;  [facility];  Service: Vascular;  Laterality: Left;    Patient Active Problem List   Diagnosis Date Noted  . Spinal stenosis of lumbar region 03/20/2013  . Acute on chronic kidney failure 03/20/2013  . Diastolic CHF 03/20/2013  . Gout 03/20/2013  . Anemia of chronic disease 03/20/2013  . Trochanteric bursitis 03/19/2013  . Lumbar spondylosis with myelopathy 02/27/2013  . Giant cell arteritis 12/23/2011  . Foot pain, right   . CAD (coronary artery disease)   . GERD (gastroesophageal reflux disease)   . Sleep apnea   . PAD (peripheral artery disease)   . Ejection fraction   . Mitral regurgitation   . FLUID OVERLOAD 06/04/2009  . HYPOKALEMIA 06/04/2009  .  SHORTNESS OF BREATH 05/07/2009  . DM 05/06/2009  . DYSLIPIDEMIA 05/06/2009  . OVERWEIGHT 05/06/2009  . HYPERTENSION 05/06/2009    ROS   Patient denies fever, chills, headache, sweats, rash, change in vision, change in hearing, chest pain, cough, nausea vomiting, urinary symptoms. All other systems are reviewed and are negative.  PHYSICAL EXAM  Patient is oriented to person time and place. Affect is normal. She is overweight. Head is atraumatic. Sclera and conjunctiva are normal. There is no jugulovenous distention. Lungs are clear. Respiratory effort is nonlabored. Cardiac exam reveals S1 and S2. Abdomen is soft. There is no peripheral edema. There are no musculoskeletal deformities. There are no skin rashes.  Filed Vitals:   02/27/14 1351  BP: 130/70  Pulse: 60  Height: 5' 2.5" (1.588 m)  Weight: 193 lb (87.544 kg)     ASSESSMENT & PLAN

## 2014-04-26 ENCOUNTER — Other Ambulatory Visit: Payer: Self-pay | Admitting: Cardiology

## 2014-05-30 ENCOUNTER — Other Ambulatory Visit (HOSPITAL_COMMUNITY): Payer: Self-pay | Admitting: Family Medicine

## 2014-05-30 DIAGNOSIS — Z1231 Encounter for screening mammogram for malignant neoplasm of breast: Secondary | ICD-10-CM

## 2014-06-10 ENCOUNTER — Ambulatory Visit (HOSPITAL_COMMUNITY)
Admission: RE | Admit: 2014-06-10 | Discharge: 2014-06-10 | Disposition: A | Discharge status: Home / Self Care | Payer: Medicare Other | Source: Ambulatory Visit | Attending: Family Medicine | Admitting: Family Medicine

## 2014-06-10 DIAGNOSIS — Z1231 Encounter for screening mammogram for malignant neoplasm of breast: Secondary | ICD-10-CM | POA: Diagnosis present

## 2014-09-23 LAB — CBC AND DIFFERENTIAL
HEMATOCRIT: 35 % — AB (ref 36–46)
Hemoglobin: 10.6 g/dL — AB (ref 12.0–16.0)
Platelets: 273 10*3/uL (ref 150–399)
WBC: 6.8 10*3/mL

## 2014-09-23 LAB — HEPATIC FUNCTION PANEL
ALT: 13 U/L (ref 7–35)
AST: 17 U/L (ref 13–35)
Bilirubin, Total: 0.6 mg/dL

## 2014-09-23 LAB — BASIC METABOLIC PANEL
BUN: 19 mg/dL (ref 4–21)
Creatinine: 1.1 mg/dL (ref 0.5–1.1)
GLUCOSE: 86 mg/dL
Potassium: 4 mmol/L (ref 3.4–5.3)
Sodium: 142 mmol/L (ref 137–147)

## 2014-09-23 LAB — HEMOGLOBIN A1C: Hgb A1c MFr Bld: 6.2 % — AB (ref 4.0–6.0)

## 2014-09-23 LAB — TSH: TSH: 1.14 u[IU]/mL (ref 0.41–5.90)

## 2014-09-23 LAB — POCT ERYTHROCYTE SEDIMENTATION RATE, NON-AUTOMATED: SED RATE: 74 mm

## 2014-09-26 ENCOUNTER — Ambulatory Visit: Payer: Self-pay | Admitting: Endocrinology

## 2014-10-07 ENCOUNTER — Ambulatory Visit: Payer: Self-pay | Admitting: Endocrinology

## 2014-12-11 ENCOUNTER — Encounter: Payer: Self-pay | Admitting: Endocrinology

## 2014-12-11 ENCOUNTER — Other Ambulatory Visit: Payer: Self-pay | Admitting: *Deleted

## 2014-12-11 ENCOUNTER — Ambulatory Visit (INDEPENDENT_AMBULATORY_CARE_PROVIDER_SITE_OTHER): Payer: Medicare Other | Admitting: Endocrinology

## 2014-12-11 VITALS — BP 126/76 | HR 89 | Temp 97.8°F | Resp 16 | Ht 63.5 in | Wt 197.0 lb

## 2014-12-11 DIAGNOSIS — E213 Hyperparathyroidism, unspecified: Secondary | ICD-10-CM

## 2014-12-11 DIAGNOSIS — E559 Vitamin D deficiency, unspecified: Secondary | ICD-10-CM

## 2014-12-11 DIAGNOSIS — I10 Essential (primary) hypertension: Secondary | ICD-10-CM | POA: Insufficient documentation

## 2014-12-11 DIAGNOSIS — E876 Hypokalemia: Secondary | ICD-10-CM

## 2014-12-11 LAB — BASIC METABOLIC PANEL
BUN: 16 mg/dL (ref 6–23)
CALCIUM: 9.8 mg/dL (ref 8.4–10.5)
CO2: 28 mEq/L (ref 19–32)
Chloride: 101 mEq/L (ref 96–112)
Creatinine, Ser: 0.99 mg/dL (ref 0.40–1.20)
GFR: 71.16 mL/min (ref >60.00)
GLUCOSE: 111 mg/dL — AB (ref 70–99)
Potassium: 3.3 mEq/L — ABNORMAL LOW (ref 3.5–5.1)
SODIUM: 137 meq/L (ref 135–145)

## 2014-12-11 LAB — VITAMIN D 25 HYDROXY (VIT D DEFICIENCY, FRACTURES): VITD: 8.23 ng/mL — ABNORMAL LOW (ref 30.00–100.00)

## 2014-12-11 NOTE — Progress Notes (Signed)
Quick Note:  Please let patient know that the potassium is low at 3.3, needs to ask PCP to change HCTZ to 12.5 mg and increase high potassium foods Vitamin D is very low, needs to start OTC vitamin D 5000 units daily, send copies to PCP Has one more test pending  ______

## 2014-12-11 NOTE — Progress Notes (Signed)
Patient ID: [name], female   DOB: 1945-12-04, 69 y.o.   MRN: [id number]    Chief complaint: High PTH level   Referring physician: Andrey CampanileWilson  History of Present Illness:  It is somewhat unclear why the patient is referred here today. Her records are somewhat incomplete and on review of several pieces of information it appears that [name] had a previous PCP who had checked a PTH level in 9/14 possibly because of a high normal calcium of 10.2 PTH level from the in-house lab was 167 done on 04/09/2013  No reference to hypercalcemia has been made in her chart The patient thinks [name] is referred here for thyroid problems but there is no abnormality of thyroid seen on her previous labs are clinical notes  The patient has had calcium levels checked since her previous level of 10.2 and these are as follows: Calcium in 4/15 was 9.4 and in 2/16 was 9.5 Historical values:  Lab Results  Component Value Date   CALCIUM 9.4 03/22/2013   CALCIUM 9.8 03/21/2013   CALCIUM 9.0 03/19/2013   CALCIUM 9.7 03/18/2013   CALCIUM 9.5 02/10/2013   CALCIUM 9.8 12/27/2011   CALCIUM 9.4 01/22/2011   CALCIUM 9.1 10/08/2010   CALCIUM 8.8 10/27/2009    The patient apparently has been on HCTZ 25 mg for the last few years, he does not know when this was started. [name] has no history of pathological fractures. [name] had kidney stone in 2002 but none since then.  No history of sarcoidosis, known carcinoma, thyroid disease or renal failure Her last serum creatinine was 1.1    25 (OH) Vitamin D level has not been checked   [name] has not had any excessive amount of dairy.  Start Tums in her diet.  [name] does not take any calcium or vitamin D supplements      Medication List       This list is accurate as of: 12/11/14  3:03 PM.  Always use your most recent med list.               acetaminophen-codeine 300-30 MG per tablet  Commonly known as:  TYLENOL #3  1 tablet every 6 (six) hours as needed.     allopurinol  300 MG tablet  Commonly known as:  ZYLOPRIM  300 mg daily.     aspirin EC 81 MG tablet  Take 1 tablet (81 mg total) by mouth daily.     atorvastatin 40 MG tablet  Commonly known as:  LIPITOR  Take 1 tablet (40 mg total) by mouth daily.     citalopram 20 MG tablet  Commonly known as:  CELEXA  Take 20 mg by mouth daily.     hydrochlorothiazide 25 MG tablet  Commonly known as:  HYDRODIURIL  25 mg daily.     HYDROcodone-acetaminophen 5-325 MG per tablet  Commonly known as:  NORCO/VICODIN     metFORMIN 1000 MG tablet  Commonly known as:  GLUCOPHAGE  1,000 mg 2 (two) times daily with a meal.     metoprolol 100 MG tablet  Commonly known as:  LOPRESSOR  100 mg daily.     NITROSTAT 0.4 MG SL tablet  Generic drug:  nitroGLYCERIN  DISSOLVE ONE TABLET UNDER TONGUE AS NEEDED FOR CHEST PAIN EVERY 5 MINUTES     traZODone 100 MG tablet  Commonly known as:  DESYREL        Allergies:  Allergies  Allergen Reactions  . Amoxicillin  Yeast infection  . Latex Itching    Past Medical History  Diagnosis Date  . Atypical face pain   . Hypertension   . CAD (coronary artery disease)     Catheterization 2006, mild two-vessel nonobstructive disease  . GERD (gastroesophageal reflux disease)   . Headache(784.0)   . Hyperlipidemia   . Arthritis   . Sleep apnea     CPAP compliance ???  . Diabetes mellitus   . Shortness of breath   . PAD (peripheral artery disease)     Dr. Darrick Penna,  Bilateral superficial femoral artery occlusions with one vessel runoff via the peroneal artery  . Ejection fraction     EF 60%, echo, October, 2010,  . Urinary incontinence   . Overweight(278.02)   . Mitral regurgitation     Mild, echo, October, 2010  . Fluid overload   . Foot pain, right     November, 2012  . Stroke   . UTI (lower urinary tract infection)     Past Surgical History  Procedure Laterality Date  . Cesarean section    . Abdominal hysterectomy    . Eye surgery  12/11/10  .  Vesicovaginal fistula closure w/ tah    . Artery biopsy  12/27/2011    Procedure: BIOPSY TEMPORAL ARTERY;  Surgeon: Sherren Kerns, MD;  Location: [facility];  Service: Vascular;  Laterality: Left;    Family History  Problem Relation Age of Onset  . Diabetes Mother   . Hyperlipidemia Mother   . Hypertension Mother   . Heart disease Father   . Diabetes Sister   . Hypertension Sister   . Hyperlipidemia Sister   . Anesthesia problems Neg Hx     Social History:  reports that [name] quit smoking about 4 years ago. Her smoking use included Cigarettes. [name] does not have any smokeless tobacco history on file. [name] reports that [name] drinks alcohol. [name] reports that [name] does not use illicit drugs.  Review of Systems  Constitutional: Negative for weight loss.       Previous weight range 179-210, recently more  Respiratory: Negative for shortness of breath.   Cardiovascular: Negative for leg swelling.  Gastrointestinal: Positive for constipation. Negative for heartburn.  Musculoskeletal: Positive for joint pain and neck pain.  Neurological:       Feet numb at times    [name] has been told to have diabetes for 5 years.  Does not check her blood sugar Her last A1c was 6.2 and glucose 86 fasting  [name] has had recurrent gout.  Her last uric acid was 8.1 and [name] thinks [name] is taking her allopurinol regularly [name] does not think [name] has taken any colchicine for the gout and currently is having pain in her right big toe and difficulty walking Has been told not to take any ibuprofen   EXAM:  BP 126/76 mmHg  Pulse 89  Temp(Src) 97.8 F (36.6 C)  Resp 16  Ht 5' 3.5" (1.613 m)  Wt 197 lb (89.359 kg)  BMI 34.35 kg/m2  SpO2 96%  GENERAL: [name] has moderate generalized obesity  No pallor, clubbing, lymphadenopathy or edema.   Skin:  no rash or pigmentation.  EYES:  Externally normal.    ENT: Oral mucosa and tongue normal.  THYROID:  Not palpable.  HEART:  Normal  S1 and S2; no murmur or  click.  CHEST:  Normal shape Lungs:   Vescicular breath sounds heard equally.  No crepitations/ wheeze.  ABDOMEN:  No distention.  Liver and spleen not palpable.  No other mass or tenderness.  NEUROLOGICAL: .Reflexes are normal bilaterally at biceps.  SPINE AND JOINTS:  [name] has swelling of the base of the first metatarsal on the right with tenderness, mild erythema on the plantar surface   ASSESSMENT/Plan:   ?  Hyperparathyroidism:   [name] has not had any hypercalcemia and has only one high normal calcium and 2014 of 10.2 All the subsequent calcium levels have been quite normal [name] had a PTH level done from unknown lab in 2014 which was high but no follow-up done Not clear if [name] has primary or secondary hyperparathyroidism  Discussed with the patient that in the absence of hypercalcemia not clear if [name] has hyperparathyroidism and will also need to be evaluated for vitamin D deficiency Discussed the nature of primary hyperparathyroidism as well as normal role of the parathyroid glands. Discussed potential  effects of hyperparathyroidism long-term on bone health, kidney stones and kidney function Explained to patient that surgery is indicated only there are symptoms of high calcium, a level over 1 point above the normal range or known osteoporosis.  However currently the patient is on 25 mg of HCTZ and this may be playing a role in her occasional high normal calcium levels  GOUT: [name] is having recurrent symptoms despite taking allopurinol, has not been offered Uloric Most likely [name] has difficulty with hyperuricemia because of continuing large doses of HCTZ which needs to be reduced to 12.5 mg Also discussed that [name] needs to be using colchicine to relieve acute attacks and [name] will contact PCP for this  Labs ordered today: BMP, vitamin D level and PTH level    Okey Zelek 12/11/2014, 3:03 PM   Labs show that the potassium is low at 3.3, needs to ask PCP to change HCTZ to 12.5 mg and  increase high potassium foods  Vitamin D is very low, needs to start OTC vitamin D 5000 units daily, send copies to PCP Serum calcium normal and does not need any further evaluation  Office Visit on 12/11/2014  Component Date Value Ref Range Status  . Sodium 12/11/2014 137  135 - 145 mEq/L Final  . Potassium 12/11/2014 3.3* 3.5 - 5.1 mEq/L Final  . Chloride 12/11/2014 101  96 - 112 mEq/L Final  . CO2 12/11/2014 28  19 - 32 mEq/L Final  . Glucose, Bld 12/11/2014 111* 70 - 99 mg/dL Final  . BUN 40/98/119105/18/2016 16  6 - 23 mg/dL Final  . Creatinine, Ser 12/11/2014 0.99  0.40 - 1.20 mg/dL Final  . Calcium 47/82/956205/18/2016 9.8  8.4 - 10.5 mg/dL Final  . GFR 13/08/657805/18/2016 71.16  >60.00 mL/min Final  . VITD 12/11/2014 8.23* 30.00 - 100.00 ng/mL Final

## 2014-12-11 NOTE — Patient Instructions (Addendum)
Try colchicine for gout  Suggest 12.5mg  of HCTZ

## 2014-12-12 LAB — PARATHYROID HORMONE, INTACT (NO CA): PTH: 57 pg/mL (ref 15–65)

## 2014-12-13 ENCOUNTER — Encounter: Payer: Self-pay | Admitting: Endocrinology

## 2015-02-24 HISTORY — PX: SHOULDER ARTHROSCOPY: SHX128

## 2015-03-09 ENCOUNTER — Other Ambulatory Visit: Payer: Self-pay | Admitting: Cardiology

## 2015-03-10 ENCOUNTER — Other Ambulatory Visit: Payer: Self-pay | Admitting: Cardiology

## 2015-04-21 ENCOUNTER — Emergency Department (HOSPITAL_COMMUNITY): Payer: Medicare Other

## 2015-04-21 ENCOUNTER — Inpatient Hospital Stay (HOSPITAL_COMMUNITY)
Admission: EM | Admit: 2015-04-21 | Discharge: 2015-04-23 | DRG: 287 | Disposition: A | Discharge status: Home / Self Care | Payer: Medicare Other | Attending: Internal Medicine | Admitting: Internal Medicine

## 2015-04-21 ENCOUNTER — Encounter (HOSPITAL_COMMUNITY): Payer: Self-pay

## 2015-04-21 DIAGNOSIS — Z91040 Latex allergy status: Secondary | ICD-10-CM | POA: Diagnosis not present

## 2015-04-21 DIAGNOSIS — I739 Peripheral vascular disease, unspecified: Secondary | ICD-10-CM

## 2015-04-21 DIAGNOSIS — Z6834 Body mass index (BMI) 34.0-34.9, adult: Secondary | ICD-10-CM | POA: Diagnosis not present

## 2015-04-21 DIAGNOSIS — E663 Overweight: Secondary | ICD-10-CM

## 2015-04-21 DIAGNOSIS — M109 Gout, unspecified: Secondary | ICD-10-CM | POA: Diagnosis present

## 2015-04-21 DIAGNOSIS — R072 Precordial pain: Secondary | ICD-10-CM | POA: Diagnosis not present

## 2015-04-21 DIAGNOSIS — N183 Chronic kidney disease, stage 3 unspecified: Secondary | ICD-10-CM

## 2015-04-21 DIAGNOSIS — R001 Bradycardia, unspecified: Secondary | ICD-10-CM | POA: Diagnosis present

## 2015-04-21 DIAGNOSIS — N189 Chronic kidney disease, unspecified: Secondary | ICD-10-CM

## 2015-04-21 DIAGNOSIS — E785 Hyperlipidemia, unspecified: Secondary | ICD-10-CM

## 2015-04-21 DIAGNOSIS — K219 Gastro-esophageal reflux disease without esophagitis: Secondary | ICD-10-CM | POA: Diagnosis present

## 2015-04-21 DIAGNOSIS — R0602 Shortness of breath: Secondary | ICD-10-CM

## 2015-04-21 DIAGNOSIS — E119 Type 2 diabetes mellitus without complications: Secondary | ICD-10-CM

## 2015-04-21 DIAGNOSIS — I340 Nonrheumatic mitral (valve) insufficiency: Secondary | ICD-10-CM

## 2015-04-21 DIAGNOSIS — Z8673 Personal history of transient ischemic attack (TIA), and cerebral infarction without residual deficits: Secondary | ICD-10-CM

## 2015-04-21 DIAGNOSIS — E876 Hypokalemia: Secondary | ICD-10-CM | POA: Diagnosis present

## 2015-04-21 DIAGNOSIS — Z90710 Acquired absence of both cervix and uterus: Secondary | ICD-10-CM

## 2015-04-21 DIAGNOSIS — M316 Other giant cell arteritis: Secondary | ICD-10-CM

## 2015-04-21 DIAGNOSIS — I5030 Unspecified diastolic (congestive) heart failure: Secondary | ICD-10-CM

## 2015-04-21 DIAGNOSIS — E1151 Type 2 diabetes mellitus with diabetic peripheral angiopathy without gangrene: Secondary | ICD-10-CM | POA: Diagnosis present

## 2015-04-21 DIAGNOSIS — E1122 Type 2 diabetes mellitus with diabetic chronic kidney disease: Secondary | ICD-10-CM | POA: Diagnosis present

## 2015-04-21 DIAGNOSIS — D649 Anemia, unspecified: Secondary | ICD-10-CM | POA: Diagnosis present

## 2015-04-21 DIAGNOSIS — M79671 Pain in right foot: Secondary | ICD-10-CM

## 2015-04-21 DIAGNOSIS — Z79899 Other long term (current) drug therapy: Secondary | ICD-10-CM | POA: Diagnosis not present

## 2015-04-21 DIAGNOSIS — I25118 Atherosclerotic heart disease of native coronary artery with other forms of angina pectoris: Secondary | ICD-10-CM | POA: Diagnosis not present

## 2015-04-21 DIAGNOSIS — Z7982 Long term (current) use of aspirin: Secondary | ICD-10-CM | POA: Diagnosis not present

## 2015-04-21 DIAGNOSIS — F329 Major depressive disorder, single episode, unspecified: Secondary | ICD-10-CM | POA: Diagnosis present

## 2015-04-21 DIAGNOSIS — R9430 Abnormal result of cardiovascular function study, unspecified: Secondary | ICD-10-CM

## 2015-04-21 DIAGNOSIS — I2510 Atherosclerotic heart disease of native coronary artery without angina pectoris: Secondary | ICD-10-CM | POA: Diagnosis present

## 2015-04-21 DIAGNOSIS — Z87891 Personal history of nicotine dependence: Secondary | ICD-10-CM | POA: Diagnosis not present

## 2015-04-21 DIAGNOSIS — M1990 Unspecified osteoarthritis, unspecified site: Secondary | ICD-10-CM | POA: Diagnosis present

## 2015-04-21 DIAGNOSIS — Z880 Allergy status to penicillin: Secondary | ICD-10-CM

## 2015-04-21 DIAGNOSIS — F32A Depression, unspecified: Secondary | ICD-10-CM | POA: Diagnosis present

## 2015-04-21 DIAGNOSIS — R931 Abnormal findings on diagnostic imaging of heart and coronary circulation: Secondary | ICD-10-CM | POA: Diagnosis not present

## 2015-04-21 DIAGNOSIS — I129 Hypertensive chronic kidney disease with stage 1 through stage 4 chronic kidney disease, or unspecified chronic kidney disease: Secondary | ICD-10-CM | POA: Diagnosis present

## 2015-04-21 DIAGNOSIS — I5032 Chronic diastolic (congestive) heart failure: Secondary | ICD-10-CM

## 2015-04-21 DIAGNOSIS — I2583 Coronary atherosclerosis due to lipid rich plaque: Secondary | ICD-10-CM

## 2015-04-21 DIAGNOSIS — I209 Angina pectoris, unspecified: Secondary | ICD-10-CM | POA: Diagnosis not present

## 2015-04-21 DIAGNOSIS — R079 Chest pain, unspecified: Secondary | ICD-10-CM | POA: Diagnosis present

## 2015-04-21 DIAGNOSIS — N179 Acute kidney failure, unspecified: Secondary | ICD-10-CM

## 2015-04-21 DIAGNOSIS — M4716 Other spondylosis with myelopathy, lumbar region: Secondary | ICD-10-CM

## 2015-04-21 DIAGNOSIS — IMO0002 Reserved for concepts with insufficient information to code with codable children: Secondary | ICD-10-CM

## 2015-04-21 DIAGNOSIS — G4733 Obstructive sleep apnea (adult) (pediatric): Secondary | ICD-10-CM | POA: Diagnosis present

## 2015-04-21 DIAGNOSIS — M48061 Spinal stenosis, lumbar region without neurogenic claudication: Secondary | ICD-10-CM

## 2015-04-21 DIAGNOSIS — D638 Anemia in other chronic diseases classified elsewhere: Secondary | ICD-10-CM

## 2015-04-21 DIAGNOSIS — I10 Essential (primary) hypertension: Secondary | ICD-10-CM | POA: Diagnosis not present

## 2015-04-21 DIAGNOSIS — G4730 Sleep apnea, unspecified: Secondary | ICD-10-CM

## 2015-04-21 HISTORY — DX: Chronic kidney disease, stage 3 unspecified: N18.30

## 2015-04-21 HISTORY — DX: Chronic kidney disease, stage 3 (moderate): N18.3

## 2015-04-21 LAB — BASIC METABOLIC PANEL
ANION GAP: 9 (ref 5–15)
BUN: 17 mg/dL (ref 6–20)
CHLORIDE: 101 mmol/L (ref 101–111)
CO2: 27 mmol/L (ref 22–32)
Calcium: 9.4 mg/dL (ref 8.9–10.3)
Creatinine, Ser: 1.14 mg/dL — ABNORMAL HIGH (ref 0.44–1.00)
GFR calc non Af Amer: 47 mL/min — ABNORMAL LOW (ref >60)
GFR, EST AFRICAN AMERICAN: 55 mL/min — AB (ref >60)
Glucose, Bld: 117 mg/dL — ABNORMAL HIGH (ref 65–99)
Potassium: 3.5 mmol/L (ref 3.5–5.1)
Sodium: 137 mmol/L (ref 135–145)

## 2015-04-21 LAB — CBC WITH DIFFERENTIAL/PLATELET
BASOS ABS: 0 10*3/uL (ref 0.0–0.1)
Basophils Relative: 0 %
Eosinophils Absolute: 0.2 10*3/uL (ref 0.0–0.7)
Eosinophils Relative: 3 %
HEMATOCRIT: 33.1 % — AB (ref 36.0–46.0)
Hemoglobin: 10.6 g/dL — ABNORMAL LOW (ref 12.0–15.0)
LYMPHS PCT: 17 %
Lymphs Abs: 1.2 10*3/uL (ref 0.7–4.0)
MCH: 29.1 pg (ref 26.0–34.0)
MCHC: 32 g/dL (ref 30.0–36.0)
MCV: 90.9 fL (ref 78.0–100.0)
MONO ABS: 0.4 10*3/uL (ref 0.1–1.0)
Monocytes Relative: 6 %
NEUTROS ABS: 5 10*3/uL (ref 1.7–7.7)
Neutrophils Relative %: 74 %
Platelets: 243 10*3/uL (ref 150–400)
RBC: 3.64 MIL/uL — ABNORMAL LOW (ref 3.87–5.11)
RDW: 13.9 % (ref 11.5–15.5)
WBC: 6.8 10*3/uL (ref 4.0–10.5)

## 2015-04-21 LAB — APTT: APTT: 28 s (ref 24–37)

## 2015-04-21 LAB — ABO/RH: ABO/RH(D): A POS

## 2015-04-21 LAB — TYPE AND SCREEN
ABO/RH(D): A POS
ANTIBODY SCREEN: NEGATIVE

## 2015-04-21 LAB — GLUCOSE, CAPILLARY: GLUCOSE-CAPILLARY: 164 mg/dL — AB (ref 65–99)

## 2015-04-21 LAB — BRAIN NATRIURETIC PEPTIDE: B Natriuretic Peptide: 12 pg/mL (ref 0.0–100.0)

## 2015-04-21 LAB — TROPONIN I: Troponin I: <0.03 ng/mL (ref <0.031)

## 2015-04-21 LAB — PROTIME-INR
INR: 1.07 (ref 0.00–1.49)
PROTHROMBIN TIME: 14.1 s (ref 11.6–15.2)

## 2015-04-21 LAB — CBG MONITORING, ED: Glucose-Capillary: 141 mg/dL — ABNORMAL HIGH (ref 65–99)

## 2015-04-21 MED ORDER — ATORVASTATIN CALCIUM 40 MG PO TABS
40.0000 mg | ORAL_TABLET | Freq: Every day | ORAL | Status: DC
  Administered 2015-04-22 – 2015-04-23 (×2): 40 mg via ORAL
  Filled 2015-04-21 (×2): qty 1

## 2015-04-21 MED ORDER — CITALOPRAM HYDROBROMIDE 20 MG PO TABS
20.0000 mg | ORAL_TABLET | Freq: Every day | ORAL | Status: DC
  Administered 2015-04-22 – 2015-04-23 (×2): 20 mg via ORAL
  Filled 2015-04-21 (×2): qty 1

## 2015-04-21 MED ORDER — SODIUM CHLORIDE 0.9 % IJ SOLN
3.0000 mL | Freq: Two times a day (BID) | INTRAMUSCULAR | Status: DC
  Administered 2015-04-22: 3 mL via INTRAVENOUS

## 2015-04-21 MED ORDER — ALLOPURINOL 300 MG PO TABS
300.0000 mg | ORAL_TABLET | Freq: Every day | ORAL | Status: DC
  Administered 2015-04-22 – 2015-04-23 (×2): 300 mg via ORAL
  Filled 2015-04-21 (×2): qty 1

## 2015-04-21 MED ORDER — ONDANSETRON HCL 4 MG PO TABS: 4.0000 mg | ORAL_TABLET | Freq: Four times a day (QID) | ORAL | Status: DC | PRN

## 2015-04-21 MED ORDER — MORPHINE SULFATE (PF) 2 MG/ML IV SOLN
2.0000 mg | INTRAVENOUS | Status: DC | PRN
  Administered 2015-04-22 (×2): 2 mg via INTRAVENOUS
  Filled 2015-04-21 (×2): qty 1

## 2015-04-21 MED ORDER — ALUM & MAG HYDROXIDE-SIMETH 200-200-20 MG/5ML PO SUSP: 30.0000 mL | Freq: Four times a day (QID) | ORAL | Status: DC | PRN

## 2015-04-21 MED ORDER — HEPARIN SODIUM (PORCINE) 5000 UNIT/ML IJ SOLN
5000.0000 [IU] | Freq: Three times a day (TID) | INTRAMUSCULAR | Status: DC
  Administered 2015-04-21 – 2015-04-23 (×5): 5000 [IU] via SUBCUTANEOUS
  Filled 2015-04-21 (×5): qty 1

## 2015-04-21 MED ORDER — ACETAMINOPHEN 325 MG PO TABS
650.0000 mg | ORAL_TABLET | Freq: Four times a day (QID) | ORAL | Status: DC | PRN
  Administered 2015-04-22: 650 mg via ORAL
  Filled 2015-04-21: qty 2

## 2015-04-21 MED ORDER — INSULIN ASPART 100 UNIT/ML ~~LOC~~ SOLN
0.0000 [IU] | Freq: Three times a day (TID) | SUBCUTANEOUS | Status: DC
  Administered 2015-04-22: 2 [IU] via SUBCUTANEOUS

## 2015-04-21 MED ORDER — ONDANSETRON HCL 4 MG/2ML IJ SOLN: 4.0000 mg | Freq: Four times a day (QID) | INTRAMUSCULAR | Status: DC | PRN

## 2015-04-21 MED ORDER — ASPIRIN 81 MG PO CHEW: 324.0000 mg | CHEWABLE_TABLET | Freq: Once | ORAL | Status: DC

## 2015-04-21 MED ORDER — TRAZODONE HCL 100 MG PO TABS
100.0000 mg | ORAL_TABLET | Freq: Every evening | ORAL | Status: DC | PRN
  Administered 2015-04-23: 100 mg via ORAL
  Filled 2015-04-21: qty 1

## 2015-04-21 MED ORDER — METOPROLOL TARTRATE 100 MG PO TABS
100.0000 mg | ORAL_TABLET | Freq: Two times a day (BID) | ORAL | Status: DC
  Administered 2015-04-22 – 2015-04-23 (×3): 100 mg via ORAL
  Filled 2015-04-21: qty 1
  Filled 2015-04-21: qty 2
  Filled 2015-04-21: qty 1

## 2015-04-21 MED ORDER — NITROGLYCERIN IN D5W 200-5 MCG/ML-% IV SOLN
0.0000 ug/min | Freq: Once | INTRAVENOUS | Status: AC
Start: 2015-04-21 — End: 2015-04-21
  Administered 2015-04-21: 5 ug/min via INTRAVENOUS
  Filled 2015-04-21: qty 250

## 2015-04-21 MED ORDER — HYDRALAZINE HCL 20 MG/ML IJ SOLN: 5.0000 mg | INTRAMUSCULAR | Status: DC | PRN

## 2015-04-21 MED ORDER — NITROGLYCERIN 2 % TD OINT
1.0000 [in_us] | TOPICAL_OINTMENT | Freq: Four times a day (QID) | TRANSDERMAL | Status: DC
  Filled 2015-04-21: qty 1

## 2015-04-21 MED ORDER — SODIUM CHLORIDE 0.9 % IV SOLN: INTRAVENOUS | Status: DC

## 2015-04-21 MED ORDER — ASPIRIN EC 81 MG PO TBEC
81.0000 mg | DELAYED_RELEASE_TABLET | Freq: Every day | ORAL | Status: DC
  Administered 2015-04-22: 81 mg via ORAL
  Filled 2015-04-21: qty 1

## 2015-04-21 MED ORDER — PANTOPRAZOLE SODIUM 40 MG PO TBEC
40.0000 mg | DELAYED_RELEASE_TABLET | Freq: Every day | ORAL | Status: DC
  Administered 2015-04-22: 40 mg via ORAL
  Filled 2015-04-21: qty 1

## 2015-04-21 MED ORDER — NITROGLYCERIN IN D5W 200-5 MCG/ML-% IV SOLN: 0.0000 ug/min | INTRAVENOUS | Status: DC

## 2015-04-21 MED ORDER — ACETAMINOPHEN 650 MG RE SUPP: 650.0000 mg | Freq: Four times a day (QID) | RECTAL | Status: DC | PRN

## 2015-04-21 MED ORDER — HYDROCODONE-ACETAMINOPHEN 5-325 MG PO TABS
1.0000 | ORAL_TABLET | ORAL | Status: DC | PRN
  Administered 2015-04-23: 2 via ORAL
  Filled 2015-04-21: qty 2

## 2015-04-21 NOTE — ED Provider Notes (Signed)
CSN: [id number]     Arrival date & time 04/21/15  1739 History   First MD Initiated Contact with Patient 04/21/15 1747     Chief Complaint  Patient presents with  . Chest Pain     (Consider location/radiation/quality/duration/timing/severity/associated sxs/prior Treatment) Patient is a 69 y.o. female presenting with chest pain. The history is provided by the patient.  Chest Pain Associated symptoms: fatigue and shortness of breath   Associated symptoms: no abdominal pain, no back pain, no headache, no nausea, no numbness, not vomiting and no weakness    patient has a dull chest pain. She's had it on and off for the last 2 weeks. His been getting worse. States she can no longer sleep flatter has sleep with 4 pillows. The pain is somewhat improved with nitroglycerin. This is the same pain she has had with her angina in the past. No fevers. She's had shortness of breath but no cough. States she's not been able to do the same physical activity that she had been. Pain is improved somewhat with nitroglycerin by EMS. States she is still having some pain.  Past Medical History  Diagnosis Date  . Atypical face pain   . Hypertension   . CAD (coronary artery disease)     Catheterization 2006, mild two-vessel nonobstructive disease  . GERD (gastroesophageal reflux disease)   . Headache(784.0)   . Hyperlipidemia   . Arthritis   . Sleep apnea     CPAP compliance ???  . Diabetes mellitus   . Shortness of breath   . PAD (peripheral artery disease)     Dr. Darrick Penna,  Bilateral superficial femoral artery occlusions with one vessel runoff via the peroneal artery  . Ejection fraction     EF 60%, echo, October, 2010,  . Urinary incontinence   . Overweight(278.02)   . Mitral regurgitation     Mild, echo, October, 2010  . Fluid overload   . Foot pain, right     November, 2012  . Stroke   . UTI (lower urinary tract infection)   . CKD (chronic kidney disease), stage III    Past Surgical History   Procedure Laterality Date  . Cesarean section    . Abdominal hysterectomy    . Eye surgery  12/11/10  . Vesicovaginal fistula closure w/ tah    . Artery biopsy  12/27/2011    Procedure: BIOPSY TEMPORAL ARTERY;  Surgeon: Sherren Kerns, MD;  Location: [facility] OR;  Service: Vascular;  Laterality: Left;   Family History  Problem Relation Age of Onset  . Diabetes Mother   . Hyperlipidemia Mother   . Hypertension Mother   . Heart disease Father   . Diabetes Sister   . Hypertension Sister   . Hyperlipidemia Sister   . Anesthesia problems Neg Hx    Social History  Substance Use Topics  . Smoking status: Former Smoker    Types: Cigarettes    Quit date: 11/13/2010  . Smokeless tobacco: None  . Alcohol Use: Yes     Comment: Social Drinker only/////////STOP DRINKING IN 2001   OB History    No data available     Review of Systems  Constitutional: Positive for fatigue. Negative for activity change and appetite change.  Eyes: Negative for pain.  Respiratory: Positive for shortness of breath. Negative for chest tightness.   Cardiovascular: Positive for chest pain. Negative for leg swelling.  Gastrointestinal: Negative for nausea, vomiting, abdominal pain and diarrhea.  Genitourinary: Negative for flank pain.  Musculoskeletal: Negative for back pain and neck stiffness.  Skin: Negative for rash.  Neurological: Negative for weakness, numbness and headaches.  Psychiatric/Behavioral: Negative for behavioral problems.      Allergies  Amoxicillin and Latex  Home Medications   Prior to Admission medications   Medication Sig Start Date End Date Taking? Authorizing Whittaker Lenis  allopurinol (ZYLOPRIM) 300 MG tablet Take 300 mg by mouth daily.  11/20/13  Yes Historical Edrian Melucci, MD  aspirin EC 81 MG tablet Take 1 tablet (81 mg total) by mouth daily. 02/27/14  Yes Luis Abed, MD  atorvastatin (LIPITOR) 40 MG tablet TAKE 1 TABLET BY MOUTH EVERY DAY 03/11/15  Yes Luis Abed, MD   hydrochlorothiazide (HYDRODIURIL) 25 MG tablet Take 25 mg by mouth daily.  01/08/14  Yes Historical Abdi Husak, MD  HYDROcodone-acetaminophen (NORCO/VICODIN) 5-325 MG per tablet Take 1-2 tablets by mouth every 4 (four) hours as needed for moderate pain or severe pain.  10/31/14  Yes Historical Kyre Jeffries, MD  metFORMIN (GLUCOPHAGE) 1000 MG tablet Take 1,000 mg by mouth 2 (two) times daily with a meal.  01/14/14  Yes Historical Aadyn Buchheit, MD  metoprolol (LOPRESSOR) 100 MG tablet Take 100 mg by mouth 2 (two) times daily.  01/09/14  Yes Historical Mande Auvil, MD  NITROSTAT 0.4 MG SL tablet DISSOLVE ONE TABLET UNDER TONGUE AS NEEDED FOR CHEST PAIN EVERY 5 MINUTES 04/27/14  Yes Luis Abed, MD  traZODone (DESYREL) 100 MG tablet Take 100 mg by mouth at bedtime as needed for sleep.  09/10/14  Yes Historical Jerris Keltz, MD  citalopram (CELEXA) 20 MG tablet Take 20 mg by mouth daily.     Historical Elwyn Klosinski, MD   BP 139/66 mmHg  Pulse 55  Temp(Src) 97.8 F (36.6 C) (Oral)  Resp 19  Ht 5' 2.5" (1.588 m)  Wt 194 lb 3.2 oz (88.089 kg)  BMI 34.93 kg/m2  SpO2 98% Physical Exam  Constitutional: She appears well-developed.  HENT:  Head: Atraumatic.  Neck: Neck supple. No JVD present.  Cardiovascular: Normal rate.   Pulmonary/Chest: Effort normal.  Abdominal: Soft.  Musculoskeletal: She exhibits edema.  Mild bilateral lower extremity pitting edema.  Neurological: She is alert.  Skin: Skin is warm.  Psychiatric: She has a normal mood and affect.    ED Course  Procedures (including critical care time) Labs Review Labs Reviewed  CBC WITH DIFFERENTIAL/PLATELET - Abnormal; Notable for the following:    RBC 3.64 (*)    Hemoglobin 10.6 (*)    HCT 33.1 (*)    All other components within normal limits  BASIC METABOLIC PANEL - Abnormal; Notable for the following:    Glucose, Bld 117 (*)    Creatinine, Ser 1.14 (*)    GFR calc non Af Amer 47 (*)    GFR calc Af Amer 55 (*)    All other components within normal  limits  GLUCOSE, CAPILLARY - Abnormal; Notable for the following:    Glucose-Capillary 164 (*)    All other components within normal limits  CBG MONITORING, ED - Abnormal; Notable for the following:    Glucose-Capillary 141 (*)    All other components within normal limits  MRSA PCR SCREENING  TROPONIN I  BRAIN NATRIURETIC PEPTIDE  TROPONIN I  PROTIME-INR  APTT  TROPONIN I  TROPONIN I  URINE RAPID DRUG SCREEN, HOSP PERFORMED  HEMOGLOBIN A1C  LIPID PANEL  COMPREHENSIVE METABOLIC PANEL  CBC  TYPE AND SCREEN  ABO/RH    Imaging Review Dg Chest Portable 1 View  04/21/2015   CLINICAL DATA:  Chest pain and shortness of breath intermittent for 1 week, onset today 4 hours prior.  EXAM: PORTABLE CHEST 1 VIEW  COMPARISON:  Frontal and lateral views 03/18/2013  FINDINGS: Mild elevation of right hemidiaphragm. The cardiomediastinal contours are normal. Pulmonary vasculature is normal. No consolidation, pleural effusion, or pneumothorax. No acute osseous abnormalities are seen.  IMPRESSION: No acute pulmonary process.   Electronically Signed   By: Rubye Oaks M.D.   On: 04/21/2015 18:15   I have personally reviewed and evaluated these images and lab results as part of my medical decision-making.   EKG Interpretation   Date/Time:  Monday April 21 2015 17:55:05 EDT Ventricular Rate:  55 PR Interval:  182 QRS Duration: 85 QT Interval:  428 QTC Calculation: 409 R Axis:   -24 Text Interpretation:  Sinus rhythm Borderline left axis deviation Abnormal  R-wave progression, early transition Borderline T wave abnormalities  Confirmed by Rubin Payor  MD, Harrold Donath [phone]) on 04/21/2015 5:57:57 PM      MDM   Final diagnoses:  Chest pain, unspecified chest pain type   patient with chest pain. History of coronary artery disease decreased physical activity. Will admit to internal medicine.     Benjiman Core, MD 04/22/15 Moses Manners

## 2015-04-21 NOTE — H&P (Addendum)
[facility] History and Physical  AUTRY DROEGE ZOX:[id number] DOB: 04-Mar-1946 DOA: 04/21/2015  Referring physician: ED physician PCP: Pamelia Hoit, MD  Specialists:   Chief Complaint: Chest pain  HPI: [name] is a 69 y.o. female with PMH of CAD, hypertension, hyperlipidemia, diabetes mellitus, GERD, L2, OSA, PAD, mitral valve regurgitation, history of stroke, diastolic congestive heart failure (EF 60-65%), CKD-III, who presents with chest pain.  Patient reports that she has been having chest pain in the past 7 days. Her chest pain is located in the substernal area, constant, intermittently accentuated, radiating to the left chest. Highest level of chest pain was 7/10 severity, currently is 3 out of 10 in severity on nitroglycerin drip. Patient does not have cough, fever or chills. No tenderness over calf areas. She has chronic bilateral leg pain due to PAD, which has not changed. Patient does not have abdominal pain, nausea, vomiting, symptoms of UTI, unilateral weakness.   In ED, patient was found to have negative troponin, CBC 6.8, temperature normal, bradycardia, stable renal function, negative chest x-ray. Patient is admitted to inpatient for further evaluation and treatment.   Where does patient live?   At home    Can patient participate in ADLs?  Yes     Review of Systems:   General: no fevers, chills, no changes in body weight,  has fatigue HEENT: no blurry vision, hearing changes or sore throat Pulm: no dyspnea, coughing, wheezing CV: chest chest pain, palpitations Abd: no nausea, vomiting, abdominal pain, diarrhea, constipation GU: no dysuria, burning on urination, increased urinary frequency, hematuria  Ext: no leg edema Neuro: no unilateral weakness, numbness, or tingling, no vision change or hearing loss. Has chronic R shoulder pain. Skin: no rash MSK: No muscle spasm, no deformity, no limitation of range of movement in spin Heme: No easy bruising.   Travel history: No recent long distant travel.  Allergy:  Allergies  Allergen Reactions  . Amoxicillin     Yeast infection  . Latex Itching    Past Medical History  Diagnosis Date  . Atypical face pain   . Hypertension   . CAD (coronary artery disease)     Catheterization 2006, mild two-vessel nonobstructive disease  . GERD (gastroesophageal reflux disease)   . Headache(784.0)   . Hyperlipidemia   . Arthritis   . Sleep apnea     CPAP compliance ???  . Diabetes mellitus   . Shortness of breath   . PAD (peripheral artery disease)     Dr. Darrick Penna,  Bilateral superficial femoral artery occlusions with one vessel runoff via the peroneal artery  . Ejection fraction     EF 60%, echo, October, 2010,  . Urinary incontinence   . Overweight(278.02)   . Mitral regurgitation     Mild, echo, October, 2010  . Fluid overload   . Foot pain, right     November, 2012  . Stroke   . UTI (lower urinary tract infection)   . CKD (chronic kidney disease), stage III     Past Surgical History  Procedure Laterality Date  . Cesarean section    . Abdominal hysterectomy    . Eye surgery  12/11/10  . Vesicovaginal fistula closure w/ tah    . Artery biopsy  12/27/2011    Procedure: BIOPSY TEMPORAL ARTERY;  Surgeon: Sherren Kerns, MD;  Location: [facility] OR;  Service: Vascular;  Laterality: Left;    Social History:  reports that she quit smoking about 4 years ago. Her smoking  use included Cigarettes. She does not have any smokeless tobacco history on file. She reports that she drinks alcohol. She reports that she does not use illicit drugs.  Family History:  Family History  Problem Relation Age of Onset  . Diabetes Mother   . Hyperlipidemia Mother   . Hypertension Mother   . Heart disease Father   . Diabetes Sister   . Hypertension Sister   . Hyperlipidemia Sister   . Anesthesia problems Neg Hx      Prior to Admission medications   Medication Sig Start Date End Date Taking? Authorizing  Provider  allopurinol (ZYLOPRIM) 300 MG tablet Take 300 mg by mouth daily.  11/20/13  Yes Historical Provider, MD  aspirin EC 81 MG tablet Take 1 tablet (81 mg total) by mouth daily. 02/27/14  Yes Luis Abed, MD  atorvastatin (LIPITOR) 40 MG tablet TAKE 1 TABLET BY MOUTH EVERY DAY 03/11/15  Yes Luis Abed, MD  hydrochlorothiazide (HYDRODIURIL) 25 MG tablet Take 25 mg by mouth daily.  01/08/14  Yes Historical Provider, MD  HYDROcodone-acetaminophen (NORCO/VICODIN) 5-325 MG per tablet Take 1-2 tablets by mouth every 4 (four) hours as needed for moderate pain or severe pain.  10/31/14  Yes Historical Provider, MD  metFORMIN (GLUCOPHAGE) 1000 MG tablet Take 1,000 mg by mouth 2 (two) times daily with a meal.  01/14/14  Yes Historical Provider, MD  metoprolol (LOPRESSOR) 100 MG tablet Take 100 mg by mouth 2 (two) times daily.  01/09/14  Yes Historical Provider, MD  NITROSTAT 0.4 MG SL tablet DISSOLVE ONE TABLET UNDER TONGUE AS NEEDED FOR CHEST PAIN EVERY 5 MINUTES 04/27/14  Yes Luis Abed, MD  traZODone (DESYREL) 100 MG tablet Take 100 mg by mouth at bedtime as needed for sleep.  09/10/14  Yes Historical Provider, MD  citalopram (CELEXA) 20 MG tablet Take 20 mg by mouth daily.     Historical Provider, MD    Physical Exam: Filed Vitals:   04/21/15 2240 04/21/15 2340 04/21/15 2341 04/22/15 0040  BP: 137/66 139/66  122/66  Pulse: 56 55 57 54  Temp:      TempSrc:      Resp: 15 19 18 15   Height:      Weight:      SpO2: 100% 98% 98% 99%   General: Not in acute distress. Dry mucous and membrane. HEENT:       Eyes: PERRL, EOMI, no scleral icterus.       ENT: No discharge from the ears and nose, no pharynx injection, no tonsillar enlargement.        Neck: No JVD, no bruit, no mass felt. Heme: No neck lymph node enlargement. Cardiac: S1/S2, RRR, No murmurs, No gallops or rubs. Pulm:  No rales, wheezing, rhonchi or rubs. Abd: Soft, nondistended, nontender, no rebound pain, no organomegaly, BS  present. Ext: No pitting leg edema bilaterally. 1+DP/PT pulse bilaterally. Both feet are warm.  Musculoskeletal: No joint deformities, No joint redness or warmth, no limitation of ROM in spin. Skin: No rashes.  Neuro: Alert, oriented X3, cranial nerves II-XII grossly intact, muscle strength 5/5 in all extremities, sensation to light touch intact.  Psych: Patient is not psychotic, no suicidal or hemocidal ideation.  Labs on Admission:  Basic Metabolic Panel:  Recent Labs Lab 04/21/15 1841  NA 137  K 3.5  CL 101  CO2 27  GLUCOSE 117*  BUN 17  CREATININE 1.14*  CALCIUM 9.4   Liver Function Tests: No results for input(s):  AST, ALT, ALKPHOS, BILITOT, PROT, ALBUMIN in the last 168 hours. No results for input(s): LIPASE, AMYLASE in the last 168 hours. No results for input(s): AMMONIA in the last 168 hours. CBC:  Recent Labs Lab 04/21/15 1841  WBC 6.8  NEUTROABS 5.0  HGB 10.6*  HCT 33.1*  MCV 90.9  PLT 243   Cardiac Enzymes:  Recent Labs Lab 04/21/15 1841 04/21/15 2113  TROPONINI <0.03 <0.03    BNP (last 3 results)  Recent Labs  04/21/15 1842  BNP 12.0    ProBNP (last 3 results) No results for input(s): PROBNP in the last 8760 hours.  CBG:  Recent Labs Lab 04/21/15 2118 04/21/15 2301  GLUCAP 141* 164*    Radiological Exams on Admission: Dg Chest Portable 1 View  04/21/2015   CLINICAL DATA:  Chest pain and shortness of breath intermittent for 1 week, onset today 4 hours prior.  EXAM: PORTABLE CHEST 1 VIEW  COMPARISON:  Frontal and lateral views 03/18/2013  FINDINGS: Mild elevation of right hemidiaphragm. The cardiomediastinal contours are normal. Pulmonary vasculature is normal. No consolidation, pleural effusion, or pneumothorax. No acute osseous abnormalities are seen.  IMPRESSION: No acute pulmonary process.   Electronically Signed   By: Rubye Oaks M.D.   On: 04/21/2015 18:15    EKG: Independently reviewed.  Abnormal findings: Bradycardia, LAD,  early R-wave progression, diffuse T-wave flattening  Assessment/Plan Principal Problem:   Chest pain Active Problems:   HLD (hyperlipidemia)   Overweight   Essential hypertension   CAD (coronary artery disease)   GERD (gastroesophageal reflux disease)   Sleep apnea   PAD (peripheral artery disease)   Diastolic CHF   Gout   CKD (chronic kidney disease), stage III   Chronic diastolic CHF (congestive heart failure)   Diabetes mellitus without complication   Depression  Chest pain and CAD: No pneumonia on chest x-ray. No signs of DVT or oxygen saturation, her chest pain is not pleuritic, less likely to have pulmonary embolism. Will admit for chest pain r/o. Initial troponin is negative. Pt responded to nitroglycerin drip, and her chest pain improved. Patient had card catch 2006, which showed two-vessel disease, no stent placement.  - will admit to SDU - cycle CE q6 x3 and repeat her EKG in the am  - Nitroglycerin gtt - prn Morphine, and aspirin, lipitor, metoprol - Risk factor stratification: will check FLP and A1C  - 2d echo - LE doppler to r/o DVT - Protonix for possible acid reflux - Called card PA  in AM, will see today.    HLD: Last LDL was not on record -Continue home medications: Lipitor -Check FLP  Essential hypertension: -Hold HCTZ -Continue metoprolol -Hydralazine when necessary  GERD: -Protonix  OSA: -CPAP  Chronic diastolic heart failure: 2d echo on 05/16/09 showed EF of 60-65%. Patient is on HCTZ at home. CHF is compensated. BNP 12.0. -watch volume status close'y -ASA and metoprolol  Gout: stable. -Continue allopurinol  CKD-III: stable. BL Cre 1.1-1.2, her creatinine is 1.14, BUN 17 on admission, which is at baseline. -Follow-up renal function by BMP  DM-II: Last A1c 6.2 on 09/23/14. well controled. Patient is taking metformin at home -SSI  Depression Stable, no suicidal or homicidal ideations. -Continue home medications: Celexa   DVT ppx: SQ  Heparin Code Status: Full code Family Communication: None at bed side.  Disposition Plan: Admit to inpatient   Date of Service 04/22/2015    Lorretta Harp [facility] Pager [phone]  If 7PM-7AM, please contact night-coverage [email or web address]  Password [email or web address] 04/22/2015, 3:22 AM

## 2015-04-21 NOTE — ED Notes (Signed)
PER EMS: pt reports left CP associated with SOB x 1 week. She reports she has a blockage and so she has prescription for nitro. Usually when she takes it she gets relief but always comes back. Episode of CP today started around 1400. 2 SL nitro and 324 aspirin given in route with relief, pain 4/10. BP: 171/80, HR-55, O2-96% RA. CBG-116

## 2015-04-22 ENCOUNTER — Inpatient Hospital Stay (HOSPITAL_COMMUNITY): Payer: Medicare Other

## 2015-04-22 DIAGNOSIS — R079 Chest pain, unspecified: Secondary | ICD-10-CM

## 2015-04-22 DIAGNOSIS — F329 Major depressive disorder, single episode, unspecified: Secondary | ICD-10-CM | POA: Diagnosis present

## 2015-04-22 DIAGNOSIS — I209 Angina pectoris, unspecified: Secondary | ICD-10-CM

## 2015-04-22 DIAGNOSIS — I25118 Atherosclerotic heart disease of native coronary artery with other forms of angina pectoris: Secondary | ICD-10-CM

## 2015-04-22 DIAGNOSIS — R072 Precordial pain: Secondary | ICD-10-CM

## 2015-04-22 DIAGNOSIS — I10 Essential (primary) hypertension: Secondary | ICD-10-CM

## 2015-04-22 DIAGNOSIS — E785 Hyperlipidemia, unspecified: Secondary | ICD-10-CM

## 2015-04-22 DIAGNOSIS — I2510 Atherosclerotic heart disease of native coronary artery without angina pectoris: Principal | ICD-10-CM

## 2015-04-22 DIAGNOSIS — E119 Type 2 diabetes mellitus without complications: Secondary | ICD-10-CM

## 2015-04-22 DIAGNOSIS — F32A Depression, unspecified: Secondary | ICD-10-CM | POA: Diagnosis present

## 2015-04-22 DIAGNOSIS — I5032 Chronic diastolic (congestive) heart failure: Secondary | ICD-10-CM

## 2015-04-22 LAB — LIPID PANEL
CHOLESTEROL: 110 mg/dL (ref 0–200)
HDL: 27 mg/dL — AB (ref >40)
LDL Cholesterol: 54 mg/dL (ref 0–99)
TRIGLYCERIDES: 143 mg/dL (ref <150)
Total CHOL/HDL Ratio: 4.1 RATIO
VLDL: 29 mg/dL (ref 0–40)

## 2015-04-22 LAB — COMPREHENSIVE METABOLIC PANEL
ALK PHOS: 93 U/L (ref 38–126)
ALT: 11 U/L — ABNORMAL LOW (ref 14–54)
ANION GAP: 8 (ref 5–15)
AST: 18 U/L (ref 15–41)
Albumin: 3.3 g/dL — ABNORMAL LOW (ref 3.5–5.0)
BILIRUBIN TOTAL: 1.1 mg/dL (ref 0.3–1.2)
BUN: 16 mg/dL (ref 6–20)
CALCIUM: 8.9 mg/dL (ref 8.9–10.3)
CO2: 28 mmol/L (ref 22–32)
Chloride: 102 mmol/L (ref 101–111)
Creatinine, Ser: 1.05 mg/dL — ABNORMAL HIGH (ref 0.44–1.00)
GFR, EST NON AFRICAN AMERICAN: 52 mL/min — AB (ref >60)
Glucose, Bld: 104 mg/dL — ABNORMAL HIGH (ref 65–99)
Potassium: 3.2 mmol/L — ABNORMAL LOW (ref 3.5–5.1)
SODIUM: 138 mmol/L (ref 135–145)
TOTAL PROTEIN: 7.2 g/dL (ref 6.5–8.1)

## 2015-04-22 LAB — NM MYOCAR MULTI W/SPECT W/WALL MOTION / EF
CSEPED: 0 min
CSEPEW: 1 METS
CSEPHR: 57 %
CSEPPHR: 85 {beats}/min
Exercise duration (sec): 0 s
MPHR: 149 {beats}/min
Rest HR: 55 {beats}/min

## 2015-04-22 LAB — RAPID URINE DRUG SCREEN, HOSP PERFORMED
AMPHETAMINES: NOT DETECTED
Barbiturates: NOT DETECTED
Benzodiazepines: NOT DETECTED
Cocaine: NOT DETECTED
OPIATES: POSITIVE — AB
Tetrahydrocannabinol: NOT DETECTED

## 2015-04-22 LAB — CBC
HEMATOCRIT: 29.8 % — AB (ref 36.0–46.0)
HEMOGLOBIN: 9.6 g/dL — AB (ref 12.0–15.0)
MCH: 29.3 pg (ref 26.0–34.0)
MCHC: 32.2 g/dL (ref 30.0–36.0)
MCV: 90.9 fL (ref 78.0–100.0)
Platelets: 258 10*3/uL (ref 150–400)
RBC: 3.28 MIL/uL — ABNORMAL LOW (ref 3.87–5.11)
RDW: 14.2 % (ref 11.5–15.5)
WBC: 6.2 10*3/uL (ref 4.0–10.5)

## 2015-04-22 LAB — MRSA PCR SCREENING: MRSA BY PCR: NEGATIVE

## 2015-04-22 LAB — GLUCOSE, CAPILLARY
GLUCOSE-CAPILLARY: 184 mg/dL — AB (ref 65–99)
GLUCOSE-CAPILLARY: 99 mg/dL (ref 65–99)
Glucose-Capillary: 87 mg/dL (ref 65–99)

## 2015-04-22 LAB — TROPONIN I: Troponin I: <0.03 ng/mL (ref <0.031)

## 2015-04-22 MED ORDER — ASPIRIN 81 MG PO CHEW
81.0000 mg | CHEWABLE_TABLET | ORAL | Status: AC
  Administered 2015-04-23: 81 mg via ORAL
  Filled 2015-04-22: qty 1

## 2015-04-22 MED ORDER — SODIUM CHLORIDE 0.9 % WEIGHT BASED INFUSION
3.0000 mL/kg/h | INTRAVENOUS | Status: DC
  Administered 2015-04-23: 3 mL/kg/h via INTRAVENOUS

## 2015-04-22 MED ORDER — REGADENOSON 0.4 MG/5ML IV SOLN
0.4000 mg | Freq: Once | INTRAVENOUS | Status: AC
  Administered 2015-04-22: 0.4 mg via INTRAVENOUS
  Filled 2015-04-22: qty 5

## 2015-04-22 MED ORDER — REGADENOSON 0.4 MG/5ML IV SOLN
INTRAVENOUS | Status: AC
  Administered 2015-04-22: 0.4 mg via INTRAVENOUS
  Filled 2015-04-22: qty 5

## 2015-04-22 MED ORDER — POTASSIUM CHLORIDE CRYS ER 20 MEQ PO TBCR
40.0000 meq | EXTENDED_RELEASE_TABLET | Freq: Two times a day (BID) | ORAL | Status: AC
  Administered 2015-04-22 (×2): 40 meq via ORAL
  Filled 2015-04-22 (×2): qty 2

## 2015-04-22 MED ORDER — TECHNETIUM TC 99M SESTAMIBI GENERIC - CARDIOLITE
30.0000 | Freq: Once | INTRAVENOUS | Status: AC | PRN
  Administered 2015-04-22: 30 via INTRAVENOUS

## 2015-04-22 MED ORDER — SODIUM CHLORIDE 0.9 % IV SOLN: 250.0000 mL | INTRAVENOUS | Status: DC | PRN

## 2015-04-22 MED ORDER — SODIUM CHLORIDE 0.9 % WEIGHT BASED INFUSION: 1.0000 mL/kg/h | INTRAVENOUS | Status: DC

## 2015-04-22 MED ORDER — SODIUM CHLORIDE 0.9 % IJ SOLN: 3.0000 mL | INTRAMUSCULAR | Status: DC | PRN

## 2015-04-22 MED ORDER — TECHNETIUM TC 99M SESTAMIBI GENERIC - CARDIOLITE
10.0000 | Freq: Once | INTRAVENOUS | Status: AC | PRN
  Administered 2015-04-22: 10 via INTRAVENOUS

## 2015-04-22 MED ORDER — SODIUM CHLORIDE 0.9 % IJ SOLN
3.0000 mL | Freq: Two times a day (BID) | INTRAMUSCULAR | Status: DC
Start: 2015-04-22 — End: 2015-04-23
  Administered 2015-04-23: 3 mL via INTRAVENOUS

## 2015-04-22 NOTE — Evaluation (Signed)
Physical Therapy Evaluation Patient Details Name: [name] MRN: [id number] DOB: 04-06-1946 Today's Date: 04/22/2015   History of Present Illness  Ms. Hartner is a 51F with non-obstructive CAD, hypertension, hyperlipidemia, DM Type 2, OSA, PAD (bilateral SFA occlusions and mild carotid disease), MR, CVA and CKD 3 and chronic diastolic heart failure who presents with chest pain. She reports symptoms that have been getting progressively worse for the last week. It occurs both at rest as well as with stress.  Clinical Impression  Pt presented with impairments listed in problem list below. Pt presents with functional independence at baseline to return to prior level of function. Patient with history of falls and some balance deficits, but educated in safety fall precautions and pt to self monitor changes and report to PCP if she feels therapy is needed (due to financial concerns). Pt to be d/c'd to home.    Follow Up Recommendations No PT follow up    Equipment Recommendations       Recommendations for Other Services       Precautions / Restrictions Precautions Precautions: Fall Restrictions Weight Bearing Restrictions: No      Mobility  Bed Mobility                 Transfers Overall transfer level: Independent Equipment used: None             General transfer comment: Mod I for increased time  Ambulation/Gait Ambulation/Gait assistance: Supervision Ambulation Distance (Feet): 150 Feet Assistive device: None Gait Pattern/deviations: Wide base of support Gait velocity: slow      Stairs            Wheelchair Mobility    Modified Rankin (Stroke Patients Only)       Balance Overall balance assessment: History of Falls;Needs assistance Sitting-balance support: No upper extremity supported Sitting balance-Leahy Scale: Good     Standing balance support: No upper extremity supported Standing balance-Leahy Scale: Fair       Tandem Stance -  Right Leg: 20 (Semi Sharpened Rhomberg) Tandem Stance - Left Leg: 2 (semi-sharpened Rhomberg: Required mod assistance to avoid LOB to R) Rhomberg - Eyes Opened: 20       Standardized Balance Assessment Standardized Balance Assessment : TUG: Timed Up and Go Test     Timed Up and Go Test Normal TUG (seconds): 13.52     Pertinent Vitals/Pain Pain Assessment: No/denies pain    Home Living Family/patient expects to be discharged to:: Private residence Living Arrangements: Alone   Type of Home: Apartment Home Access: Level entry     Home Layout: One level Home Equipment: Grab bars - tub/shower;Bedside commode Additional Comments: Has tub/shower combo with curtain and standard height toilet seats    Prior Function Level of Independence: Independent (Pt reports there is an aide that comes to see her for 2 hrs Monday through Friday.)               Hand Dominance   Dominant Hand: Right    Extremity/Trunk Assessment   Upper Extremity Assessment: Defer to OT evaluation           Lower Extremity Assessment: LLE deficits/detail   LLE Deficits / Details: 5-5 Knee ext, 4-/5 Knee flex, 4/5 hip flex, 5-/5 DF in sitting  Cervical / Trunk Assessment: Normal  Communication   Communication: No difficulties  Cognition Arousal/Alertness: Awake/alert Behavior During Therapy: WFL for tasks assessed/performed Overall Cognitive Status: Within Functional Limits for tasks assessed  General Comments      Exercises        Assessment/Plan    PT Assessment Patent does not need any further PT services  PT Diagnosis     PT Problem List    PT Treatment Interventions     PT Goals (Current goals can be found in the Care Plan section) Acute Rehab PT Goals Patient Stated Goal: no more chest pain    Frequency     Barriers to discharge        Co-evaluation               End of Session Equipment Utilized During Treatment: Gait  belt Activity Tolerance: Patient tolerated treatment well Patient left: in chair;with call bell/phone within reach           Time: 0300-0330 PT Time Calculation (min) (ACUTE ONLY): 30 min   Charges:   PT Evaluation $Initial PT Evaluation Tier I: 1 Procedure PT Treatments $Gait Training: 8-22 mins   PT G Codes:        WYNN,CYNDI 07-May-2015, 4:05 PM  Sheran Lawless, PT [phone] May 07, 2015

## 2015-04-22 NOTE — Progress Notes (Signed)
1 day lexiscan myoview completed without significant complication, pending final result by [facility] reader  Signed, Fernando Torry PA Pager: [phone]  

## 2015-04-22 NOTE — Consult Note (Signed)
Patient ID: [name] MRN: [id number] DOB/AGE: January 06, 1946 69 y.o.  Admit date: 04/21/2015 Primary Physician Pamelia Hoit, MD Primary Cardiologist Willa Rough, MD   Chief Complaint  Chest pain  HPI: [name] is a 43F with non-obstructive CAD, hypertension, hyperlipidemia, DM Type 2, OSA, PAD (bilateral SFA occlusions and mild carotid disease), MR, CVA and CKD 3 and chronic diastolic heart failure who presents with chest pain. She reports symptoms that have been getting progressively worse for the last week. It occurs both at rest as well as with stress. The symptoms awaken her from sleep at times. She notes substernal chest pressure that radiates both to the right and left. She also endorses left-sided shoulder discomfort. The episodes last for up to 30 minutes at a time. Each episode lasts around 30 minutes. Subungual nitroglycerin helps, but the discomfort returns. She denies any nausea, vomiting, palpitations, dizziness, or lower extremity edema. However she does note shortness of breath and diaphoresis.   [name] called EMS and arrived at the ED, where she was hemodynamically stable.  She had negative cardiac enzymes x3 and BNP was 12.  Her hemoglobin was 9.6.  She was admitted to the hospitalist service for further management. She was started on a nitroglycerin infusion, which was increased to 15 g/min due to chest pain overnight. This did not help, but the morphine she received did help.  [name] had a nuclear stress test 03/2004 that was non-diagnostic due to motion artifact.  She had a cardiac catheterization in 2006 that revealed mild, non-obstructive CAD.  Review of Systems:     Cardiac Review of Systems: {Y] = yes  = no  Chest Pain [ x   ]  Resting SOB [   ] Exertional SOB  [x  ]  Orthopnea [  ]   Pedal Edema [   ]    Palpitations [  ] Syncope  [  ]   Presyncope [   ]  General Review of Systems: [Y] = yes [  ]=no Constitional: recent weight change [  ];  anorexia [  ]; fatigue [  ]; nausea [  ]; night sweats [  ]; fever [  ]; or chills [  ];    Eyes : blurred vision [  ]; diplopia [   ]; vision changes [  ];  Amaurosis fugax[  ]; Resp: cough [  ];  wheezing[  ];  hemoptysis[  ];  PND [  ];  GI:  gallstones[  ], vomiting[  ];  dysphagia[  ]; melena[  ];  hematochezia [  ]; heartburn[  ];   GU: kidney stones [  ]; hematuria[  ];   dysuria [  ];  nocturia[  ]; incontinence [  ];             Skin: rash, swelling[  ];, hair loss[  ];  peripheral edema[  ];  or itching[  ]; Musculosketetal: myalgias[  ];  joint swelling[  ];  joint erythema[  ];  joint pain[  ];  back pain[  ];  Heme/Lymph: bruising[  ];  bleeding[  ];  anemia[  ];  Neuro: TIA[  ];  headaches[  ];  stroke[  ];  vertigo[  ];  seizures[  ];   paresthesias[  ];  difficulty walking[  ];  Psych:depression[  ]; anxiety[  ];  Endocrine: diabetes[  ];  thyroid dysfunction[  ];  Other:  Past Medical History  Diagnosis Date  . Atypical face pain   . Hypertension   . CAD (coronary artery disease)     Catheterization 2006, mild two-vessel nonobstructive disease  . GERD (gastroesophageal reflux disease)   . Headache(784.0)   . Hyperlipidemia   . Arthritis   . Sleep apnea     CPAP compliance ???  . Diabetes mellitus   . Shortness of breath   . PAD (peripheral artery disease)     Dr. Darrick Penna,  Bilateral superficial femoral artery occlusions with one vessel runoff via the peroneal artery  . Ejection fraction     EF 60%, echo, October, 2010,  . Urinary incontinence   . Overweight(278.02)   . Mitral regurgitation     Mild, echo, October, 2010  . Fluid overload   . Foot pain, right     November, 2012  . Stroke   . UTI (lower urinary tract infection)   . CKD (chronic kidney disease), stage III     Medications Prior to Admission  Medication Sig Dispense Refill  . allopurinol (ZYLOPRIM) 300 MG tablet Take 300 mg by mouth daily.     Marland Kitchen aspirin EC 81 MG tablet Take 1 tablet (81 mg  total) by mouth daily. 90 tablet 3  . atorvastatin (LIPITOR) 40 MG tablet TAKE 1 TABLET BY MOUTH EVERY DAY 90 tablet 0  . hydrochlorothiazide (HYDRODIURIL) 25 MG tablet Take 25 mg by mouth daily.     Marland Kitchen HYDROcodone-acetaminophen (NORCO/VICODIN) 5-325 MG per tablet Take 1-2 tablets by mouth every 4 (four) hours as needed for moderate pain or severe pain.     . metFORMIN (GLUCOPHAGE) 1000 MG tablet Take 1,000 mg by mouth 2 (two) times daily with a meal.     . metoprolol (LOPRESSOR) 100 MG tablet Take 100 mg by mouth 2 (two) times daily.     Marland Kitchen NITROSTAT 0.4 MG SL tablet DISSOLVE ONE TABLET UNDER TONGUE AS NEEDED FOR CHEST PAIN EVERY 5 MINUTES 25 tablet 3  . traZODone (DESYREL) 100 MG tablet Take 100 mg by mouth at bedtime as needed for sleep.     . citalopram (CELEXA) 20 MG tablet Take 20 mg by mouth daily.        Marland Kitchen allopurinol  300 mg Oral Daily  . aspirin EC  81 mg Oral Daily  . atorvastatin  40 mg Oral Daily  . citalopram  20 mg Oral Daily  . heparin  5,000 Units Subcutaneous 3 times per day  . insulin aspart  0-9 Units Subcutaneous TID WC  . metoprolol  100 mg Oral BID  . pantoprazole  40 mg Oral Q1200  . sodium chloride  3 mL Intravenous Q12H    Infusions: . sodium chloride    . nitroGLYCERIN 15 mcg/min (04/22/15 0419)    Allergies  Allergen Reactions  . Amoxicillin     Yeast infection  . Latex Itching    Social History   Social History  . Marital Status: Widowed    Spouse Name: N/A  . Number of Children: N/A  . Years of Education: N/A   Occupational History  . Not on file.   Social History Main Topics  . Smoking status: Former Smoker    Types: Cigarettes    Quit date: 11/13/2010  . Smokeless tobacco: Not on file  . Alcohol Use: Yes     Comment: Social Drinker only/////////STOP DRINKING IN 2001  . Drug Use: No  . Sexual Activity: Not on file   Other Topics Concern  .  Not on file   Social History Narrative    Family History  Problem Relation Age of Onset   . Diabetes Mother   . Hyperlipidemia Mother   . Hypertension Mother   . Heart disease Father   . Diabetes Sister   . Hypertension Sister   . Hyperlipidemia Sister   . Anesthesia problems Neg Hx     PHYSICAL EXAM: Filed Vitals:   04/22/15 0724  BP:   Pulse:   Temp: 97.8 F (36.6 C)  Resp:      Intake/Output Summary (Last 24 hours) at 04/22/15 0851 Last data filed at 04/21/15 2226  Gross per 24 hour  Intake      0 ml  Output    150 ml  Net   -150 ml    General:  Well appearing. No respiratory difficulty HEENT: normal Neck: supple. no JVD. Carotids 2+ bilat; no bruits. No lymphadenopathy or thryomegaly appreciated. Cor: PMI nondisplaced. Regular rate & rhythm. No rubs, gallops or murmurs. Lungs: clear to ausculation bilaterally. Abdomen: soft, nontender, nondistended. No hepatosplenomegaly. No bruits or masses. Good bowel sounds. Extremities: no cyanosis, clubbing, rash, edema Neuro: alert & oriented x 3, cranial nerves grossly intact. moves all 4 extremities w/o difficulty. Affect pleasant.  Results for orders placed or performed during the hospital encounter of 04/21/15 (from the past 24 hour(s))  Troponin I     Status: None   Collection Time: 04/21/15  6:41 PM  Result Value Ref Range   Troponin I <0.03 <0.031 ng/mL  CBC with Differential     Status: Abnormal   Collection Time: 04/21/15  6:41 PM  Result Value Ref Range   WBC 6.8 4.0 - 10.5 K/uL   RBC 3.64 (L) 3.87 - 5.11 MIL/uL   Hemoglobin 10.6 (L) 12.0 - 15.0 g/dL   HCT 21.3 (L) 08.6 - 57.8 %   MCV 90.9 78.0 - 100.0 fL   MCH 29.1 26.0 - 34.0 pg   MCHC 32.0 30.0 - 36.0 g/dL   RDW 46.9 62.9 - 52.8 %   Platelets 243 150 - 400 K/uL   Neutrophils Relative % 74 %   Neutro Abs 5.0 1.7 - 7.7 K/uL   Lymphocytes Relative 17 %   Lymphs Abs 1.2 0.7 - 4.0 K/uL   Monocytes Relative 6 %   Monocytes Absolute 0.4 0.1 - 1.0 K/uL   Eosinophils Relative 3 %   Eosinophils Absolute 0.2 0.0 - 0.7 K/uL   Basophils Relative 0  %   Basophils Absolute 0.0 0.0 - 0.1 K/uL  Basic metabolic panel     Status: Abnormal   Collection Time: 04/21/15  6:41 PM  Result Value Ref Range   Sodium 137 135 - 145 mmol/L   Potassium 3.5 3.5 - 5.1 mmol/L   Chloride 101 101 - 111 mmol/L   CO2 27 22 - 32 mmol/L   Glucose, Bld 117 (H) 65 - 99 mg/dL   BUN 17 6 - 20 mg/dL   Creatinine, Ser 4.13 (H) 0.44 - 1.00 mg/dL   Calcium 9.4 8.9 - 24.4 mg/dL   GFR calc non Af Amer 47 (L) >60 mL/min   GFR calc Af Amer 55 (L) >60 mL/min   Anion gap 9 5 - 15  Brain natriuretic peptide     Status: None   Collection Time: 04/21/15  6:42 PM  Result Value Ref Range   B Natriuretic Peptide 12.0 0.0 - 100.0 pg/mL  Troponin I (q 6hr x 3)  Status: None   Collection Time: 04/21/15  9:13 PM  Result Value Ref Range   Troponin I <0.03 <0.031 ng/mL  Protime-INR     Status: None   Collection Time: 04/21/15  9:13 PM  Result Value Ref Range   Prothrombin Time 14.1 11.6 - 15.2 seconds   INR 1.07 0.00 - 1.49  APTT     Status: None   Collection Time: 04/21/15  9:13 PM  Result Value Ref Range   aPTT 28 24 - 37 seconds  Type and screen     Status: None   Collection Time: 04/21/15  9:13 PM  Result Value Ref Range   ABO/RH(D) A POS    Antibody Screen NEG    Sample Expiration 04/24/2015   ABO/Rh     Status: None   Collection Time: 04/21/15  9:13 PM  Result Value Ref Range   ABO/RH(D) A POS   CBG monitoring, ED     Status: Abnormal   Collection Time: 04/21/15  9:18 PM  Result Value Ref Range   Glucose-Capillary 141 (H) 65 - 99 mg/dL  MRSA PCR Screening     Status: None   Collection Time: 04/21/15 10:31 PM  Result Value Ref Range   MRSA by PCR NEGATIVE NEGATIVE  Glucose, capillary     Status: Abnormal   Collection Time: 04/21/15 11:01 PM  Result Value Ref Range   Glucose-Capillary 164 (H) 65 - 99 mg/dL  Troponin I (q 6hr x 3)     Status: None   Collection Time: 04/22/15  3:50 AM  Result Value Ref Range   Troponin I <0.03 <0.031 ng/mL  CBC      Status: Abnormal   Collection Time: 04/22/15  3:50 AM  Result Value Ref Range   WBC 6.2 4.0 - 10.5 K/uL   RBC 3.28 (L) 3.87 - 5.11 MIL/uL   Hemoglobin 9.6 (L) 12.0 - 15.0 g/dL   HCT 40.9 (L) 81.1 - 91.4 %   MCV 90.9 78.0 - 100.0 fL   MCH 29.3 26.0 - 34.0 pg   MCHC 32.2 30.0 - 36.0 g/dL   RDW 78.2 95.6 - 21.3 %   Platelets 258 150 - 400 K/uL  Comprehensive metabolic panel     Status: Abnormal   Collection Time: 04/22/15  3:50 AM  Result Value Ref Range   Sodium 138 135 - 145 mmol/L   Potassium 3.2 (L) 3.5 - 5.1 mmol/L   Chloride 102 101 - 111 mmol/L   CO2 28 22 - 32 mmol/L   Glucose, Bld 104 (H) 65 - 99 mg/dL   BUN 16 6 - 20 mg/dL   Creatinine, Ser 0.86 (H) 0.44 - 1.00 mg/dL   Calcium 8.9 8.9 - 57.8 mg/dL   Total Protein 7.2 6.5 - 8.1 g/dL   Albumin 3.3 (L) 3.5 - 5.0 g/dL   AST 18 15 - 41 U/L   ALT 11 (L) 14 - 54 U/L   Alkaline Phosphatase 93 38 - 126 U/L   Total Bilirubin 1.1 0.3 - 1.2 mg/dL   GFR calc non Af Amer 52 (L) >60 mL/min   GFR calc Af Amer >60 >60 mL/min   Anion gap 8 5 - 15  Lipid panel     Status: Abnormal   Collection Time: 04/22/15  3:50 AM  Result Value Ref Range   Cholesterol 110 0 - 200 mg/dL   Triglycerides 469 <629 mg/dL   HDL 27 (L) >52 mg/dL   Total CHOL/HDL Ratio 4.1 RATIO  VLDL 29 0 - 40 mg/dL   LDL Cholesterol 54 0 - 99 mg/dL  Glucose, capillary     Status: None   Collection Time: 04/22/15  7:20 AM  Result Value Ref Range   Glucose-Capillary 87 65 - 99 mg/dL   Dg Chest Portable 1 View  04/21/2015   CLINICAL DATA:  Chest pain and shortness of breath intermittent for 1 week, onset today 4 hours prior.  EXAM: PORTABLE CHEST 1 VIEW  COMPARISON:  Frontal and lateral views 03/18/2013  FINDINGS: Mild elevation of right hemidiaphragm. The cardiomediastinal contours are normal. Pulmonary vasculature is normal. No consolidation, pleural effusion, or pneumothorax. No acute osseous abnormalities are seen.  IMPRESSION: No acute pulmonary process.    Electronically Signed   By: Rubye Oaks M.D.   On: 04/21/2015 18:15    ECG: Sinus bradycardia at 55 bpm.  LAFB. Non-specific t wave changes.   ASSESSMENT/PLAN: [name] reports progressive chest discomfort over the last week that has both typical and atypical features. She has several risk factors, including age, hypertension, hyperlipidemia, and known peripheral vascular disease. Given that she continues to have chest discomfort while in the hospital, we will proceed with Lexiscan Cardiolite stress testing today. Her disposition will be pending this test. Agree with continuing her home aspirin, atorvastatin, and metoprolol.  Recommended outpatient workup of her anemia.  On review of her labs she seems to be chronically hypokalemic. She likely needs to be discharged on some potassium supplementation.    Signed: Madilyn Hook, MD 04/22/2015, 8:51 AM

## 2015-04-22 NOTE — Progress Notes (Signed)
PROGRESS NOTE  [name]:[id number] DOB: 07-09-1946 DOA: 04/21/2015 PCP: Pamelia Hoit, MD  Assessment/Plan: Chest pain and CAD:  -stress test  HLD:  -LDL 54 -Continue home medications: Lipitor  Essential hypertension: -Hold HCTZ -Continue metoprolol -Hydralazine when necessary  GERD: -Protonix  OSA: -CPAP  Chronic diastolic heart failure: 2d echo on 05/16/09 showed EF of 60-65%. Patient is on HCTZ at home. CHF is compensated. BNP 12.0. -ASA and metoprolol  Gout: stable. -Continue allopurinol  CKD-III: stable. BL Cre 1.1-1.2, her creatinine is 1.14, BUN 17 on admission, which is at baseline.  DM-II: Last A1c 6.2 on 09/23/14. well controled. Patient is taking metformin at home -SSI  Depression Stable, no suicidal or homicidal ideations. -Continue home medications: Celexa  Hypokalemia -replete  Home today if stress test low risk  HPI/Subjective: No further chest pain  Objective: Filed Vitals:   04/22/15 1258  BP: 159/76  Pulse: 77  Temp:   Resp:     Intake/Output Summary (Last 24 hours) at 04/22/15 1345 Last data filed at 04/21/15 2226  Gross per 24 hour  Intake      0 ml  Output    150 ml  Net   -150 ml   Filed Weights   04/21/15 2226  Weight: 88.089 kg (194 lb 3.2 oz)    Exam:   General:  Awake, NAD  Cardiovascular: rrr  Respiratory: clear  Abdomen: +BS, soft    Data Reviewed: Basic Metabolic Panel:  Recent Labs Lab 04/21/15 1841 04/22/15 0350  NA 137 138  K 3.5 3.2*  CL 101 102  CO2 27 28  GLUCOSE 117* 104*  BUN 17 16  CREATININE 1.14* 1.05*  CALCIUM 9.4 8.9   Liver Function Tests:  Recent Labs Lab 04/22/15 0350  AST 18  ALT 11*  ALKPHOS 93  BILITOT 1.1  PROT 7.2  ALBUMIN 3.3*   No results for input(s): LIPASE, AMYLASE in the last 168 hours. No results for input(s): AMMONIA in the last 168 hours. CBC:  Recent Labs Lab 04/21/15 1841 04/22/15 0350  WBC 6.8 6.2  NEUTROABS 5.0  --   HGB 10.6*  9.6*  HCT 33.1* 29.8*  MCV 90.9 90.9  PLT 243 258   Cardiac Enzymes:  Recent Labs Lab 04/21/15 1841 04/21/15 2113 04/22/15 0350 04/22/15 0828  TROPONINI <0.03 <0.03 <0.03 <0.03   BNP (last 3 results)  Recent Labs  04/21/15 1842  BNP 12.0    ProBNP (last 3 results) No results for input(s): PROBNP in the last 8760 hours.  CBG:  Recent Labs Lab 04/21/15 2118 04/21/15 2301 04/22/15 0720  GLUCAP 141* 164* 87    Recent Results (from the past 240 hour(s))  MRSA PCR Screening     Status: None   Collection Time: 04/21/15 10:31 PM  Result Value Ref Range Status   MRSA by PCR NEGATIVE NEGATIVE Final    Comment:        The GeneXpert MRSA Assay (FDA approved for NASAL specimens only), is one component of a comprehensive MRSA colonization surveillance program. It is not intended to diagnose MRSA infection nor to guide or monitor treatment for MRSA infections.      Studies: Dg Chest Portable 1 View  04/21/2015   CLINICAL DATA:  Chest pain and shortness of breath intermittent for 1 week, onset today 4 hours prior.  EXAM: PORTABLE CHEST 1 VIEW  COMPARISON:  Frontal and lateral views 03/18/2013  FINDINGS: Mild elevation of right hemidiaphragm. The cardiomediastinal contours are normal. Pulmonary  vasculature is normal. No consolidation, pleural effusion, or pneumothorax. No acute osseous abnormalities are seen.  IMPRESSION: No acute pulmonary process.   Electronically Signed   By: Rubye Oaks M.D.   On: 04/21/2015 18:15    Scheduled Meds: . allopurinol  300 mg Oral Daily  . aspirin EC  81 mg Oral Daily  . atorvastatin  40 mg Oral Daily  . citalopram  20 mg Oral Daily  . heparin  5,000 Units Subcutaneous 3 times per day  . insulin aspart  0-9 Units Subcutaneous TID WC  . metoprolol  100 mg Oral BID  . pantoprazole  40 mg Oral Q1200  . potassium chloride  40 mEq Oral BID  . sodium chloride  3 mL Intravenous Q12H   Continuous Infusions:  Antibiotics Given (last  72 hours)    None      Principal Problem:   Chest pain Active Problems:   HLD (hyperlipidemia)   Overweight   Essential hypertension   CAD (coronary artery disease)   GERD (gastroesophageal reflux disease)   Sleep apnea   PAD (peripheral artery disease)   Diastolic CHF   Gout   CKD (chronic kidney disease), stage III   Chronic diastolic CHF (congestive heart failure)   Diabetes mellitus without complication   Depression    Time spent: 15 min    VANN, JESSICA  [facility] Pager [phone]. If 7PM-7AM, please contact night-coverage at [email or web address], password [facility] 04/22/2015, 1:45 PM  LOS: 1 day

## 2015-04-22 NOTE — Progress Notes (Addendum)
Abnormal nuc result noted:  Study Result      There was no ST segment deviation noted during stress.  T wave inversion was noted during stress in the II, III, aVF, V3, V4, V5 and V6 leads, beginning at 0 minutes of stress, ending at 5 minutes of stress, and returning to baseline after less than 1 min of recovery.  Defect 1: There is a small defect of mild severity present in the mid anteroseptal, apical anterior and apical septal location.  Defect 2: There is a small defect of mild severity present in the basal anteroseptal and basal inferoseptal location.  This is an intermediate risk study.  The left ventricular ejection fraction is normal (55-65%).  Nuclear stress EF: 65%.    Discussed with Dr. Duke Salvia, I will discuss cath with her  Signed, Azalee Course PA Pager: [phone]  Risk and benefit of procedure explained to the patient who display clear understanding and agree to proceed.  Discussed with patient possible procedural risk include bleeding, vascular injury, renal injury, arrythmia, MI, stroke and loss of limb or life.  Ramond Dial PA Pager: [phone]

## 2015-04-22 NOTE — Evaluation (Signed)
  Occupational Therapy Evaluation Patient Details Name: [name] MRN: [id number] DOB: [birth year] Today's Date: 04/22/2015    History of Present Illness [name] is a 48F with non-obstructive CAD, hypertension, hyperlipidemia, DM Type 2, OSA, PAD (bilateral SFA occlusions and mild carotid disease), MR, CVA and CKD 3 and chronic diastolic heart failure who presents with chest pain. She reports symptoms that have been getting progressively worse for the last week. It occurs both at rest as well as with stress.   Clinical Impression   Patient admitted with above. Patient independent PTA. Patient currently functioning at an overall mod I level (for increased time). D/C from acute OT services and no additional follow-up OT needs at this time. All appropriate education provided to patient. Please re-order OT if needed.      Follow Up Recommendations  No OT follow up;Supervision - Intermittent    Equipment Recommendations  None recommended by OT    Recommendations for Other Services  None at this time   Precautions / Restrictions Precautions Precautions: Fall (moderate fall risk) Restrictions Weight Bearing Restrictions: No    Mobility Bed Mobility General bed mobility comments: Pt found seated EOB upon OT entering room, in recliner upon OT exiting room.   Transfers Overall transfer level: Modified independent Equipment used: None General transfer comment: Mod I for increased time    Balance Overall balance assessment: History of Falls    ADL Overall ADL's : At baseline;Modified independent General ADL Comments: Mod I for increased time. Pt reports 3 falls in the past year or so. Recommending PT for standardized balance assessment.    Pertinent Vitals/Pain Pain Assessment: No/denies pain     Hand Dominance Right   Extremity/Trunk Assessment Upper Extremity Assessment Upper Extremity Assessment: Overall WFL for tasks assessed (recent surgery > LUE)   Lower Extremity  Assessment Lower Extremity Assessment: Defer to PT evaluation   Cervical / Trunk Assessment Cervical / Trunk Assessment: Normal   Communication Communication Communication: No difficulties   Cognition Arousal/Alertness: Awake/alert Behavior During Therapy: WFL for tasks assessed/performed Overall Cognitive Status: Within Functional Limits for tasks assessed              Home Living Family/patient expects to be discharged to:: Assisted living Home Equipment: Grab bars - tub/shower;Bedside commode   Additional Comments: Has tub/shower combo with curtain and standard height toilet seats      Prior Functioning/Environment Level of Independence: Independent     OT Diagnosis: Generalized weakness   OT Problem List:  n/a, no acute OT needs identified    OT Treatment/Interventions:   n/a, no acute OT needs identified    OT Goals(Current goals can be found in the care plan section) Acute Rehab OT Goals Patient Stated Goal: no more chest pain OT Goal Formulation: All assessment and education complete, DC therapy  OT Frequency:   n/a, no acute OT needs identified    Barriers to D/C:  None known at this time    End of Session Activity Tolerance: Patient tolerated treatment well Patient left: in chair;with call bell/phone within reach   Time: 1410-1422 OT Time Calculation (min): 12 min Charges:  OT General Charges $OT Visit: 1 Procedure OT Evaluation $Initial OT Evaluation Tier I: 1 Procedure  CLAY,PATRICIA , MS, OTR/L, CLT Pager: [phone]  04/22/2015, 2:36 PM

## 2015-04-22 NOTE — Progress Notes (Addendum)
UR Completed Brenda Graves-Bigelow, RN,BSN [phone]  

## 2015-04-22 NOTE — Progress Notes (Signed)
Pt on CPAP at this time, pt stated that she wear 30 of pressure at home and stated that was her settings. I explained to pt 30 of pressure is an significant amount of pressure. Education given on inner chest wall pressure etc. I explained to pt I would start her pressure at 10 and titrate it up if tolerated. Titrated pressure to 12 tolerated well, 14, tolerated well, 18 of pressure pt stated that was too much. I placed pt pressure at 13 and she tolerating it well. No complications noted, o2 saturations are stable. Pt is on a nasal mask and is stable.

## 2015-04-23 ENCOUNTER — Encounter (HOSPITAL_COMMUNITY)
Admission: EM | Disposition: A | Discharge status: Home / Self Care | Payer: Medicare Other | Source: Home / Self Care | Attending: Internal Medicine

## 2015-04-23 DIAGNOSIS — R931 Abnormal findings on diagnostic imaging of heart and coronary circulation: Secondary | ICD-10-CM

## 2015-04-23 DIAGNOSIS — N183 Chronic kidney disease, stage 3 (moderate): Secondary | ICD-10-CM

## 2015-04-23 HISTORY — PX: CARDIAC CATHETERIZATION: SHX172

## 2015-04-23 LAB — BASIC METABOLIC PANEL
Anion gap: 6 (ref 5–15)
BUN: 17 mg/dL (ref 6–20)
CALCIUM: 8.7 mg/dL — AB (ref 8.9–10.3)
CO2: 27 mmol/L (ref 22–32)
CREATININE: 1.22 mg/dL — AB (ref 0.44–1.00)
Chloride: 103 mmol/L (ref 101–111)
GFR, EST AFRICAN AMERICAN: 50 mL/min — AB (ref >60)
GFR, EST NON AFRICAN AMERICAN: 43 mL/min — AB (ref >60)
Glucose, Bld: 125 mg/dL — ABNORMAL HIGH (ref 65–99)
Potassium: 4.3 mmol/L (ref 3.5–5.1)
SODIUM: 136 mmol/L (ref 135–145)

## 2015-04-23 LAB — CBC
HCT: 30.5 % — ABNORMAL LOW (ref 36.0–46.0)
Hemoglobin: 9.6 g/dL — ABNORMAL LOW (ref 12.0–15.0)
MCH: 28.7 pg (ref 26.0–34.0)
MCHC: 31.5 g/dL (ref 30.0–36.0)
MCV: 91 fL (ref 78.0–100.0)
PLATELETS: 246 10*3/uL (ref 150–400)
RBC: 3.35 MIL/uL — AB (ref 3.87–5.11)
RDW: 13.9 % (ref 11.5–15.5)
WBC: 5.7 10*3/uL (ref 4.0–10.5)

## 2015-04-23 LAB — GLUCOSE, CAPILLARY
GLUCOSE-CAPILLARY: 100 mg/dL — AB (ref 65–99)
GLUCOSE-CAPILLARY: 109 mg/dL — AB (ref 65–99)
GLUCOSE-CAPILLARY: 130 mg/dL — AB (ref 65–99)

## 2015-04-23 LAB — HEMOGLOBIN A1C
HEMOGLOBIN A1C: 6.7 % — AB (ref 4.8–5.6)
MEAN PLASMA GLUCOSE: 146 mg/dL

## 2015-04-23 SURGERY — LEFT HEART CATH AND CORONARY ANGIOGRAPHY
Anesthesia: LOCAL

## 2015-04-23 MED ORDER — SODIUM CHLORIDE 0.9 % WEIGHT BASED INFUSION: 1.0000 mL/kg/h | INTRAVENOUS | Status: AC

## 2015-04-23 MED ORDER — SODIUM CHLORIDE 0.9 % IV SOLN: 250.0000 mL | INTRAVENOUS | Status: DC | PRN

## 2015-04-23 MED ORDER — MIDAZOLAM HCL 2 MG/2ML IJ SOLN
INTRAMUSCULAR | Status: AC
  Filled 2015-04-23: qty 4

## 2015-04-23 MED ORDER — FENTANYL CITRATE (PF) 100 MCG/2ML IJ SOLN
INTRAMUSCULAR | Status: AC
  Filled 2015-04-23: qty 4

## 2015-04-23 MED ORDER — ACETAMINOPHEN 325 MG PO TABS: 650.0000 mg | ORAL_TABLET | ORAL | Status: DC | PRN

## 2015-04-23 MED ORDER — SODIUM CHLORIDE 0.9 % IJ SOLN: 3.0000 mL | Freq: Two times a day (BID) | INTRAMUSCULAR | Status: DC

## 2015-04-23 MED ORDER — ONDANSETRON HCL 4 MG/2ML IJ SOLN: 4.0000 mg | Freq: Four times a day (QID) | INTRAMUSCULAR | Status: DC | PRN

## 2015-04-23 MED ORDER — FENTANYL CITRATE (PF) 100 MCG/2ML IJ SOLN
INTRAMUSCULAR | Status: DC | PRN
  Administered 2015-04-23: 25 ug via INTRAVENOUS

## 2015-04-23 MED ORDER — HEPARIN (PORCINE) IN NACL 2-0.9 UNIT/ML-% IJ SOLN
INTRAMUSCULAR | Status: AC
  Filled 2015-04-23: qty 1000

## 2015-04-23 MED ORDER — LIDOCAINE HCL (PF) 1 % IJ SOLN
INTRAMUSCULAR | Status: AC
  Filled 2015-04-23: qty 30

## 2015-04-23 MED ORDER — HEPARIN SODIUM (PORCINE) 1000 UNIT/ML IJ SOLN
INTRAMUSCULAR | Status: AC
  Filled 2015-04-23: qty 1

## 2015-04-23 MED ORDER — SODIUM CHLORIDE 0.9 % IJ SOLN: 3.0000 mL | INTRAMUSCULAR | Status: DC | PRN

## 2015-04-23 MED ORDER — MIDAZOLAM HCL 2 MG/2ML IJ SOLN
INTRAMUSCULAR | Status: DC | PRN
  Administered 2015-04-23: 2 mg via INTRAVENOUS

## 2015-04-23 MED ORDER — HEPARIN SODIUM (PORCINE) 1000 UNIT/ML IJ SOLN
INTRAMUSCULAR | Status: DC | PRN
Start: 2015-04-23 — End: 2015-04-23
  Administered 2015-04-23: 4500 [IU] via INTRAVENOUS

## 2015-04-23 MED ORDER — VERAPAMIL HCL 2.5 MG/ML IV SOLN
INTRAVENOUS | Status: AC
  Filled 2015-04-23: qty 2

## 2015-04-23 MED ORDER — NITROGLYCERIN 1 MG/10 ML FOR IR/CATH LAB
INTRA_ARTERIAL | Status: AC
  Filled 2015-04-23: qty 10

## 2015-04-23 SURGICAL SUPPLY — 9 items
CATH INFINITI 5 FR JL3.5 (CATHETERS) ×2 IMPLANT
CATH INFINITI JR4 5F (CATHETERS) ×2 IMPLANT
DEVICE RAD COMP TR BAND LRG (VASCULAR PRODUCTS) ×2 IMPLANT
GLIDESHEATH SLEND SS 6F .021 (SHEATH) ×2 IMPLANT
KIT HEART LEFT (KITS) ×2 IMPLANT
PACK CARDIAC CATHETERIZATION (CUSTOM PROCEDURE TRAY) ×2 IMPLANT
TRANSDUCER W/STOPCOCK (MISCELLANEOUS) ×2 IMPLANT
TUBING CIL FLEX 10 FLL-RA (TUBING) ×2 IMPLANT
WIRE SAFE-T 1.5MM-J .035X260CM (WIRE) ×2 IMPLANT

## 2015-04-23 NOTE — Discharge Summary (Signed)
Physician Discharge Summary  [name]:[id number] DOB: 17-Oct-1945 DOA: 04/21/2015  PCP: Pamelia Hoit, MD  Admit date: 04/21/2015 Discharge date: 04/24/2015  Time spent: 35 minutes  Recommendations for Outpatient Follow-up:  BMP 1 week  Discharge Diagnoses:  Principal Problem:   Chest pain Active Problems:   HLD (hyperlipidemia)   Overweight   Essential hypertension   CAD (coronary artery disease)   GERD (gastroesophageal reflux disease)   Sleep apnea   PAD (peripheral artery disease)   Diastolic CHF   Gout   CKD (chronic kidney disease), stage III   Chronic diastolic CHF (congestive heart failure)   Diabetes mellitus without complication   Depression   Abnormal nuclear cardiac imaging test   Discharge Condition: improved  Diet recommendation: cardiac/diabetic  Filed Weights   04/21/15 2226  Weight: 88.089 kg (194 lb 3.2 oz)    History of present illness:  [name] is a 69 y.o. female with PMH of CAD, hypertension, hyperlipidemia, diabetes mellitus, GERD, L2, OSA, PAD, mitral valve regurgitation, history of stroke, diastolic congestive heart failure (EF 60-65%), CKD-III, who presents with chest pain.  Patient reports that she has been having chest pain in the past 7 days. Her chest pain is located in the substernal area, constant, intermittently accentuated, radiating to the left chest. Highest level of chest pain was 7/10 severity, currently is 3 out of 10 in severity on nitroglycerin drip. Patient does not have cough, fever or chills. No tenderness over calf areas. She has chronic bilateral leg pain due to PAD, which has not changed. Patient does not have abdominal pain, nausea, vomiting, symptoms of UTI, unilateral weakness.   In ED, patient was found to have negative troponin, CBC 6.8, temperature normal, bradycardia, stable renal function, negative chest x-ray. Patient is admitted to inpatient for further evaluation and treatment.  Hospital Course:   Chest pain and CAD:  -stress test + S/p cath- per cards PA, ok to d/c  HLD:  -LDL 54 -Continue home medications: Lipitor  Essential hypertension: -Hold HCTZ -Continue metoprolol -Hydralazine when necessary  GERD: -Protonix  OSA: -CPAP  Chronic diastolic heart failure: 2d echo on 05/16/09 showed EF of 60-65%. Patient is on HCTZ at home. CHF is compensated. BNP 12.0. -ASA and metoprolol  Gout: stable. -Continue allopurinol  CKD-III: stable. BL Cre 1.1-1.2, her creatinine is 1.14, BUN 17 on admission, which is at baseline.  DM-II: Last A1c 6.2 on 09/23/14. well controled. Patient is taking metformin at home -SSI  Depression Stable, no suicidal or homicidal ideations. -Continue home medications: Celexa  Hypokalemia -replete  Procedures:  Cath:Mild, non-obstructive CAD.  Normal LVEDP.   Consultations:  cards  Discharge Exam: Filed Vitals:   04/23/15 1545  BP: 139/72  Pulse: 54  Temp:   Resp:     General: awake, NAD   Discharge Instructions   Discharge Instructions    Diet - low sodium heart healthy    Complete by:  As directed      Diet Carb Modified    Complete by:  As directed      Discharge instructions    Complete by:  As directed   BMP 1 week re Cr     Increase activity slowly    Complete by:  As directed           Discharge Medication List as of 04/23/2015  5:58 PM    CONTINUE these medications which have NOT CHANGED   Details  allopurinol (ZYLOPRIM) 300 MG tablet Take 300  mg by mouth daily. , Starting 11/20/2013, Until Discontinued, Historical Med    aspirin EC 81 MG tablet Take 1 tablet (81 mg total) by mouth daily., Starting 02/27/2014, Until Discontinued, OTC    atorvastatin (LIPITOR) 40 MG tablet TAKE 1 TABLET BY MOUTH EVERY DAY, Normal    HYDROcodone-acetaminophen (NORCO/VICODIN) 5-325 MG per tablet Take 1-2 tablets by mouth every 4 (four) hours as needed for moderate pain or severe pain. , Starting 10/31/2014, Until Discontinued,  Historical Med    metFORMIN (GLUCOPHAGE) 1000 MG tablet Take 1,000 mg by mouth 2 (two) times daily with a meal. , Starting 01/14/2014, Until Discontinued, Historical Med    metoprolol (LOPRESSOR) 100 MG tablet Take 100 mg by mouth 2 (two) times daily. , Starting 01/09/2014, Until Discontinued, Historical Med    NITROSTAT 0.4 MG SL tablet DISSOLVE ONE TABLET UNDER TONGUE AS NEEDED FOR CHEST PAIN EVERY 5 MINUTES, Normal    traZODone (DESYREL) 100 MG tablet Take 100 mg by mouth at bedtime as needed for sleep. , Starting 09/10/2014, Until Discontinued, Historical Med    citalopram (CELEXA) 20 MG tablet Take 20 mg by mouth daily. , Until Discontinued, Historical Med      STOP taking these medications     hydrochlorothiazide (HYDRODIURIL) 25 MG tablet        Allergies  Allergen Reactions  . Amoxicillin     Yeast infection  . Latex Itching   Follow-up Information    Follow up with [facility], NP On 05/05/2015.   Specialties:  Cardiology, Radiology   Why:  :30 for cardiology follow up   Contact information:   [address] [place] [address] [phone]       Follow up with Pamelia Hoit, MD In 1 week.   Specialty:  Family Medicine   Why:  BMP   Contact information:   [address] [place] [address] [phone]        The results of significant diagnostics from this hospitalization (including imaging, microbiology, ancillary and laboratory) are listed below for reference.    Significant Diagnostic Studies: Nm Myocar Multi W/spect W/wall Motion / Ef  04/22/2015    There was no ST segment deviation noted during stress.  T wave inversion was noted during stress in the II, III, aVF, V3, V4, V5  and V6 leads, beginning at 0 minutes of stress, ending at 5 minutes of  stress, and returning to baseline after less than 1 min of recovery.  Defect 1: There is a small defect of mild severity present in the mid  anteroseptal, apical anterior and apical  septal location.  Defect 2: There is a small defect of mild severity present in the basal  anteroseptal and basal inferoseptal location.  This is an intermediate risk study.  The left ventricular ejection fraction is normal (55-65%).  Nuclear stress EF: 65%.    Dg Chest Portable 1 View  04/21/2015   CLINICAL DATA:  Chest pain and shortness of breath intermittent for 1 week, onset today 4 hours prior.  EXAM: PORTABLE CHEST 1 VIEW  COMPARISON:  Frontal and lateral views 03/18/2013  FINDINGS: Mild elevation of right hemidiaphragm. The cardiomediastinal contours are normal. Pulmonary vasculature is normal. No consolidation, pleural effusion, or pneumothorax. No acute osseous abnormalities are seen.  IMPRESSION: No acute pulmonary process.   Electronically Signed   By: Rubye Oaks M.D.   On: 04/21/2015 18:15    Microbiology: Recent Results (from the past 240 hour(s))  MRSA PCR Screening     Status: None   Collection Time: 04/21/15 10:31 PM  Result Value Ref Range Status   MRSA by PCR NEGATIVE NEGATIVE Final    Comment:        The GeneXpert MRSA Assay (FDA approved for NASAL specimens only), is one component of a comprehensive MRSA colonization surveillance program. It is not intended to diagnose MRSA infection nor to guide or monitor treatment for MRSA infections.      Labs: Basic Metabolic Panel:  Recent Labs Lab 04/21/15 1841 04/22/15 0350 04/23/15 0445  NA 137 138 136  K 3.5 3.2* 4.3  CL 101 102 103  CO2 GLUCOSE 117* 104* 125*  BUN CREATININE 1.14* 1.05* 1.22*  CALCIUM 9.4 8.9 8.7*   Liver Function Tests:  Recent Labs Lab 04/22/15 0350  AST 18  ALT 11*  ALKPHOS 93  BILITOT 1.1  PROT 7.2  ALBUMIN 3.3*   No results for input(s): LIPASE, AMYLASE in the last 168 hours. No results for input(s): AMMONIA in the last 168 hours. CBC:  Recent Labs Lab 04/21/15 1841 04/22/15 0350 04/23/15 0445  WBC 6.8 6.2 5.7  NEUTROABS 5.0  --   --    HGB 10.6* 9.6* 9.6*  HCT 33.1* 29.8* 30.5*  MCV 90.9 90.9 91.0  PLT 243 258 246   Cardiac Enzymes:  Recent Labs Lab 04/21/15 1841 04/21/15 2113 04/22/15 0350 04/22/15 0828  TROPONINI <0.03 <0.03 <0.03 <0.03   BNP: BNP (last 3 results)  Recent Labs  04/21/15 1842  BNP 12.0    ProBNP (last 3 results) No results for input(s): PROBNP in the last 8760 hours.  CBG:  Recent Labs Lab 04/22/15 1607 04/22/15 2111 04/23/15 0743 04/23/15 1125 04/23/15 1628  GLUCAP 184* 99 100* 109* 130*       Signed:  VANN, JESSICA  [facility] 04/24/2015, 1:19 PM

## 2015-04-23 NOTE — Progress Notes (Signed)
 Patient Name: [name] Date of Encounter: 04/23/2015  Principal Problem:   Chest pain Active Problems:   HLD (hyperlipidemia)   Overweight   Essential hypertension   CAD (coronary artery disease)   GERD (gastroesophageal reflux disease)   Sleep apnea   PAD (peripheral artery disease)   Diastolic CHF   Gout   CKD (chronic kidney disease), stage III   Chronic diastolic CHF (congestive heart failure)   Diabetes mellitus without complication   Depression    SUBJECTIVE  Still have some CP, however improved. For cath today.   CURRENT MEDS . allopurinol  300 mg Oral Daily  . aspirin EC  81 mg Oral Daily  . atorvastatin  40 mg Oral Daily  . citalopram  20 mg Oral Daily  . heparin  5,000 Units Subcutaneous 3 times per day  . insulin aspart  0-9 Units Subcutaneous TID WC  . metoprolol  100 mg Oral BID  . pantoprazole  40 mg Oral Q1200  . sodium chloride  3 mL Intravenous Q12H  . sodium chloride  3 mL Intravenous Q12H    OBJECTIVE  Filed Vitals:   04/22/15 1539 04/22/15 2010 04/22/15 2312 04/23/15 0625  BP:  146/56  108/45  Pulse: 80 59 65 56  Temp:  98.7 F (37.1 C)  98.4 F (36.9 C)  TempSrc:  Oral  Oral  Resp:  18 18 18  Height:      Weight:      SpO2:  99% 99% 100%   No intake or output data in the 24 hours ending 04/23/15 1009 Filed Weights   04/21/15 2226  Weight: 194 lb 3.2 oz (88.089 kg)    PHYSICAL EXAM  General: Pleasant, NAD. Neuro: Alert and oriented X 3. Moves all extremities spontaneously. Psych: Normal affect. HEENT:  Normal  Neck: Supple without bruits or JVD. Lungs:  Resp regular and unlabored, CTA. Heart: RRR no s3, s4, or murmurs. Abdomen: Soft, non-tender, non-distended, BS + x 4.  Extremities: No clubbing, cyanosis or edema. DP/PT/Radials 2+ and equal bilaterally.  Accessory Clinical Findings  CBC  Recent Labs  04/21/15 1841 04/22/15 0350 04/23/15 0445  WBC 6.8 6.2 5.7  NEUTROABS 5.0  --   --   HGB 10.6* 9.6* 9.6*    HCT 33.1* 29.8* 30.5*  MCV 90.9 90.9 91.0  PLT 243 258 246   Basic Metabolic Panel  Recent Labs  04/22/15 0350 04/23/15 0445  NA 138 136  K 3.2* 4.3  CL 102 103  CO2 28 27  GLUCOSE 104* 125*  BUN 16 17  CREATININE 1.05* 1.22*  CALCIUM 8.9 8.7*   Liver Function Tests  Recent Labs  04/22/15 0350  AST 18  ALT 11*  ALKPHOS 93  BILITOT 1.1  PROT 7.2  ALBUMIN 3.3*   No results for input(s): LIPASE, AMYLASE in the last 72 hours. Cardiac Enzymes  Recent Labs  04/21/15 2113 04/22/15 0350 04/22/15 0828  TROPONINI <0.03 <0.03 <0.03   Hemoglobin A1C  Recent Labs  04/22/15 0350  HGBA1C 6.7*   Fasting Lipid Panel  Recent Labs  04/22/15 0350  CHOL 110  HDL 27*  LDLCALC 54  TRIG 143  CHOLHDL 4.1    TELE  Sinus rhythm  Radiology/Studies  Nm Myocar Multi W/spect W/wall Motion / Ef  04/22/2015    There was no ST segment deviation noted during stress.  T wave inversion was noted during stress in the II, III, aVF, V3, V4, V5  and V6   leads, beginning at 0 minutes of stress, ending at 5 minutes of  stress, and returning to baseline after less than 1 min of recovery.  Defect 1: There is a small defect of mild severity present in the mid  anteroseptal, apical anterior and apical septal location.  Defect 2: There is a small defect of mild severity present in the basal  anteroseptal and basal inferoseptal location.  This is an intermediate risk study.  The left ventricular ejection fraction is normal (55-65%).  Nuclear stress EF: 65%.    Dg Chest Portable 1 View  04/21/2015   CLINICAL DATA:  Chest pain and shortness of breath intermittent for 1 week, onset today 4 hours prior.  EXAM: PORTABLE CHEST 1 VIEW  COMPARISON:  Frontal and lateral views 03/18/2013  FINDINGS: Mild elevation of right hemidiaphragm. The cardiomediastinal contours are normal. Pulmonary vasculature is normal. No consolidation, pleural effusion, or pneumothorax. No acute osseous abnormalities are  seen.  IMPRESSION: No acute pulmonary process.   Electronically Signed   By: Melanie  Ehinger M.D.   On: 04/21/2015 18:15   Myoview 04/22/15 Study Result      There was no ST segment deviation noted during stress.  T wave inversion was noted during stress in the II, III, aVF, V3, V4, V5 and V6 leads, beginning at 0 minutes of stress, ending at 5 minutes of stress, and returning to baseline after less than 1 min of recovery.  Defect 1: There is a small defect of mild severity present in the mid anteroseptal, apical anterior and apical septal location.  Defect 2: There is a small defect of mild severity present in the basal anteroseptal and basal inferoseptal location.  This is an intermediate risk study.  The left ventricular ejection fraction is normal (55-65%).  Nuclear stress EF: 65%.        ASSESSMENT AND PLAN  1. Atypical chest pain - With both typical and atypical features - Myoview intermediate risk study, For cath today.  - Continue ASA, Statin, BB - Cr 1.22 (watch after cath)   2. HL - LDL 54, continue statin  3. OSA - On CPAP  4. CKD,stage III - Cre 1.1-1.2. Cr of 1.22 this morning, follow closely  5. Chronic diastolic CHF - Echo 04/22/15 LV EF of 65-65%, grade 1 DD, mild pulmonic valve regur, no WM abnormality - Appears Euvolemic - On HCTZ at home, held here  6. Hypokalemia - Resolved  Signed, Bhagat,Bhavinkumar PA-C   Agree with note by Vin Bhagat PA-C  Admitted wth [place]. Enz neg. MV + , intermediate risk. Cath 2006 nonobstructive CAD. Daily CP for the past week. Labs OK. Plan cath today.   Jonathan J. Berry, M.D., FACP, FACC, FAHA, FSCAI [facility] [address] [place], [place]  [address]  [phone] 04/23/2015 10:18 AM    

## 2015-04-23 NOTE — Interval H&P Note (Signed)
Cath Lab Visit (complete for each Cath Lab visit)  Clinical Evaluation Leading to the Procedure:   ACS: Yes.    Non-ACS:    Anginal Classification: CCS IV  Anti-ischemic medical therapy: Minimal Therapy (1 class of medications)  Non-Invasive Test Results: Intermediate-risk stress test findings: cardiac mortality 1-3%/year  Prior CABG: No previous CABG      History and Physical Interval Note:  04/23/2015 12:54 PM  [name]  has presented today for surgery, with the diagnosis of abnormal myoview  The various methods of treatment have been discussed with the patient and family. After consideration of risks, benefits and other options for treatment, the patient has consented to  Procedure(s): Left Heart Cath and Coronary Angiography (N/A) as a surgical intervention .  The patient's history has been reviewed, patient examined, no change in status, stable for surgery.  I have reviewed the patient's chart and labs.  Questions were answered to the patient's satisfaction.     VARANASI,JAYADEEP S.

## 2015-04-23 NOTE — H&P (View-Only) (Signed)
Patient Name: ANIESHA HAUGHN Date of Encounter: 04/23/2015  Principal Problem:   Chest pain Active Problems:   HLD (hyperlipidemia)   Overweight   Essential hypertension   CAD (coronary artery disease)   GERD (gastroesophageal reflux disease)   Sleep apnea   PAD (peripheral artery disease)   Diastolic CHF   Gout   CKD (chronic kidney disease), stage III   Chronic diastolic CHF (congestive heart failure)   Diabetes mellitus without complication   Depression    SUBJECTIVE  Still have some CP, however improved. For cath today.   CURRENT MEDS . allopurinol  300 mg Oral Daily  . aspirin EC  81 mg Oral Daily  . atorvastatin  40 mg Oral Daily  . citalopram  20 mg Oral Daily  . heparin  5,000 Units Subcutaneous 3 times per day  . insulin aspart  0-9 Units Subcutaneous TID WC  . metoprolol  100 mg Oral BID  . pantoprazole  40 mg Oral Q1200  . sodium chloride  3 mL Intravenous Q12H  . sodium chloride  3 mL Intravenous Q12H    OBJECTIVE  Filed Vitals:   04/22/15 1539 04/22/15 2010 04/22/15 2312 04/23/15 0625  BP:  146/56  108/45  Pulse: 80 59 65 56  Temp:  98.7 F (37.1 C)  98.4 F (36.9 C)  TempSrc:  Oral  Oral  Resp:  Height:      Weight:      SpO2:  99% 99% 100%   No intake or output data in the 24 hours ending 04/23/15 1009 Filed Weights   04/21/15 2226  Weight: 194 lb 3.2 oz (88.089 kg)    PHYSICAL EXAM  General: Pleasant, NAD. Neuro: Alert and oriented X 3. Moves all extremities spontaneously. Psych: Normal affect. HEENT:  Normal  Neck: Supple without bruits or JVD. Lungs:  Resp regular and unlabored, CTA. Heart: RRR no s3, s4, or murmurs. Abdomen: Soft, non-tender, non-distended, BS + x 4.  Extremities: No clubbing, cyanosis or edema. DP/PT/Radials 2+ and equal bilaterally.  Accessory Clinical Findings  CBC  Recent Labs  04/21/15 1841 04/22/15 0350 04/23/15 0445  WBC 6.8 6.2 5.7  NEUTROABS 5.0  --   --   HGB 10.6* 9.6* 9.6*    HCT 33.1* 29.8* 30.5*  MCV 90.9 90.9 91.0  PLT 243 258 246   Basic Metabolic Panel  Recent Labs  04/22/15 0350 04/23/15 0445  NA 138 136  K 3.2* 4.3  CL 102 103  CO2 28 27  GLUCOSE 104* 125*  BUN 16 17  CREATININE 1.05* 1.22*  CALCIUM 8.9 8.7*   Liver Function Tests  Recent Labs  04/22/15 0350  AST 18  ALT 11*  ALKPHOS 93  BILITOT 1.1  PROT 7.2  ALBUMIN 3.3*   No results for input(s): LIPASE, AMYLASE in the last 72 hours. Cardiac Enzymes  Recent Labs  04/21/15 2113 04/22/15 0350 04/22/15 0828  TROPONINI <0.03 <0.03 <0.03   Hemoglobin A1C  Recent Labs  04/22/15 0350  HGBA1C 6.7*   Fasting Lipid Panel  Recent Labs  04/22/15 0350  CHOL 110  HDL 27*  LDLCALC 54  TRIG 161  CHOLHDL 4.1    TELE  Sinus rhythm  Radiology/Studies  Nm Myocar Multi W/spect W/wall Motion / Ef  04/22/2015    There was no ST segment deviation noted during stress.  T wave inversion was noted during stress in the II, III, aVF, V3, V4, V5  and V6  leads, beginning at 0 minutes of stress, ending at 5 minutes of  stress, and returning to baseline after less than 1 min of recovery.  Defect 1: There is a small defect of mild severity present in the mid  anteroseptal, apical anterior and apical septal location.  Defect 2: There is a small defect of mild severity present in the basal  anteroseptal and basal inferoseptal location.  This is an intermediate risk study.  The left ventricular ejection fraction is normal (55-65%).  Nuclear stress EF: 65%.    Dg Chest Portable 1 View  04/21/2015   CLINICAL DATA:  Chest pain and shortness of breath intermittent for 1 week, onset today 4 hours prior.  EXAM: PORTABLE CHEST 1 VIEW  COMPARISON:  Frontal and lateral views 03/18/2013  FINDINGS: Mild elevation of right hemidiaphragm. The cardiomediastinal contours are normal. Pulmonary vasculature is normal. No consolidation, pleural effusion, or pneumothorax. No acute osseous abnormalities are  seen.  IMPRESSION: No acute pulmonary process.   Electronically Signed   By: Rubye Oaks M.D.   On: 04/21/2015 18:15   Myoview 04/22/15 Study Result      There was no ST segment deviation noted during stress.  T wave inversion was noted during stress in the II, III, aVF, V3, V4, V5 and V6 leads, beginning at 0 minutes of stress, ending at 5 minutes of stress, and returning to baseline after less than 1 min of recovery.  Defect 1: There is a small defect of mild severity present in the mid anteroseptal, apical anterior and apical septal location.  Defect 2: There is a small defect of mild severity present in the basal anteroseptal and basal inferoseptal location.  This is an intermediate risk study.  The left ventricular ejection fraction is normal (55-65%).  Nuclear stress EF: 65%.        ASSESSMENT AND PLAN  1. Atypical chest pain - With both typical and atypical features - Myoview intermediate risk study, For cath today.  - Continue ASA, Statin, BB - Cr 1.22 (watch after cath)   2. HL - LDL 54, continue statin  3. OSA - On CPAP  4. CKD,stage III - Cre 1.1-1.2. Cr of 1.22 this morning, follow closely  5. Chronic diastolic CHF - Echo 04/22/15 LV EF of 65-65%, grade 1 DD, mild pulmonic valve regur, no WM abnormality - Appears Euvolemic - On HCTZ at home, held here  6. Hypokalemia - Resolved  Signed, Bhagat,Bhavinkumar PA-C   Agree with note by Chelsea Aus PA-C  Admitted wth [place]. Enz neg. MV + , intermediate risk. Cath 2006 nonobstructive CAD. Daily CP for the past week. Labs OK. Plan cath today.   Runell Gess, M.D., FACP, [facility], [facility] [address] [place], [place]  [address]  [phone] 04/23/2015 10:18 AM

## 2015-04-23 NOTE — Progress Notes (Signed)
PROGRESS NOTE  [name]:[id number] DOB: 02-27-1946 DOA: 04/21/2015 PCP: Pamelia Hoit, MD  Assessment/Plan: Chest pain and CAD:  -stress test + For cath today  HLD:  -LDL 54 -Continue home medications: Lipitor  Essential hypertension: -Hold HCTZ -Continue metoprolol -Hydralazine when necessary  GERD: -Protonix  OSA: -CPAP  Chronic diastolic heart failure: 2d echo on 05/16/09 showed EF of 60-65%. Patient is on HCTZ at home. CHF is compensated. BNP 12.0. -ASA and metoprolol  Gout: stable. -Continue allopurinol  CKD-III: stable. BL Cre 1.1-1.2, her creatinine is 1.14, BUN 17 on admission, which is at baseline.  DM-II: Last A1c 6.2 on 09/23/14. well controled. Patient is taking metformin at home -SSI  Depression Stable, no suicidal or homicidal ideations. -Continue home medications: Celexa  Hypokalemia -replete  Watch Cr in AM after cath   HPI/Subjective: No further chest pain Up walking in room  Objective: Filed Vitals:   04/23/15 0625  BP: 108/45  Pulse: 56  Temp: 98.4 F (36.9 C)  Resp: 18   No intake or output data in the 24 hours ending 04/23/15 1107 Filed Weights   04/21/15 2226  Weight: 88.089 kg (194 lb 3.2 oz)    Exam:   General:  Awake, NAD  Cardiovascular: rrr  Respiratory: clear  Abdomen: +BS, soft    Data Reviewed: Basic Metabolic Panel:  Recent Labs Lab 04/21/15 1841 04/22/15 0350 04/23/15 0445  NA 137 138 136  K 3.5 3.2* 4.3  CL 101 102 103  CO2 GLUCOSE 117* 104* 125*  BUN CREATININE 1.14* 1.05* 1.22*  CALCIUM 9.4 8.9 8.7*   Liver Function Tests:  Recent Labs Lab 04/22/15 0350  AST 18  ALT 11*  ALKPHOS 93  BILITOT 1.1  PROT 7.2  ALBUMIN 3.3*   No results for input(s): LIPASE, AMYLASE in the last 168 hours. No results for input(s): AMMONIA in the last 168 hours. CBC:  Recent Labs Lab 04/21/15 1841 04/22/15 0350 04/23/15 0445  WBC 6.8 6.2 5.7  NEUTROABS 5.0  --    --   HGB 10.6* 9.6* 9.6*  HCT 33.1* 29.8* 30.5*  MCV 90.9 90.9 91.0  PLT 243 258 246   Cardiac Enzymes:  Recent Labs Lab 04/21/15 1841 04/21/15 2113 04/22/15 0350 04/22/15 0828  TROPONINI <0.03 <0.03 <0.03 <0.03   BNP (last 3 results)  Recent Labs  04/21/15 1842  BNP 12.0    ProBNP (last 3 results) No results for input(s): PROBNP in the last 8760 hours.  CBG:  Recent Labs Lab 04/21/15 2301 04/22/15 0720 04/22/15 1607 04/22/15 2111 04/23/15 0743  GLUCAP 164* 87 184* 99 100*    Recent Results (from the past 240 hour(s))  MRSA PCR Screening     Status: None   Collection Time: 04/21/15 10:31 PM  Result Value Ref Range Status   MRSA by PCR NEGATIVE NEGATIVE Final    Comment:        The GeneXpert MRSA Assay (FDA approved for NASAL specimens only), is one component of a comprehensive MRSA colonization surveillance program. It is not intended to diagnose MRSA infection nor to guide or monitor treatment for MRSA infections.      Studies: Nm Myocar Multi W/spect W/wall Motion / Ef  04/22/2015    There was no ST segment deviation noted during stress.  T wave inversion was noted during stress in the II, III, aVF, V3, V4, V5  and V6 leads, beginning at 0 minutes of stress, ending  at 5 minutes of  stress, and returning to baseline after less than 1 min of recovery.  Defect 1: There is a small defect of mild severity present in the mid  anteroseptal, apical anterior and apical septal location.  Defect 2: There is a small defect of mild severity present in the basal  anteroseptal and basal inferoseptal location.  This is an intermediate risk study.  The left ventricular ejection fraction is normal (55-65%).  Nuclear stress EF: 65%.    Dg Chest Portable 1 View  04/21/2015   CLINICAL DATA:  Chest pain and shortness of breath intermittent for 1 week, onset today 4 hours prior.  EXAM: PORTABLE CHEST 1 VIEW  COMPARISON:  Frontal and lateral views 03/18/2013  FINDINGS:  Mild elevation of right hemidiaphragm. The cardiomediastinal contours are normal. Pulmonary vasculature is normal. No consolidation, pleural effusion, or pneumothorax. No acute osseous abnormalities are seen.  IMPRESSION: No acute pulmonary process.   Electronically Signed   By: Rubye Oaks M.D.   On: 04/21/2015 18:15    Scheduled Meds: . allopurinol  300 mg Oral Daily  . aspirin EC  81 mg Oral Daily  . atorvastatin  40 mg Oral Daily  . citalopram  20 mg Oral Daily  . heparin  5,000 Units Subcutaneous 3 times per day  . insulin aspart  0-9 Units Subcutaneous TID WC  . metoprolol  100 mg Oral BID  . pantoprazole  40 mg Oral Q1200  . sodium chloride  3 mL Intravenous Q12H  . sodium chloride  3 mL Intravenous Q12H   Continuous Infusions: . sodium chloride 1 mL/kg/hr (04/23/15 0700)   Antibiotics Given (last 72 hours)    None      Principal Problem:   Chest pain Active Problems:   HLD (hyperlipidemia)   Overweight   Essential hypertension   CAD (coronary artery disease)   GERD (gastroesophageal reflux disease)   Sleep apnea   PAD (peripheral artery disease)   Diastolic CHF   Gout   CKD (chronic kidney disease), stage III   Chronic diastolic CHF (congestive heart failure)   Diabetes mellitus without complication   Depression    Time spent: 15 min    VANN, JESSICA  [facility] Pager [phone]. If 7PM-7AM, please contact night-coverage at [email or web address], password [facility] 04/23/2015, 11:07 AM  LOS: 2 days

## 2015-04-24 ENCOUNTER — Encounter (HOSPITAL_COMMUNITY): Payer: Self-pay | Admitting: Interventional Cardiology

## 2015-04-24 MED FILL — Heparin Sodium (Porcine) 2 Unit/ML in Sodium Chloride 0.9%: INTRAMUSCULAR | Qty: 500 | Status: AC

## 2015-04-24 MED FILL — Lidocaine HCl Local Preservative Free (PF) Inj 1%: INTRAMUSCULAR | Qty: 30 | Status: AC

## 2015-05-05 ENCOUNTER — Encounter: Payer: Self-pay | Admitting: Nurse Practitioner

## 2015-05-05 ENCOUNTER — Ambulatory Visit (INDEPENDENT_AMBULATORY_CARE_PROVIDER_SITE_OTHER): Payer: Medicare Other | Admitting: Nurse Practitioner

## 2015-05-05 VITALS — BP 128/66 | HR 63 | Ht 62.0 in | Wt 196.8 lb

## 2015-05-05 DIAGNOSIS — Z9889 Other specified postprocedural states: Secondary | ICD-10-CM | POA: Diagnosis not present

## 2015-05-05 DIAGNOSIS — I2510 Atherosclerotic heart disease of native coronary artery without angina pectoris: Secondary | ICD-10-CM | POA: Diagnosis not present

## 2015-05-05 LAB — CBC
HCT: 29.4 % — ABNORMAL LOW (ref 36.0–46.0)
Hemoglobin: 9.9 g/dL — ABNORMAL LOW (ref 12.0–15.0)
MCH: 29.7 pg (ref 26.0–34.0)
MCHC: 33.7 g/dL (ref 30.0–36.0)
MCV: 88.3 fL (ref 78.0–100.0)
MPV: 9.1 fL (ref 8.6–12.4)
Platelets: 255 10*3/uL (ref 150–400)
RBC: 3.33 MIL/uL — ABNORMAL LOW (ref 3.87–5.11)
RDW: 14.6 % (ref 11.5–15.5)
WBC: 6.8 10*3/uL (ref 4.0–10.5)

## 2015-05-05 LAB — BASIC METABOLIC PANEL
BUN: 20 mg/dL (ref 7–25)
CO2: 28 mmol/L (ref 20–31)
Calcium: 9.5 mg/dL (ref 8.6–10.4)
Chloride: 101 mmol/L (ref 98–110)
Creat: 1.18 mg/dL — ABNORMAL HIGH (ref 0.60–0.93)
Glucose, Bld: 150 mg/dL — ABNORMAL HIGH (ref 65–99)
Potassium: 4 mmol/L (ref 3.5–5.3)
Sodium: 137 mmol/L (ref 135–146)

## 2015-05-05 NOTE — Patient Instructions (Addendum)
We will be checking the following labs today - BMET & CBC   Medication Instructions:    Continue with your current medicines.   Get some over the counter Prilosec or Nexium and take one a day    Testing/Procedures To Be Arranged:  N/A  Follow-Up:   See Dr. Eldridge Dace in 4 months    Other Special Instructions:   N/A  Call the [facility] office at [phone] if you have any questions, problems or concerns.

## 2015-05-05 NOTE — Progress Notes (Signed)
CARDIOLOGY OFFICE NOTE  Date:  05/05/2015    [name] Date of Birth: 17-Dec-1945 Medical Record #[id number]  PCP:  Pamelia Hoit, MD  Cardiologist:  Former patient of Dr. Henrietta Hoover    Chief Complaint  Patient presents with  . Chest Pain    Post hospital visit - former patient of Dr. Henrietta Hoover.     History of Present Illness: [name] is a 69 y.o. female who presents today for a post cardiac cath visit. Former patient of Dr. Henrietta Hoover. She will be seeing Dr. Eldridge Dace going forward. She has a PMH of CAD, hypertension, hyperlipidemia, diabetes mellitus, GERD, L2, OSA, PAD, mitral valve regurgitation, history of stroke, diastolic congestive heart failure (EF 60-65%), & CKD-III.   She most recently presented with chest pain.  In ED, patient was found to have negative troponin, CBC 6.8, temperature normal, bradycardia, stable renal function, negative chest x-ray. Patient is admitted to inpatient for further evaluation and treatment and underwent cardiac cath following abnormal Myoview. This showed nonobstructive disease and she will continue to be managed medically.  Comes back today. Here alone. She feels better. Little short of breath if she bends over to tie her shoes or if she really exerts but otherwise she is ok. No more chest pain but had some for a day or so right after discharge and since then this went away. Saw Dr. Andrey Campanile last week - no changes made and no labs rechecked. She is not on PPI therapy. She was not aware she is anemic. No recent GI visits reported. Rare belching.   Past Medical History  Diagnosis Date  . Atypical face pain   . Hypertension   . CAD (coronary artery disease)     Catheterization 2006, mild two-vessel nonobstructive disease  . GERD (gastroesophageal reflux disease)   . Headache(784.0)   . Hyperlipidemia   . Arthritis   . Sleep apnea     CPAP compliance ???  . Diabetes mellitus   . Shortness of breath   . PAD (peripheral artery disease)  (HCC)     Dr. Darrick Penna,  Bilateral superficial femoral artery occlusions with one vessel runoff via the peroneal artery  . Ejection fraction     EF 60%, echo, October, 2010,  . Urinary incontinence   . Overweight(278.02)   . Mitral regurgitation     Mild, echo, October, 2010  . Fluid overload   . Foot pain, right     November, 2012  . Stroke (HCC)   . UTI (lower urinary tract infection)   . CKD (chronic kidney disease), stage III     Past Surgical History  Procedure Laterality Date  . Cesarean section    . Abdominal hysterectomy    . Eye surgery  12/11/10  . Vesicovaginal fistula closure w/ tah    . Artery biopsy  12/27/2011    Procedure: BIOPSY TEMPORAL ARTERY;  Surgeon: Sherren Kerns, MD;  Location[facility] OR;  Service: Vascular;  Laterality: Left;  . Cardiac catheterization N/A 04/23/2015    Procedure: Left Heart Cath and Coronary Angiography;  Surgeon: Corky Crafts, MD;  Location: [facility] INVASIVE CV LAB;  Service: Cardiovascular;  Laterality: N/A;     Medications: Current Outpatient Prescriptions  Medication Sig Dispense Refill  . allopurinol (ZYLOPRIM) 300 MG tablet Take 300 mg by mouth daily.     Marland Kitchen aspirin EC 81 MG tablet Take 1 tablet (81 mg total) by mouth daily. 90 tablet 3  . atorvastatin (LIPITOR) 40  MG tablet TAKE 1 TABLET BY MOUTH EVERY DAY 90 tablet 0  . citalopram (CELEXA) 20 MG tablet Take 20 mg by mouth daily.     Marland Kitchen HYDROcodone-acetaminophen (NORCO/VICODIN) 5-325 MG per tablet Take 1-2 tablets by mouth every 4 (four) hours as needed for moderate pain or severe pain.     . metFORMIN (GLUCOPHAGE) 1000 MG tablet Take 1,000 mg by mouth 2 (two) times daily with a meal.     . metoprolol (LOPRESSOR) 100 MG tablet Take 100 mg by mouth 2 (two) times daily.     Marland Kitchen NITROSTAT 0.4 MG SL tablet DISSOLVE ONE TABLET UNDER TONGUE AS NEEDED FOR CHEST PAIN EVERY 5 MINUTES 25 tablet 3  . traZODone (DESYREL) 100 MG tablet Take 100 mg by mouth at bedtime as needed for sleep.      No  current facility-administered medications for this visit.    Allergies: Allergies  Allergen Reactions  . Amoxicillin     Yeast infection  . Latex Itching    Social History: The patient  reports that she quit smoking about 4 years ago. Her smoking use included Cigarettes. She does not have any smokeless tobacco history on file. She reports that she drinks alcohol. She reports that she does not use illicit drugs.   Family History: The patient's family history includes Diabetes in her mother and sister; Heart disease in her father; Hyperlipidemia in her mother and sister; Hypertension in her mother and sister. There is no history of Anesthesia problems.   Review of Systems: Please see the history of present illness.   Otherwise, the review of systems is positive for none.   All other systems are reviewed and negative.   Physical Exam: VS:  BP 128/66 mmHg  Pulse 63  Ht  (1.575 m)  Wt 196 lb 12.8 oz (89.268 kg)  BMI 35.99 kg/m2  SpO2 93% .  BMI Body mass index is 35.99 kg/(m^2).  Wt Readings from Last 3 Encounters:  05/05/15 196 lb 12.8 oz (89.268 kg)  04/21/15 194 lb 3.2 oz (88.089 kg)  12/11/14 197 lb (89.359 kg)    General: Pleasant. Well developed, well nourished and in no acute distress. She is obese.  HEENT: Normal. Neck: Supple, no JVD, carotid bruits, or masses noted.  Cardiac: Regular rate and rhythm. No murmurs, rubs, or gallops. No edema.  Respiratory:  Lungs are clear to auscultation bilaterally with normal work of breathing.  GI: Soft and nontender.  MS: No deformity or atrophy. Gait and ROM intact. Skin: Warm and dry. Color is normal.  Neuro:  Strength and sensation are intact and no gross focal deficits noted.  Psych: Alert, appropriate and with normal affect. Cath site right wrist is ok.    LABORATORY DATA:  EKG:  EKG is not ordered today.  Lab Results  Component Value Date   WBC 5.7 04/23/2015   HGB 9.6* 04/23/2015   HCT 30.5* 04/23/2015   PLT  246 04/23/2015   GLUCOSE 125* 04/23/2015   CHOL 110 04/22/2015   TRIG 143 04/22/2015   HDL 27* 04/22/2015   LDLCALC 54 04/22/2015   ALT 11* 04/22/2015   AST 18 04/22/2015   NA 136 04/23/2015   K 4.3 04/23/2015   CL 103 04/23/2015   CREATININE 1.22* 04/23/2015   BUN 17 04/23/2015   CO2 27 04/23/2015   TSH 1.14 09/23/2014   INR 1.07 04/21/2015   HGBA1C 6.7* 04/22/2015    BNP (last 3 results)  Recent Labs  04/21/15  1842  BNP 12.0    ProBNP (last 3 results) No results for input(s): PROBNP in the last 8760 hours.   Other Studies Reviewed Today:  Myoview 05-15-15 Study Result      There was no ST segment deviation noted during stress.  T wave inversion was noted during stress in the II, III, aVF, V3, V4, V5 and V6 leads, beginning at 0 minutes of stress, ending at 5 minutes of stress, and returning to baseline after less than 1 min of recovery.  Defect 1: There is a small defect of mild severity present in the mid anteroseptal, apical anterior and apical septal location.  Defect 2: There is a small defect of mild severity present in the basal anteroseptal and basal inferoseptal location.  This is an intermediate risk study.  The left ventricular ejection fraction is normal (55-65%).  Nuclear stress EF: 65%.             Coronary Findings from 03/2015   Dominance: Right   Left Anterior Descending  The vessel exhibits minimal luminal irregularities.     Left Circumflex  The vessel exhibits minimal luminal irregularities.     Right Coronary Artery  There is mild the vessel.   . Right Posterior Descending Artery   There is mild in the vessel.      Left Heart    Aortic Valve There is no aortic valve stenosis.    Coronary Diagrams    Diagnostic Diagram           Echo Study Conclusions from 03/2015  - Left ventricle: The cavity size was normal. Systolic function was normal. The estimated ejection fraction was in the range of  60% to 65%. Wall motion was normal; there were no regional wall motion abnormalities. There was an increased relative contribution of atrial contraction to ventricular filling. Doppler parameters are consistent with abnormal left ventricular relaxation (grade 1 diastolic dysfunction). - Aortic valve: Moderate focal calcification. - Tricuspid valve: There was trivial regurgitation. - Pulmonic valve: There was mild regurgitation.   Assessment/Plan: 1. Chest pain with mild irregularities noted on cardiac cath - will manage medically. Add OTC PPI therapy. Recheck her labs - may need to get to GI  2. HTN - BP ok on current regimen.   3. Diastolic HF - explained need for salt restriction, good BP control and weights.   4. Anemia - recheck her labs today.  She would like to establish with Dr. Eldridge Dace going forward.   Current medicines are reviewed with the patient today.  The patient does not have concerns regarding medicines other than what has been noted above.  The following changes have been made:  See above.  Labs/ tests ordered today include:    Orders Placed This Encounter  Procedures  . Basic metabolic panel  . CBC     Disposition:   FU with Dr. Eldridge Dace in months.    Patient is agreeable to this plan and will call if any problems develop in the interim.   Signed: Rosalio Macadamia, RN, ANP-C 05/05/2015 2:03 PM  [facility] [address] [place], [place]  [address] Phone: [phone] Fax: [phone]

## 2015-05-06 ENCOUNTER — Telehealth: Payer: Self-pay

## 2015-05-06 NOTE — Telephone Encounter (Signed)
Called to give pt lab results.[email or web address]  

## 2015-05-06 NOTE — Telephone Encounter (Signed)
-----   Message from Rosalio Macadamia, NP sent at 05/06/2015  7:36 AM EDT ----- Ok to report. Labs are stable. Anemia is stable.  Send labs and my note to her PCP

## 2015-08-08 ENCOUNTER — Ambulatory Visit: Payer: Medicare Other | Admitting: Interventional Cardiology

## 2015-08-13 ENCOUNTER — Encounter: Payer: Self-pay | Admitting: Interventional Cardiology

## 2015-08-26 ENCOUNTER — Other Ambulatory Visit: Payer: Self-pay | Admitting: Orthopedic Surgery

## 2015-08-29 ENCOUNTER — Telehealth: Payer: Self-pay | Admitting: *Deleted

## 2015-08-29 ENCOUNTER — Encounter: Payer: Self-pay | Admitting: Nurse Practitioner

## 2015-08-29 NOTE — Telephone Encounter (Signed)
lvm to talk with pt about surgical clearance

## 2015-08-29 NOTE — Telephone Encounter (Signed)
S/w pt denies chest pain, dizziness, sob, pre-syncope.  Norma Fredrickson, NP stated pt can be cleared from a cardiac standpoint for a shoulder replacement. Will fax over paperwork to [facility] @ [phone] and phone number is [phone].

## 2015-08-29 NOTE — Progress Notes (Signed)
Received paper work for cardiac clearance for right shoulder replacement.  Seen last in October of 2016 following cardiac catheterization - only mild irregularities noted.  No current cardiac symptoms.  She is given the ok to proceed from a cardiac standpoint.   Rosalio Macadamia, RN, ANP-C [facility] [address] [place], [place]  [address] [phone]

## 2015-09-09 ENCOUNTER — Ambulatory Visit (HOSPITAL_COMMUNITY)
Admission: RE | Admit: 2015-09-09 | Discharge: 2015-09-09 | Disposition: A | Discharge status: Home / Self Care | Payer: Worker's Compensation | Source: Ambulatory Visit | Attending: Orthopedic Surgery | Admitting: Orthopedic Surgery

## 2015-09-09 ENCOUNTER — Encounter (HOSPITAL_COMMUNITY)
Admission: RE | Admit: 2015-09-09 | Discharge: 2015-09-09 | Disposition: A | Discharge status: Home / Self Care | Payer: Worker's Compensation | Source: Ambulatory Visit | Attending: Orthopedic Surgery | Admitting: Orthopedic Surgery

## 2015-09-09 ENCOUNTER — Encounter (HOSPITAL_COMMUNITY): Payer: Self-pay | Admitting: *Deleted

## 2015-09-09 ENCOUNTER — Telehealth: Payer: Self-pay | Admitting: Nurse Practitioner

## 2015-09-09 ENCOUNTER — Encounter (HOSPITAL_COMMUNITY): Payer: Self-pay

## 2015-09-09 DIAGNOSIS — M19011 Primary osteoarthritis, right shoulder: Secondary | ICD-10-CM | POA: Insufficient documentation

## 2015-09-09 DIAGNOSIS — E1122 Type 2 diabetes mellitus with diabetic chronic kidney disease: Secondary | ICD-10-CM | POA: Insufficient documentation

## 2015-09-09 DIAGNOSIS — G4733 Obstructive sleep apnea (adult) (pediatric): Secondary | ICD-10-CM | POA: Diagnosis not present

## 2015-09-09 DIAGNOSIS — N183 Chronic kidney disease, stage 3 (moderate): Secondary | ICD-10-CM | POA: Insufficient documentation

## 2015-09-09 DIAGNOSIS — I129 Hypertensive chronic kidney disease with stage 1 through stage 4 chronic kidney disease, or unspecified chronic kidney disease: Secondary | ICD-10-CM | POA: Diagnosis not present

## 2015-09-09 DIAGNOSIS — M75101 Unspecified rotator cuff tear or rupture of right shoulder, not specified as traumatic: Secondary | ICD-10-CM | POA: Diagnosis not present

## 2015-09-09 DIAGNOSIS — Z01812 Encounter for preprocedural laboratory examination: Secondary | ICD-10-CM | POA: Insufficient documentation

## 2015-09-09 DIAGNOSIS — I340 Nonrheumatic mitral (valve) insufficiency: Secondary | ICD-10-CM | POA: Diagnosis not present

## 2015-09-09 DIAGNOSIS — Z87891 Personal history of nicotine dependence: Secondary | ICD-10-CM | POA: Diagnosis not present

## 2015-09-09 DIAGNOSIS — I739 Peripheral vascular disease, unspecified: Secondary | ICD-10-CM | POA: Insufficient documentation

## 2015-09-09 DIAGNOSIS — Z7982 Long term (current) use of aspirin: Secondary | ICD-10-CM | POA: Diagnosis not present

## 2015-09-09 DIAGNOSIS — Z01818 Encounter for other preprocedural examination: Secondary | ICD-10-CM | POA: Diagnosis not present

## 2015-09-09 DIAGNOSIS — Z79899 Other long term (current) drug therapy: Secondary | ICD-10-CM | POA: Insufficient documentation

## 2015-09-09 DIAGNOSIS — E785 Hyperlipidemia, unspecified: Secondary | ICD-10-CM | POA: Diagnosis not present

## 2015-09-09 DIAGNOSIS — Z7984 Long term (current) use of oral hypoglycemic drugs: Secondary | ICD-10-CM | POA: Diagnosis not present

## 2015-09-09 DIAGNOSIS — I2510 Atherosclerotic heart disease of native coronary artery without angina pectoris: Secondary | ICD-10-CM | POA: Insufficient documentation

## 2015-09-09 HISTORY — DX: Other complications of anesthesia, initial encounter: T88.59XA

## 2015-09-09 HISTORY — DX: Adverse effect of unspecified anesthetic, initial encounter: T41.45XA

## 2015-09-09 LAB — COMPREHENSIVE METABOLIC PANEL
ALBUMIN: 3.3 g/dL — AB (ref 3.5–5.0)
ALK PHOS: 111 U/L (ref 38–126)
ALT: 25 U/L (ref 14–54)
AST: 26 U/L (ref 15–41)
Anion gap: 11 (ref 5–15)
BILIRUBIN TOTAL: 0.9 mg/dL (ref 0.3–1.2)
BUN: 23 mg/dL — AB (ref 6–20)
CO2: 27 mmol/L (ref 22–32)
CREATININE: 1.23 mg/dL — AB (ref 0.44–1.00)
Calcium: 9.8 mg/dL (ref 8.9–10.3)
Chloride: 104 mmol/L (ref 101–111)
GFR calc Af Amer: 50 mL/min — ABNORMAL LOW (ref >60)
GFR calc non Af Amer: 43 mL/min — ABNORMAL LOW (ref >60)
GLUCOSE: 116 mg/dL — AB (ref 65–99)
POTASSIUM: 3.9 mmol/L (ref 3.5–5.1)
Sodium: 142 mmol/L (ref 135–145)
Total Protein: 7.8 g/dL (ref 6.5–8.1)

## 2015-09-09 LAB — CBC WITH DIFFERENTIAL/PLATELET
BASOS ABS: 0 10*3/uL (ref 0.0–0.1)
BASOS PCT: 0 %
Eosinophils Absolute: 0.1 10*3/uL (ref 0.0–0.7)
Eosinophils Relative: 2 %
HEMATOCRIT: 33.7 % — AB (ref 36.0–46.0)
HEMOGLOBIN: 10.5 g/dL — AB (ref 12.0–15.0)
Lymphocytes Relative: 18 %
Lymphs Abs: 1.2 10*3/uL (ref 0.7–4.0)
MCH: 28.9 pg (ref 26.0–34.0)
MCHC: 31.2 g/dL (ref 30.0–36.0)
MCV: 92.8 fL (ref 78.0–100.0)
Monocytes Absolute: 0.3 10*3/uL (ref 0.1–1.0)
Monocytes Relative: 5 %
NEUTROS ABS: 4.8 10*3/uL (ref 1.7–7.7)
NEUTROS PCT: 75 %
Platelets: 253 10*3/uL (ref 150–400)
RBC: 3.63 MIL/uL — AB (ref 3.87–5.11)
RDW: 14.3 % (ref 11.5–15.5)
WBC: 6.5 10*3/uL (ref 4.0–10.5)

## 2015-09-09 LAB — PROTIME-INR
INR: 1.02 (ref 0.00–1.49)
Prothrombin Time: 13.7 seconds (ref 11.6–15.2)

## 2015-09-09 LAB — URINALYSIS, ROUTINE W REFLEX MICROSCOPIC
BILIRUBIN URINE: NEGATIVE
Glucose, UA: NEGATIVE mg/dL
Hgb urine dipstick: NEGATIVE
Ketones, ur: NEGATIVE mg/dL
NITRITE: NEGATIVE
PH: 5 (ref 5.0–8.0)
Protein, ur: NEGATIVE mg/dL
SPECIFIC GRAVITY, URINE: 1.018 (ref 1.005–1.030)

## 2015-09-09 LAB — TYPE AND SCREEN
ABO/RH(D): A POS
ANTIBODY SCREEN: NEGATIVE

## 2015-09-09 LAB — URINE MICROSCOPIC-ADD ON

## 2015-09-09 LAB — SURGICAL PCR SCREEN
MRSA, PCR: NEGATIVE
STAPHYLOCOCCUS AUREUS: NEGATIVE

## 2015-09-09 LAB — GLUCOSE, CAPILLARY: Glucose-Capillary: 108 mg/dL — ABNORMAL HIGH (ref 65–99)

## 2015-09-09 LAB — APTT: APTT: 29 s (ref 24–37)

## 2015-09-09 NOTE — Pre-Procedure Instructions (Signed)
    [name]  09/09/2015    Your procedure is scheduled on Thursday, February 23.  Report to [facility] Admitting at 5:30 A.M.                Your surgery or procedure is scheduled for 7:30 AM  Call this number if you have problems the morning of surgery:[phone]                   For any other questions, please call [phone], Monday - Friday 8 AM - 4 PM.   Remember:  Do not eat food or drink liquids after midnight Wednesday, February 22.  Take these medicines the morning of surgery with A SIP OF WATER:allopurinol (ZYLOPRIM),  citalopram (CELEXA), metoprolol (LOPRESSOR).                   Take if needed: NITROSTAT,  traMADol (ULTRAM).                    Do not wear jewelry, make-up or nail polish.   Do not wear lotions, powders, or perfumes.    Do not shave 48 hours prior to surgery.    Do not bring valuables to the hospital.  [facility] is not responsible for any belongings or valuables.  Contacts, dentures or bridgework may not be worn into surgery.  Leave your suitcase in the car.  After surgery it may be brought to your room.  For patients admitted to the hospital, discharge time will be determined by your treatment team.  Special instructions:  Review   - Preparing For Surgery.  Please read over the following fact sheets that you were given. Pain Booklet, Coughing and Deep Breathing, MRSA Information and Surgical Site Infection Prevention and Incentive Spirometry

## 2015-09-09 NOTE — Progress Notes (Signed)
How to Manage Your Diabetes Before Surgery  What do I do about my diabetes medications?   Do not take oral diabetes medicines (pills) the morning of surgery.  Why is it important to control my blood sugar before and after surgery?   Improving blood sugar levels before and after surgery helps healing and can limit problems.  A way of improving blood sugar control is eating a healthy diet by:  - Eating less sugar and carbohydrates  - Increasing activity/exercise  - Talk with your doctor about reaching your blood sugar goals  High blood sugars (greater than 180 mg/dL) can raise your risk of infections and slow down your recovery so you will need to focus on controlling your diabetes during the weeks before surgery.  Make sure that the doctor who takes care of your diabetes knows about your planned surgery including the date and location.  How do I manage my blood sugars before surgery?   Check your blood sugar at least 4 times a day, 2 days before surgery to make sure that they are not too high or low.   Check your blood sugar the morning of your surgery when you wake up and every 2  hours until you get to the Short-Stay unit.  If your blood sugar is less than 70 mg/dL, you will need to treat for low blood sugar by:  Treat a low blood sugar (less than 70 mg/dL) with 1/2 cup of clear juice (cranberry or apple), 4 glucose tablets, OR glucose gel.  Recheck blood sugar in 15 minutes after treatment (to make sure it is greater than 70 mg/dL).  If blood sugar is not greater than 70 mg/dL on re-check, call [phone] for further instructions.   Report your blood sugar to the Short-Stay nurse when you get to Short-Stay.  References:  [facility], 2007 "How to Manage your Diabetes Before and After Surgery".

## 2015-09-09 NOTE — Progress Notes (Signed)
Ms Ruppe denies chest pain or shortness of breath.  "I have not had any chest pain since was in the hospital last year."   Ms Mayorga states she has some bad teeth, I asked her if she had an infection, "I don't know, I have been to a dentist in along time."   Ms Peter states that she has not been instructed about when to stop Aspirin. I called Carla at Dr Veda Canning office and informed her about patients, "bad teeth", and asked if she  Had information from her cardiologist regarding when to stop Aspirin.  Albin Felling stated that she will find out and call patient.

## 2015-09-09 NOTE — Telephone Encounter (Signed)
New Message  RN from [facility] calling about clearance- stated that she does have clearance from [place] concerning pt surgery froma cardiac standpoint but wants to know when pt can stop aspirin. Please call back and discuss.

## 2015-09-10 NOTE — Telephone Encounter (Signed)
lvm to let pt know to stop taking aspirin 5 days prior to surgery.

## 2015-09-10 NOTE — Progress Notes (Signed)
Anesthesia Chart Review:  Pt is a 70 year old female scheduled for R reverse shoulder arthroplasty on 09/18/2015 with Dr. Ave Filter.   Cardiologist is Dr. Eldridge Dace. Pt has cardiac clearance for surgery from Norma Fredrickson, NP (08/29/15)  PMH includes:  CAD, HTN, hyperlipidemia, DM, PAD, mitral regurgitation, CKD (stage III), OSA, stroke (pt denies). Former smoker. BMI 37.   Medications include: ASA, lipitor, hctz, metformin, metoprolol. Pt to stop ASA 5 days before surgery.   Preoperative labs reviewed.    Chest x-ray 09/09/15 reviewed. No active cardiopulmonary disease.   EKG 04/22/15: Sinus bradycardia (57 bpm). Moderate voltage criteria for LVH, may be normal variant  Cardiac cath 04/23/15:   Mild, non-obstructive CAD (luminal irregularities).  Normal LVEDP.  Echo 04/22/15:  - Left ventricle: The cavity size was normal. Systolic function was normal. The estimated ejection fraction was in the range of 60% to 65%. Wall motion was normal; there were no regional wall motion abnormalities. There was an increased relative contribution of atrial contraction to ventricular filling.Doppler parameters are consistent with abnormal left ventricularrelaxation (grade 1 diastolic dysfunction). - Aortic valve: Moderate focal calcification. - Tricuspid valve: There was trivial regurgitation. - Pulmonic valve: There was mild regurgitation.  If no changes, I anticipate pt can proceed with surgery as scheduled.   Rica Mast, FNP-BC [facility]esthesiology Phone: [phone] 09/10/2015 11:16 AM

## 2015-09-10 NOTE — Telephone Encounter (Signed)
Stop aspirin 5 days prior to surgery.

## 2015-09-17 MED ORDER — POVIDONE-IODINE 7.5 % EX SOLN
Freq: Once | CUTANEOUS | Status: DC
  Filled 2015-09-17: qty 118

## 2015-09-17 MED ORDER — CLINDAMYCIN PHOSPHATE 900 MG/50ML IV SOLN
900.0000 mg | INTRAVENOUS | Status: AC
  Administered 2015-09-18: 900 mg via INTRAVENOUS
  Filled 2015-09-17: qty 50

## 2015-09-18 ENCOUNTER — Inpatient Hospital Stay (HOSPITAL_COMMUNITY)
Admission: RE | Admit: 2015-09-18 | Discharge: 2015-09-19 | DRG: 483 | Disposition: A | Discharge status: Home / Self Care | Payer: Worker's Compensation | Source: Ambulatory Visit | Attending: Orthopedic Surgery | Admitting: Orthopedic Surgery

## 2015-09-18 ENCOUNTER — Encounter (HOSPITAL_COMMUNITY)
Admission: RE | Disposition: A | Discharge status: Home / Self Care | Payer: Self-pay | Source: Ambulatory Visit | Attending: Orthopedic Surgery

## 2015-09-18 ENCOUNTER — Inpatient Hospital Stay (HOSPITAL_COMMUNITY): Payer: Worker's Compensation | Admitting: Emergency Medicine

## 2015-09-18 ENCOUNTER — Encounter (HOSPITAL_COMMUNITY): Payer: Self-pay | Admitting: *Deleted

## 2015-09-18 ENCOUNTER — Inpatient Hospital Stay (HOSPITAL_COMMUNITY): Payer: Worker's Compensation | Admitting: Anesthesiology

## 2015-09-18 ENCOUNTER — Inpatient Hospital Stay (HOSPITAL_COMMUNITY): Payer: Worker's Compensation

## 2015-09-18 DIAGNOSIS — E785 Hyperlipidemia, unspecified: Secondary | ICD-10-CM | POA: Diagnosis present

## 2015-09-18 DIAGNOSIS — Z7984 Long term (current) use of oral hypoglycemic drugs: Secondary | ICD-10-CM | POA: Diagnosis not present

## 2015-09-18 DIAGNOSIS — M19011 Primary osteoarthritis, right shoulder: Secondary | ICD-10-CM | POA: Diagnosis not present

## 2015-09-18 DIAGNOSIS — I129 Hypertensive chronic kidney disease with stage 1 through stage 4 chronic kidney disease, or unspecified chronic kidney disease: Secondary | ICD-10-CM | POA: Diagnosis present

## 2015-09-18 DIAGNOSIS — Z9842 Cataract extraction status, left eye: Secondary | ICD-10-CM | POA: Diagnosis not present

## 2015-09-18 DIAGNOSIS — Z79899 Other long term (current) drug therapy: Secondary | ICD-10-CM

## 2015-09-18 DIAGNOSIS — M25511 Pain in right shoulder: Secondary | ICD-10-CM | POA: Diagnosis present

## 2015-09-18 DIAGNOSIS — K219 Gastro-esophageal reflux disease without esophagitis: Secondary | ICD-10-CM | POA: Diagnosis present

## 2015-09-18 DIAGNOSIS — Z9841 Cataract extraction status, right eye: Secondary | ICD-10-CM | POA: Diagnosis not present

## 2015-09-18 DIAGNOSIS — I2510 Atherosclerotic heart disease of native coronary artery without angina pectoris: Secondary | ICD-10-CM | POA: Diagnosis present

## 2015-09-18 DIAGNOSIS — E1122 Type 2 diabetes mellitus with diabetic chronic kidney disease: Secondary | ICD-10-CM | POA: Diagnosis present

## 2015-09-18 DIAGNOSIS — Z7982 Long term (current) use of aspirin: Secondary | ICD-10-CM

## 2015-09-18 DIAGNOSIS — Z87891 Personal history of nicotine dependence: Secondary | ICD-10-CM

## 2015-09-18 DIAGNOSIS — Z96619 Presence of unspecified artificial shoulder joint: Secondary | ICD-10-CM

## 2015-09-18 DIAGNOSIS — I739 Peripheral vascular disease, unspecified: Secondary | ICD-10-CM | POA: Diagnosis present

## 2015-09-18 DIAGNOSIS — N183 Chronic kidney disease, stage 3 (moderate): Secondary | ICD-10-CM | POA: Diagnosis present

## 2015-09-18 DIAGNOSIS — G4730 Sleep apnea, unspecified: Secondary | ICD-10-CM | POA: Diagnosis present

## 2015-09-18 DIAGNOSIS — Z96611 Presence of right artificial shoulder joint: Secondary | ICD-10-CM

## 2015-09-18 HISTORY — PX: REVERSE SHOULDER ARTHROPLASTY: SHX5054

## 2015-09-18 HISTORY — PX: REVERSE TOTAL SHOULDER ARTHROPLASTY: SHX2344

## 2015-09-18 LAB — GLUCOSE, CAPILLARY
GLUCOSE-CAPILLARY: 127 mg/dL — AB (ref 65–99)
GLUCOSE-CAPILLARY: 159 mg/dL — AB (ref 65–99)
GLUCOSE-CAPILLARY: 105 mg/dL — AB (ref 65–99)
Glucose-Capillary: 109 mg/dL — ABNORMAL HIGH (ref 65–99)

## 2015-09-18 SURGERY — ARTHROPLASTY, SHOULDER, TOTAL, REVERSE
Anesthesia: Regional | Site: Shoulder | Laterality: Right

## 2015-09-18 MED ORDER — HYDROCHLOROTHIAZIDE 25 MG PO TABS
25.0000 mg | ORAL_TABLET | Freq: Every day | ORAL | Status: DC
  Administered 2015-09-18 – 2015-09-19 (×2): 25 mg via ORAL
  Filled 2015-09-18 (×2): qty 1

## 2015-09-18 MED ORDER — SODIUM CHLORIDE 0.9 % IJ SOLN
INTRAMUSCULAR | Status: AC
  Filled 2015-09-18: qty 10

## 2015-09-18 MED ORDER — GLYCOPYRROLATE 0.2 MG/ML IJ SOLN
INTRAMUSCULAR | Status: DC | PRN
  Administered 2015-09-18: 0.2 mg via INTRAVENOUS

## 2015-09-18 MED ORDER — INSULIN ASPART 100 UNIT/ML ~~LOC~~ SOLN
0.0000 [IU] | Freq: Three times a day (TID) | SUBCUTANEOUS | Status: DC
  Administered 2015-09-18: 3 [IU] via SUBCUTANEOUS
  Filled 2015-09-18 (×26): qty 0.15

## 2015-09-18 MED ORDER — PROPOFOL 10 MG/ML IV BOLUS
INTRAVENOUS | Status: DC | PRN
Start: 2015-09-18 — End: 2015-09-18
  Administered 2015-09-18: 140 mg via INTRAVENOUS

## 2015-09-18 MED ORDER — ARTIFICIAL TEARS OP OINT
TOPICAL_OINTMENT | OPHTHALMIC | Status: AC
  Filled 2015-09-18: qty 3.5

## 2015-09-18 MED ORDER — FENTANYL CITRATE (PF) 250 MCG/5ML IJ SOLN
INTRAMUSCULAR | Status: AC
Start: 2015-09-18 — End: 2015-09-18
  Filled 2015-09-18: qty 5

## 2015-09-18 MED ORDER — SODIUM CHLORIDE 0.9 % IJ SOLN: INTRAMUSCULAR | Status: DC | PRN

## 2015-09-18 MED ORDER — ONDANSETRON HCL 4 MG/2ML IJ SOLN: 4.0000 mg | Freq: Four times a day (QID) | INTRAMUSCULAR | Status: DC | PRN

## 2015-09-18 MED ORDER — PROPOFOL 10 MG/ML IV BOLUS
INTRAVENOUS | Status: AC
  Filled 2015-09-18: qty 40

## 2015-09-18 MED ORDER — PROPOFOL 10 MG/ML IV BOLUS: INTRAVENOUS | Status: DC | PRN

## 2015-09-18 MED ORDER — EPHEDRINE SULFATE 50 MG/ML IJ SOLN
INTRAMUSCULAR | Status: DC | PRN
  Administered 2015-09-18: 10 mg via INTRAVENOUS

## 2015-09-18 MED ORDER — MENTHOL 3 MG MT LOZG: 1.0000 | LOZENGE | OROMUCOSAL | Status: DC | PRN

## 2015-09-18 MED ORDER — POLYETHYLENE GLYCOL 3350 17 G PO PACK: 17.0000 g | PACK | Freq: Every day | ORAL | Status: DC | PRN

## 2015-09-18 MED ORDER — ALBUMIN HUMAN 5 % IV SOLN
INTRAVENOUS | Status: DC | PRN
  Administered 2015-09-18: 09:00:00 via INTRAVENOUS

## 2015-09-18 MED ORDER — BUPIVACAINE-EPINEPHRINE 0.25% -1:200000 IJ SOLN: INTRAMUSCULAR | Status: DC | PRN

## 2015-09-18 MED ORDER — MIDAZOLAM HCL 2 MG/2ML IJ SOLN
INTRAMUSCULAR | Status: AC
  Filled 2015-09-18: qty 2

## 2015-09-18 MED ORDER — ONDANSETRON HCL 4 MG/2ML IJ SOLN
INTRAMUSCULAR | Status: AC
  Filled 2015-09-18: qty 2

## 2015-09-18 MED ORDER — DOCUSATE SODIUM 100 MG PO CAPS
100.0000 mg | ORAL_CAPSULE | Freq: Two times a day (BID) | ORAL | Status: DC
  Administered 2015-09-18 – 2015-09-19 (×2): 100 mg via ORAL
  Filled 2015-09-18 (×3): qty 1

## 2015-09-18 MED ORDER — MIDAZOLAM HCL 5 MG/5ML IJ SOLN
INTRAMUSCULAR | Status: DC | PRN
  Administered 2015-09-18: 2 mg via INTRAVENOUS

## 2015-09-18 MED ORDER — SODIUM CHLORIDE 0.9 % IV SOLN
10.0000 mg | INTRAVENOUS | Status: DC | PRN
  Administered 2015-09-18: 10 ug/min via INTRAVENOUS

## 2015-09-18 MED ORDER — SUGAMMADEX SODIUM 200 MG/2ML IV SOLN
INTRAVENOUS | Status: AC
  Filled 2015-09-18: qty 2

## 2015-09-18 MED ORDER — LIDOCAINE HCL (CARDIAC) 20 MG/ML IV SOLN
INTRAVENOUS | Status: AC
  Filled 2015-09-18: qty 5

## 2015-09-18 MED ORDER — TRANEXAMIC ACID 1000 MG/10ML IV SOLN
1000.0000 mg | INTRAVENOUS | Status: AC
  Administered 2015-09-18: 1000 mg via INTRAVENOUS
  Filled 2015-09-18: qty 10

## 2015-09-18 MED ORDER — TRAZODONE HCL 100 MG PO TABS
100.0000 mg | ORAL_TABLET | Freq: Every evening | ORAL | Status: DC | PRN
  Administered 2015-09-18: 100 mg via ORAL
  Filled 2015-09-18: qty 1

## 2015-09-18 MED ORDER — ACETAMINOPHEN 325 MG PO TABS: 650.0000 mg | ORAL_TABLET | Freq: Four times a day (QID) | ORAL | Status: DC | PRN

## 2015-09-18 MED ORDER — DOCUSATE SODIUM 100 MG PO CAPS: 100.0000 mg | ORAL_CAPSULE | Freq: Three times a day (TID) | ORAL | Status: DC | PRN

## 2015-09-18 MED ORDER — FLEET ENEMA 7-19 GM/118ML RE ENEM: 1.0000 | ENEMA | Freq: Once | RECTAL | Status: DC | PRN

## 2015-09-18 MED ORDER — ONDANSETRON HCL 4 MG PO TABS: 4.0000 mg | ORAL_TABLET | Freq: Four times a day (QID) | ORAL | Status: DC | PRN

## 2015-09-18 MED ORDER — POTASSIUM CHLORIDE IN NACL 20-0.45 MEQ/L-% IV SOLN
INTRAVENOUS | Status: DC
  Administered 2015-09-18: 125 mL/h via INTRAVENOUS
  Administered 2015-09-18: 15:00:00 via INTRAVENOUS
  Administered 2015-09-19: 125 mL/h via INTRAVENOUS
  Filled 2015-09-18 (×5): qty 1000

## 2015-09-18 MED ORDER — SODIUM CHLORIDE 0.9 % IR SOLN
Status: DC | PRN
  Administered 2015-09-18: 3000 mL

## 2015-09-18 MED ORDER — OXYCODONE HCL 5 MG PO TABS
5.0000 mg | ORAL_TABLET | ORAL | Status: DC | PRN
  Administered 2015-09-18 – 2015-09-19 (×4): 10 mg via ORAL
  Filled 2015-09-18 (×4): qty 2

## 2015-09-18 MED ORDER — PHENOL 1.4 % MT LIQD
1.0000 | OROMUCOSAL | Status: DC | PRN
Start: 2015-09-18 — End: 2015-09-19

## 2015-09-18 MED ORDER — BUPIVACAINE-EPINEPHRINE (PF) 0.25% -1:200000 IJ SOLN
INTRAMUSCULAR | Status: AC
  Filled 2015-09-18: qty 30

## 2015-09-18 MED ORDER — SUGAMMADEX SODIUM 200 MG/2ML IV SOLN
INTRAVENOUS | Status: DC | PRN
  Administered 2015-09-18: 200 mg via INTRAVENOUS

## 2015-09-18 MED ORDER — ROCURONIUM BROMIDE 100 MG/10ML IV SOLN
INTRAVENOUS | Status: DC | PRN
  Administered 2015-09-18: 50 mg via INTRAVENOUS

## 2015-09-18 MED ORDER — CEFAZOLIN SODIUM-DEXTROSE 2-3 GM-% IV SOLR
2.0000 g | Freq: Four times a day (QID) | INTRAVENOUS | Status: AC
  Administered 2015-09-18 – 2015-09-19 (×3): 2 g via INTRAVENOUS
  Filled 2015-09-18 (×3): qty 50

## 2015-09-18 MED ORDER — MORPHINE SULFATE (PF) 2 MG/ML IV SOLN
2.0000 mg | INTRAVENOUS | Status: DC | PRN
  Administered 2015-09-18 – 2015-09-19 (×3): 2 mg via INTRAVENOUS
  Filled 2015-09-18 (×3): qty 1

## 2015-09-18 MED ORDER — ACETAMINOPHEN 650 MG RE SUPP: 650.0000 mg | Freq: Four times a day (QID) | RECTAL | Status: DC | PRN

## 2015-09-18 MED ORDER — FENTANYL CITRATE (PF) 100 MCG/2ML IJ SOLN
INTRAMUSCULAR | Status: DC | PRN
  Administered 2015-09-18 (×5): 50 ug via INTRAVENOUS

## 2015-09-18 MED ORDER — PHENYLEPHRINE HCL 10 MG/ML IJ SOLN
INTRAMUSCULAR | Status: DC | PRN
  Administered 2015-09-18: 80 ug via INTRAVENOUS
  Administered 2015-09-18: 40 ug via INTRAVENOUS

## 2015-09-18 MED ORDER — EPHEDRINE SULFATE 50 MG/ML IJ SOLN
INTRAMUSCULAR | Status: AC
  Filled 2015-09-18: qty 1

## 2015-09-18 MED ORDER — HYDROMORPHONE HCL 1 MG/ML IJ SOLN
INTRAMUSCULAR | Status: AC
  Filled 2015-09-18: qty 1

## 2015-09-18 MED ORDER — FENTANYL CITRATE (PF) 250 MCG/5ML IJ SOLN
INTRAMUSCULAR | Status: AC
  Filled 2015-09-18: qty 5

## 2015-09-18 MED ORDER — OXYCODONE-ACETAMINOPHEN 5-325 MG PO TABS: 1.0000 | ORAL_TABLET | ORAL | Status: DC | PRN

## 2015-09-18 MED ORDER — LIDOCAINE HCL (CARDIAC) 20 MG/ML IV SOLN
INTRAVENOUS | Status: DC | PRN
  Administered 2015-09-18: 60 mg via INTRAVENOUS

## 2015-09-18 MED ORDER — METOCLOPRAMIDE HCL 5 MG PO TABS: 5.0000 mg | ORAL_TABLET | Freq: Three times a day (TID) | ORAL | Status: DC | PRN

## 2015-09-18 MED ORDER — BISACODYL 5 MG PO TBEC: 5.0000 mg | DELAYED_RELEASE_TABLET | Freq: Every day | ORAL | Status: DC | PRN

## 2015-09-18 MED ORDER — ONDANSETRON HCL 4 MG/2ML IJ SOLN
INTRAMUSCULAR | Status: DC | PRN
  Administered 2015-09-18: 4 mg via INTRAVENOUS

## 2015-09-18 MED ORDER — ALUM & MAG HYDROXIDE-SIMETH 200-200-20 MG/5ML PO SUSP: 30.0000 mL | ORAL | Status: DC | PRN

## 2015-09-18 MED ORDER — METFORMIN HCL 500 MG PO TABS
1000.0000 mg | ORAL_TABLET | Freq: Two times a day (BID) | ORAL | Status: DC
  Administered 2015-09-18 – 2015-09-19 (×2): 1000 mg via ORAL
  Filled 2015-09-18 (×2): qty 2

## 2015-09-18 MED ORDER — ACETAMINOPHEN 500 MG PO TABS
1000.0000 mg | ORAL_TABLET | Freq: Four times a day (QID) | ORAL | Status: AC
  Administered 2015-09-18 – 2015-09-19 (×4): 1000 mg via ORAL
  Filled 2015-09-18 (×4): qty 2

## 2015-09-18 MED ORDER — LACTATED RINGERS IV SOLN
INTRAVENOUS | Status: DC | PRN
  Administered 2015-09-18 (×2): via INTRAVENOUS

## 2015-09-18 MED ORDER — 0.9 % SODIUM CHLORIDE (POUR BTL) OPTIME
TOPICAL | Status: DC | PRN
  Administered 2015-09-18: 1000 mL

## 2015-09-18 MED ORDER — ROCURONIUM BROMIDE 50 MG/5ML IV SOLN
INTRAVENOUS | Status: AC
  Filled 2015-09-18: qty 1

## 2015-09-18 MED ORDER — ASPIRIN EC 325 MG PO TBEC
325.0000 mg | DELAYED_RELEASE_TABLET | Freq: Two times a day (BID) | ORAL | Status: DC
  Administered 2015-09-18 – 2015-09-19 (×2): 325 mg via ORAL
  Filled 2015-09-18 (×3): qty 1

## 2015-09-18 MED ORDER — PHENYLEPHRINE 40 MCG/ML (10ML) SYRINGE FOR IV PUSH (FOR BLOOD PRESSURE SUPPORT)
PREFILLED_SYRINGE | INTRAVENOUS | Status: AC
  Filled 2015-09-18: qty 10

## 2015-09-18 MED ORDER — GLYCOPYRROLATE 0.2 MG/ML IJ SOLN
INTRAMUSCULAR | Status: AC
  Filled 2015-09-18: qty 1

## 2015-09-18 MED ORDER — SUCCINYLCHOLINE CHLORIDE 20 MG/ML IJ SOLN
INTRAMUSCULAR | Status: AC
  Filled 2015-09-18: qty 1

## 2015-09-18 MED ORDER — CITALOPRAM HYDROBROMIDE 20 MG PO TABS
20.0000 mg | ORAL_TABLET | Freq: Every day | ORAL | Status: DC
  Administered 2015-09-18 – 2015-09-19 (×2): 20 mg via ORAL
  Filled 2015-09-18 (×2): qty 1

## 2015-09-18 MED ORDER — PHENYLEPHRINE HCL 10 MG/ML IJ SOLN
INTRAMUSCULAR | Status: AC
  Filled 2015-09-18: qty 1

## 2015-09-18 MED ORDER — PROMETHAZINE HCL 25 MG/ML IJ SOLN: 6.2500 mg | INTRAMUSCULAR | Status: DC | PRN

## 2015-09-18 MED ORDER — HYDROMORPHONE HCL 1 MG/ML IJ SOLN
0.2500 mg | INTRAMUSCULAR | Status: DC | PRN
  Administered 2015-09-18 (×2): 0.5 mg via INTRAVENOUS

## 2015-09-18 MED ORDER — METOCLOPRAMIDE HCL 5 MG/ML IJ SOLN: 5.0000 mg | Freq: Three times a day (TID) | INTRAMUSCULAR | Status: DC | PRN

## 2015-09-18 MED ORDER — DIPHENHYDRAMINE HCL 12.5 MG/5ML PO ELIX: 12.5000 mg | ORAL_SOLUTION | ORAL | Status: DC | PRN

## 2015-09-18 MED ORDER — ALLOPURINOL 300 MG PO TABS
300.0000 mg | ORAL_TABLET | Freq: Every day | ORAL | Status: DC
  Administered 2015-09-18 – 2015-09-19 (×2): 300 mg via ORAL
  Filled 2015-09-18 (×2): qty 1

## 2015-09-18 MED ORDER — ATORVASTATIN CALCIUM 40 MG PO TABS
40.0000 mg | ORAL_TABLET | Freq: Every day | ORAL | Status: DC
  Administered 2015-09-18 – 2015-09-19 (×2): 40 mg via ORAL
  Filled 2015-09-18 (×2): qty 1

## 2015-09-18 MED ORDER — METOPROLOL TARTRATE 100 MG PO TABS
100.0000 mg | ORAL_TABLET | Freq: Two times a day (BID) | ORAL | Status: DC
  Administered 2015-09-18 – 2015-09-19 (×2): 100 mg via ORAL
  Filled 2015-09-18 (×3): qty 1

## 2015-09-18 MED ORDER — BUPIVACAINE-EPINEPHRINE (PF) 0.5% -1:200000 IJ SOLN
INTRAMUSCULAR | Status: AC
  Filled 2015-09-18: qty 30

## 2015-09-18 SURGICAL SUPPLY — 70 items
0.25% BUPIV WITH EPI. 30 ML IMPLANT
BIT DRILL 5/64X5 DISP (BIT) ×3 IMPLANT
BLADE SAW SAG 73X25 THK (BLADE) ×2
BLADE SAW SGTL 73X25 THK (BLADE) ×1 IMPLANT
BLADE SURG 15 STRL LF DISP TIS (BLADE) ×1 IMPLANT
BLADE SURG 15 STRL SS (BLADE) ×2
BOWL SMART MIX CTS (DISPOSABLE) IMPLANT
CAPT SHLDR REVTOTAL 2 ALTIVATE ×3 IMPLANT
CHLORAPREP W/TINT 26ML (MISCELLANEOUS) ×3 IMPLANT
CLOSURE STERI-STRIP 1/2X4 (GAUZE/BANDAGES/DRESSINGS) ×1
CLOSURE WOUND 1/2 X4 (GAUZE/BANDAGES/DRESSINGS) ×1
CLSR STERI-STRIP ANTIMIC 1/2X4 (GAUZE/BANDAGES/DRESSINGS) ×2 IMPLANT
COVER MAYO STAND STRL (DRAPES) IMPLANT
COVER SURGICAL LIGHT HANDLE (MISCELLANEOUS) ×3 IMPLANT
DRAPE INCISE IOBAN 66X45 STRL (DRAPES) ×3 IMPLANT
DRAPE ORTHO SPLIT 77X108 STRL (DRAPES) ×4
DRAPE SURG ORHT 6 SPLT 77X108 (DRAPES) ×2 IMPLANT
DRSG AQUACEL AG ADV 3.5X10 (GAUZE/BANDAGES/DRESSINGS) ×3 IMPLANT
ELECT BLADE 4.0 EZ CLEAN MEGAD (MISCELLANEOUS) ×3
ELECT REM PT RETURN 9FT ADLT (ELECTROSURGICAL) ×3
ELECTRODE BLDE 4.0 EZ CLN MEGD (MISCELLANEOUS) ×1 IMPLANT
ELECTRODE REM PT RTRN 9FT ADLT (ELECTROSURGICAL) ×1 IMPLANT
EVACUATOR 1/8 PVC DRAIN (DRAIN) IMPLANT
GLOVE BIO SURGEON STRL SZ7 (GLOVE) ×3 IMPLANT
GLOVE BIO SURGEON STRL SZ7.5 (GLOVE) ×3 IMPLANT
GLOVE BIOGEL PI IND STRL 7.0 (GLOVE) ×1 IMPLANT
GLOVE BIOGEL PI IND STRL 8 (GLOVE) ×1 IMPLANT
GLOVE BIOGEL PI INDICATOR 7.0 (GLOVE) ×2
GLOVE BIOGEL PI INDICATOR 8 (GLOVE) ×2
GOWN STRL REUS W/ TWL LRG LVL3 (GOWN DISPOSABLE) ×3 IMPLANT
GOWN STRL REUS W/ TWL XL LVL3 (GOWN DISPOSABLE) ×1 IMPLANT
GOWN STRL REUS W/TWL LRG LVL3 (GOWN DISPOSABLE) ×6
GOWN STRL REUS W/TWL XL LVL3 (GOWN DISPOSABLE) ×2
HANDPIECE INTERPULSE COAX TIP (DISPOSABLE) ×2
HOOD PEEL AWAY FLYTE STAYCOOL (MISCELLANEOUS) ×6 IMPLANT
KIT BASIN OR (CUSTOM PROCEDURE TRAY) ×3 IMPLANT
KIT ROOM TURNOVER OR (KITS) ×3 IMPLANT
MANIFOLD NEPTUNE II (INSTRUMENTS) ×3 IMPLANT
NEEDLE HYPO 25GX1X1/2 BEV (NEEDLE) IMPLANT
NEEDLE MAYO TROCAR (NEEDLE) IMPLANT
NOZZLE PRISM 8.5MM (MISCELLANEOUS) IMPLANT
NS IRRIG 1000ML POUR BTL (IV SOLUTION) ×3 IMPLANT
PACK SHOULDER (CUSTOM PROCEDURE TRAY) ×3 IMPLANT
PAD ARMBOARD 7.5X6 YLW CONV (MISCELLANEOUS) ×6 IMPLANT
RESTRAINT HEAD UNIVERSAL NS (MISCELLANEOUS) ×3 IMPLANT
RETRIEVER SUT HEWSON (MISCELLANEOUS) ×3 IMPLANT
SET HNDPC FAN SPRY TIP SCT (DISPOSABLE) ×1 IMPLANT
SLING ARM IMMOBILIZER LRG (SOFTGOODS) ×3 IMPLANT
SLING ARM IMMOBILIZER MED (SOFTGOODS) IMPLANT
SPONGE LAP 18X18 X RAY DECT (DISPOSABLE) ×3 IMPLANT
SPONGE LAP 4X18 X RAY DECT (DISPOSABLE) IMPLANT
STRIP CLOSURE SKIN 1/2X4 (GAUZE/BANDAGES/DRESSINGS) ×2 IMPLANT
SUCTION FRAZIER HANDLE 10FR (MISCELLANEOUS) ×2
SUCTION TUBE FRAZIER 10FR DISP (MISCELLANEOUS) ×1 IMPLANT
SUPPORT WRAP ARM LG (MISCELLANEOUS) ×3 IMPLANT
SUT ETHIBOND NAB CT1 #1 30IN (SUTURE) ×6 IMPLANT
SUT MNCRL AB 4-0 PS2 18 (SUTURE) ×3 IMPLANT
SUT SILK 2 0 TIES 17X18 (SUTURE)
SUT SILK 2-0 18XBRD TIE BLK (SUTURE) IMPLANT
SUT VIC AB 0 CTB1 27 (SUTURE) ×3 IMPLANT
SUT VIC AB 2-0 CT1 27 (SUTURE) ×2
SUT VIC AB 2-0 CT1 TAPERPNT 27 (SUTURE) ×1 IMPLANT
SYR CONTROL 10ML LL (SYRINGE) IMPLANT
SYRINGE TOOMEY DISP (SYRINGE) IMPLANT
SYS SHLDR CAPITATED REV 2 ×1 IMPLANT
TAPE FIBER 2MM 7IN #2 BLUE (SUTURE) IMPLANT
TOWEL OR 17X24 6PK STRL BLUE (TOWEL DISPOSABLE) ×3 IMPLANT
TOWEL OR 17X26 10 PK STRL BLUE (TOWEL DISPOSABLE) ×3 IMPLANT
WATER STERILE IRR 1000ML POUR (IV SOLUTION) ×3 IMPLANT
YANKAUER SUCT BULB TIP NO VENT (SUCTIONS) IMPLANT

## 2015-09-18 NOTE — Discharge Instructions (Signed)

## 2015-09-18 NOTE — Anesthesia Preprocedure Evaluation (Signed)
Anesthesia Evaluation  Patient identified by MRN, date of birth, ID band Patient awake    Airway Mallampati: II  TM Distance: >3 FB Neck ROM: Full    Dental   Pulmonary shortness of breath, sleep apnea , former smoker,    breath sounds clear to auscultation       Cardiovascular hypertension, + CAD, + Peripheral Vascular Disease and +CHF   Rhythm:Regular Rate:Normal     Neuro/Psych    GI/Hepatic GERD  ,  Endo/Other  diabetes  Renal/GU      Musculoskeletal   Abdominal   Peds  Hematology   Anesthesia Other Findings   Reproductive/Obstetrics                             Anesthesia Physical Anesthesia Plan  ASA: III  Anesthesia Plan:    Post-op Pain Management:    Induction: Intravenous  Airway Management Planned:   Additional Equipment:   Intra-op Plan:   Post-operative Plan: Extubation in OR  Informed Consent: I have reviewed the patients History and Physical, chart, labs and discussed the procedure including the risks, benefits and alternatives for the proposed anesthesia with the patient or authorized representative who has indicated his/her understanding and acceptance.   Dental advisory given  Plan Discussed with: Anesthesiologist and CRNA  Anesthesia Plan Comments:         Anesthesia Quick Evaluation

## 2015-09-18 NOTE — Anesthesia Procedure Notes (Addendum)
Procedure Name: Intubation Date/Time: 09/18/2015 7:35 AM Performed by: Fransisca Kaufmann Pre-anesthesia Checklist: Patient identified, Emergency Drugs available, Suction available, Timeout performed and Patient being monitored Patient Re-evaluated:Patient Re-evaluated prior to inductionOxygen Delivery Method: Circle system utilized Preoxygenation: Pre-oxygenation with 100% oxygen Intubation Type: IV induction Ventilation: Mask ventilation without difficulty Laryngoscope Size: Miller and 2 Grade View: Grade I Tube type: Oral Tube size: 7.5 mm Number of attempts: 1 Airway Equipment and Method: Stylet Placement Confirmation: ETT inserted through vocal cords under direct vision,  positive ETCO2 and breath sounds checked- equal and bilateral Secured at: 22 cm Tube secured with: Tape Dental Injury: Teeth and Oropharynx as per pre-operative assessment  Comments: Teeth in poor repair/loose upper front/intact after intubation   Anesthesia Regional Block:  Interscalene brachial plexus block  Pre-Anesthetic Checklist: ,, timeout performed, Correct Patient, Correct Site, Correct Laterality, Correct Procedure, Correct Position, site marked, Risks and benefits discussed,  Surgical consent,  Pre-op evaluation,  At surgeon's request and post-op pain management  Laterality: Right  Prep: chloraprep       Needles:   Needle Type: Stimulator Needle - 40          Additional Needles:  Procedures: Doppler guided, ultrasound guided (picture in chart) and nerve stimulator Interscalene brachial plexus block  Nerve Stimulator or Paresthesia:  Response: 0.5 mA,   Additional Responses:   Narrative:  Start time: 09/18/2015 7:05 AM End time: 09/18/2015 7:20 AM Injection made incrementally with aspirations every 5 mL.  Performed by: Personally  Anesthesiologist: Judie Petit

## 2015-09-18 NOTE — H&P (Signed)
[name] is an 70 y.o. female.   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  Past Medical History  Diagnosis Date  . Atypical face pain   . Hypertension   . CAD (coronary artery disease)     Catheterization 2006, mild two-vessel nonobstructive disease  . GERD (gastroesophageal reflux disease)   . Headache(784.0)   . Hyperlipidemia   . Arthritis   . Sleep apnea     CPAP compliance ???  . Diabetes mellitus   . Shortness of breath   . PAD (peripheral artery disease) (HCC)     Dr. Darrick Penna,  Bilateral superficial femoral artery occlusions with one vessel runoff via the peroneal artery  . Ejection fraction     EF 60%, echo, October, 2010,  . Urinary incontinence   . Overweight(278.02)   . Mitral regurgitation     Mild, echo, October, 2010  . Fluid overload   . Foot pain, right     November, 2012  . UTI (lower urinary tract infection)   . CKD (chronic kidney disease), stage III   . Complication of anesthesia     "sometimes takes more medication to get me to sleep"  . Stroke [facility])     denies    Past Surgical History  Procedure Laterality Date  . Abdominal hysterectomy    . Eye surgery Bilateral 12/11/10    Cataract  . Vesicovaginal fistula closure w/ tah    . Artery biopsy  12/27/2011    Procedure: BIOPSY TEMPORAL ARTERY;  Surgeon: Sherren Kerns, MD;  Location: [facility] OR;  Service: Vascular;  Laterality: Left;  . Cardiac catheterization N/A 04/23/2015    Procedure: Left Heart Cath and Coronary Angiography;  Surgeon: Corky Crafts, MD;  Location: [facility] INVASIVE CV LAB;  Service: Cardiovascular;  Laterality: N/A;  . Shoulder arthroscopy Right 02/2015    Family History  Problem Relation Age of Onset  . Diabetes Mother   . Hyperlipidemia Mother   . Hypertension Mother   . Heart disease Father   . Diabetes Sister   . Hypertension Sister   .  Hyperlipidemia Sister   . Anesthesia problems Neg Hx    Social History:  reports that she quit smoking about 12 years ago. Her smoking use included Cigarettes. She quit after 45 years of use. She does not have any smokeless tobacco history on file. She reports that she drinks alcohol. She reports that she does not use illicit drugs.  Allergies:  Allergies  Allergen Reactions  . Latex Itching  . Amoxicillin Other (See Comments)    Yeast infection    Medications Prior to Admission  Medication Sig Dispense Refill  . allopurinol (ZYLOPRIM) 300 MG tablet Take 300 mg by mouth daily.     Marland Kitchen aspirin EC 81 MG tablet Take 1 tablet (81 mg total) by mouth daily. 90 tablet 3  . atorvastatin (LIPITOR) 40 MG tablet TAKE 1 TABLET BY MOUTH EVERY DAY 90 tablet 0  . Cholecalciferol (VITAMIN D3) 5000 units TABS Take 5,000 Units by mouth daily.    . citalopram (CELEXA) 20 MG tablet Take 20 mg by mouth daily.     . hydrochlorothiazide (HYDRODIURIL) 25 MG tablet Take 25 mg by mouth daily.    . metFORMIN (GLUCOPHAGE) 1000 MG tablet Take 1,000 mg by mouth 2 (two) times daily with a meal.     . metoprolol (LOPRESSOR) 100 MG tablet Take 100  mg by mouth 2 (two) times daily.     . traZODone (DESYREL) 100 MG tablet Take 100 mg by mouth at bedtime as needed for sleep.     Marland Kitchen NITROSTAT 0.4 MG SL tablet DISSOLVE ONE TABLET UNDER TONGUE AS NEEDED FOR CHEST PAIN EVERY 5 MINUTES 25 tablet 3  . traMADol (ULTRAM) 50 MG tablet Take 50 mg by mouth every 6 (six) hours as needed for moderate pain.       Results for orders placed or performed during the hospital encounter of 09/18/15 (from the past 48 hour(s))  Glucose, capillary     Status: Abnormal   Collection Time: 09/18/15  6:39 AM  Result Value Ref Range   Glucose-Capillary 109 (H) 65 - 99 mg/dL   Comment 1 Notify RN    Comment 2 Document in Chart    No results found.  Review of Systems  All other systems reviewed and are negative.   Blood pressure 120/39, pulse  58, temperature 98.6 F (37 C), temperature source Oral, resp. rate 20, weight 91.173 kg (201 lb), SpO2 99 %. Physical Exam  Constitutional: She is oriented to person, place, and time. She appears well-developed and well-nourished.  HENT:  Head: Atraumatic.  Eyes: EOM are normal.  Cardiovascular: Intact distal pulses.   Respiratory: Effort normal.  Musculoskeletal:  R shoulder pain with limited ROM.  Neurological: She is alert and oriented to person, place, and time.  Skin: Skin is warm and dry.  Psychiatric: She has a normal mood and affect.     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 Plan R reverse TSA Risks / benefits of surgery discussed Consent on chart  NPO for OR Preop antibiotics   Mable Paris, MD 09/18/2015, 7:09 AM

## 2015-09-18 NOTE — Progress Notes (Signed)
Utilization review completed. Anam Bobby, RN, BSN. 

## 2015-09-18 NOTE — Transfer of Care (Signed)
Immediate Anesthesia Transfer of Care Note  Patient: [name]  Procedure(s) Performed: Procedure(s) with comments: REVERSE SHOULDER ARTHROPLASTY (Right) - Right reverse total shoulder arthroplasty  Patient Location: PACU  Anesthesia Type:General and Regional  Level of Consciousness: awake, alert , oriented and sedated  Airway & Oxygen Therapy: Patient Spontanous Breathing and Patient connected to nasal cannula oxygen  Post-op Assessment: Report given to RN, Post -op Vital signs reviewed and stable and Patient moving all extremities  Post vital signs: Reviewed and stable  Last Vitals:  Filed Vitals:   09/18/15 0628  BP: 120/39  Pulse: 58  Temp: 37 C  Resp: 20    Complications: No apparent anesthesia complications

## 2015-09-18 NOTE — Anesthesia Postprocedure Evaluation (Signed)
Anesthesia Post Note  Patient: [name]  Procedure(s) Performed: Procedure(s) (LRB): REVERSE SHOULDER ARTHROPLASTY (Right)  Patient location during evaluation: PACU Anesthesia Type: General Level of consciousness: awake Pain management: pain level controlled Vital Signs Assessment: post-procedure vital signs reviewed and stable Respiratory status: spontaneous breathing Cardiovascular status: stable Anesthetic complications: no    Last Vitals:  Filed Vitals:   09/18/15 1106 09/18/15 1115  BP: 125/66   Pulse: 79 78  Temp:    Resp: 11 12    Last Pain:  Filed Vitals:   09/18/15 1240  PainSc: 5                  EDWARDS,Corben Auzenne

## 2015-09-18 NOTE — Progress Notes (Signed)
09/18/15 1400 nursing  To unit 5N patient from PACU accompanied by RN and NT; Pt is sleepy at the moment but arousable; IV ongoing and infusing well. S/P rt shoulder arthroplasty sling noted with ice pack at site. Daughter at bedside. Oriented to unit set up.

## 2015-09-18 NOTE — Care Management Note (Signed)
Case Management Note  Patient Details  Name: JORI FRERICHS MRN: [id number] Date of Birth: 1945/09/02  Subjective/Objective:   70 yr old female s/p reverse shoulder arthroplasty.                 Action/Plan:  Patient has no home health needs identified. CM spoke with patient's worker's comp adjuster Bonney Leitz, she states that they received order from patient's MD to ar5range for [facility] and she will handle that.    Expected Discharge Date:  09/19/15               Expected Discharge Plan:   Home/self care  In-House Referral:     Discharge planning Services     Post Acute Care Choice:    Choice offered to:     DME Arranged:   NA DME Agency:     HH Arranged:   NA HH Agency:     Status of Service:   Completed.  Medicare Important Message Given:    Date Medicare IM Given:    Medicare IM give by:    Date Additional Medicare IM Given:    Additional Medicare Important Message give by:     If discussed at Long Length of Stay Meetings, dates discussed:    Additional Comments: patient is under worker's comp. Case manager left voice message for Bonney Leitz @ [phone].      Durenda Guthrie, RN   09/18/2015, 3:17 PM Home with Home Heal

## 2015-09-18 NOTE — Op Note (Addendum)
Procedure(s): REVERSE SHOULDER ARTHROPLASTY Procedure Note  CORRYN MADEWELL female 70 y.o. 09/18/2015  Procedure(s) and Anesthesia Type:    * RIGHT REVERSE SHOULDER ARTHROPLASTY - General   Indications:  70 y.o. 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 Pain and dysfunction interfered with quality of life and nonoperative treatment with activity modification, NSAIDS, injections and arthroscopic debridement failed.     Surgeon: Mable Paris   Assistants: Damita Lack PA-C [facility] was present and scrubbed throughout the procedure and was essential in positioning, retraction, exposure, and closure)  Anesthesia: General endotracheal anesthesia with preoperative interscalene block given by the attending anesthesiologist    Procedure Detail  REVERSE SHOULDER ARTHROPLASTY   Estimated Blood Loss:  200 mL         Drains: none  Blood Given: none          Specimens: none        Complications:  * No complications entered in OR log *         Disposition: PACU - hemodynamically stable.         Condition: stable      OPERATIVE FINDINGS:  A DJO Altivate pressfit reverse total shoulder arthroplasty was placed with a  size 12 stem, a 32-4 glenosphere, and a standard poly insert. The base plate  fixation was excellent.  PROCEDURE: The patient was identified in the preoperative holding area  where I personally marked the operative site after verifying site, side,  and procedure with the patient. An interscalene block given by  the attending anesthesiologist in the holding area and the patient was taken back to the operating room where all extremities were  carefully padded in position after general anesthesia was induced. She  was placed in a beach-chair position and the operative upper extremity was  prepped and draped in a standard sterile fashion. The patient was given 1 g IV tranexamic acid at the time of the incision.   An approximately 10-  cm incision was made from the tip of the coracoid process to the center  point of the humerus at the level of the axilla. Dissection was carried  down through subcutaneous tissues to the level of the cephalic vein  which was taken laterally with the deltoid. The pectoralis major was  retracted medially. The subdeltoid space was developed and the lateral  edge of the conjoined tendon was identified. The undersurface of  conjoined tendon was palpated and the musculocutaneous nerve was not in  the field. Retractor was placed underneath the conjoined and second  retractor was placed lateral into the deltoid. The circumflex humeral  artery and vessels were identified and clamped and coagulated. The  biceps tendon was already tenotomized.  The subscapularis was peeled from the lesser tuberosity with the capsule.  The  joint was then gently externally rotated while the capsule was released  from the humeral neck around to just beyond the 6 o'clock position. At  this point, the joint was dislocated and the humeral head was presented  into the wound. The excessive osteophyte formation was removed with a  large rongeur.  The cutting guide was used to make the appropriate  head cut and the head was saved for potentially bone grafting.  The glenoid was exposed with the arm in an  abducted extended position. The anterior and posterior labrum were  completely excised and the capsule was released circumferentially to  allow for exposure of the glenoid for preparation. The 2.5 mm drill  was  placed using the guide in 5-10 inferior angulation and the tap was then advanced in the same hole. Small and large reamers were then used. The tap was then removed and the Metaglene was then screwed in with excellent purchase.  The peripheral guide was then used to drilled measured and filled peripheral locking screws. The size  32-4  glenosphere was then impacted on the [facility] taper and the central  screw was placed. The humerus was then again exposed and the diaphyseal reamers were used followed by the metaphyseal reamers. The final broach was left in place in the proximal trial was placed. The joint was reduced and with this implant it was felt that soft tissue tensioning was appropriate with excellent stability and excellent range of motion. Therefore, final humeral stem was placed press-fit with some bone grafting.  And then the trial polyethylene inserts were tested again and the above implant was felt to be the most appropriate for final insertion. The joint was reduced taken through full range of motion and felt to be stable. Soft tissue tension was appropriate.  The joint was then copiously irrigated with pulse  lavage and the wound was then closed. The subscapularis was not repaired.  Skin was closed with 2-0 Vicryl in a deep dermal layer and 4-0  Monocryl for skin closure. Steri-Strips were applied. Sterile  dressings were then applied as well as a sling. The patient was allowed  to awaken from general anesthesia, transferred to stretcher, and taken  to recovery room in stable condition.   POSTOPERATIVE PLAN: The patient will be kept in the hospital postoperatively  for pain control and therapy.

## 2015-09-19 ENCOUNTER — Encounter (HOSPITAL_COMMUNITY): Payer: Self-pay | Admitting: Orthopedic Surgery

## 2015-09-19 LAB — BASIC METABOLIC PANEL
ANION GAP: 9 (ref 5–15)
BUN: 17 mg/dL (ref 6–20)
CALCIUM: 8.5 mg/dL — AB (ref 8.9–10.3)
CO2: 26 mmol/L (ref 22–32)
Chloride: 101 mmol/L (ref 101–111)
Creatinine, Ser: 1.32 mg/dL — ABNORMAL HIGH (ref 0.44–1.00)
GFR calc Af Amer: 46 mL/min — ABNORMAL LOW (ref >60)
GFR calc non Af Amer: 40 mL/min — ABNORMAL LOW (ref >60)
GLUCOSE: 104 mg/dL — AB (ref 65–99)
POTASSIUM: 3.9 mmol/L (ref 3.5–5.1)
Sodium: 136 mmol/L (ref 135–145)

## 2015-09-19 LAB — CBC
HEMATOCRIT: 29.2 % — AB (ref 36.0–46.0)
Hemoglobin: 9.4 g/dL — ABNORMAL LOW (ref 12.0–15.0)
MCH: 30 pg (ref 26.0–34.0)
MCHC: 32.2 g/dL (ref 30.0–36.0)
MCV: 93.3 fL (ref 78.0–100.0)
Platelets: 197 10*3/uL (ref 150–400)
RBC: 3.13 MIL/uL — ABNORMAL LOW (ref 3.87–5.11)
RDW: 14.3 % (ref 11.5–15.5)
WBC: 7.4 10*3/uL (ref 4.0–10.5)

## 2015-09-19 LAB — GLUCOSE, CAPILLARY: Glucose-Capillary: 105 mg/dL — ABNORMAL HIGH (ref 65–99)

## 2015-09-19 NOTE — Progress Notes (Signed)
Patient d/c home with family left floor via wheelchair accompanied by volunteer. Discharge instructions RX's explained to patient and family verbalized understanding. No c/o pain or SOB at d/c.  Kaytelynn Scripter [employer]

## 2015-09-19 NOTE — Progress Notes (Signed)
Occupational Therapy Treatment Patient Details Name: [name] MRN: [id number] DOB: Feb 04, 1946 Today's Date: 09/19/2015    History of present illness s/p R reverse TSA   OT comments  Completed all education with pt/caregiver regarding ADL and RUE management. Pt/caregiver able to return demonstrate. Pt safe to D/C home with 24/7 S and assist initially with all mobility. Daughter verbalized understanding. Pt to follow up with Dr. Tamera Punt to progress rehab of RUE as needed.   Follow Up Recommendations  Supervision/Assistance - 24 hour    Equipment Recommendations  Other (comment) (showerseat - pt/caregiver to get)    Recommendations for Other Services      Precautions / Restrictions Precautions Precautions: Shoulder Type of Shoulder Precautions: conservative - no shoulder ROM. elbow wrist hand ROM only Shoulder Interventions: Shoulder sling/immobilizer;At all times;Off for dressing/bathing/exercises Precaution Booklet Issued: Yes (comment) Restrictions Weight Bearing Restrictions: Yes RUE Weight Bearing: Non weight bearing       Mobility    Transfers Overall transfer level: Needs assistance Equipment used: 1 person hand held assist Transfers: Sit to/from Stand;Stand Pivot Transfers Sit to Stand: Supervision Stand pivot transfers: Min guard       General transfer comment: improved balance as pt more alert    Balance Overall balance assessment: Needs assistance   Sitting balance-Leahy Scale: Good       Standing balance-Leahy Scale: Fair                     ADL Overall ADL's : Needs assistance/impaired                                     Functional mobility during ADLs: Minimal assistance (due to unsteadiness from pain meds) General ADL Comments: Completed education regarding ADL management with pt/daughter during ADL session. Pt/daughter able to return demonstrate. recommended pt sit when bathing and keep arm in "slinged position".  Pt/daughter able to return demonstrate. Sling management education also complete. Educated on reducing risk of falls within home. Completed education regarding edema and pain control with use of positioning and ice.       Vision                     Perception     Praxis      Cognition   Behavior During Therapy: [facility] for tasks assessed/performed Overall Cognitive Status: Within Functional Limits for tasks assessed                      Extremity/Trunk Assessment      Lower Extremity Assessment Lower Extremity Assessment: Overall WFL for tasks assessed   Cervical / Trunk Assessment Cervical / Trunk Assessment: Normal    Exercises Shoulder Exercises Elbow Flexion: AROM;AAROM;Right;10 reps;Seated Elbow Extension: AAROM;AROM;Right;10 reps;Seated Wrist Flexion: AROM;Right;10 reps Wrist Extension: AROM;Right;10 reps Digit Composite Flexion: AROM;Right;10 reps Composite Extension: AROM;Right;10 reps Donning/doffing shirt without moving shoulder: Caregiver independent with task;Patient able to independently direct caregiver Method for sponge bathing under operated UE: Caregiver independent with task;Patient able to independently direct caregiver Donning/doffing sling/immobilizer: Patient able to independently direct caregiver;Caregiver independent with task Correct positioning of sling/immobilizer: Caregiver independent with task ROM for elbow, wrist and digits of operated UE: Modified independent Sling wearing schedule (on at all times/off for ADL's): Independent;Patient able to independently direct caregiver Proper positioning of operated UE when showering: Independent;Patient able to independently direct caregiver Positioning of UE while  sleeping: Independent;Caregiver independent with task   Shoulder Instructions Shoulder Instructions Donning/doffing shirt without moving shoulder: Caregiver independent with task;Patient able to independently direct  caregiver Method for sponge bathing under operated UE: Caregiver independent with task;Patient able to independently direct caregiver Donning/doffing sling/immobilizer: Patient able to independently direct caregiver;Caregiver independent with task Correct positioning of sling/immobilizer: Caregiver independent with task ROM for elbow, wrist and digits of operated UE: Modified independent Sling wearing schedule (on at all times/off for ADL's): Independent;Patient able to independently direct caregiver Proper positioning of operated UE when showering: Independent;Patient able to independently direct caregiver Positioning of UE while sleeping: Independent;Caregiver independent with task     General Comments      Pertinent Vitals/ Pain       Pain Assessment: 0-10 Pain Score: 5  Pain Location: R shoulder Pain Descriptors / Indicators: Aching Pain Intervention(s): Limited activity within patient's tolerance;Repositioned;Ice applied  Home Living Family/patient expects to be discharged to:: Private residence Living Arrangements: Alone Available Help at Discharge: Family;Available 24 hours/day Type of Home: Apartment Home Access: Level entry     Home Layout: One level     Bathroom Shower/Tub: Tub/shower unit Shower/tub characteristics: Architectural technologist: Standard Bathroom Accessibility: Yes How Accessible: Accessible via walker Home Equipment: Grab bars - tub/shower;Bedside commode   Additional Comments: Has tub/shower combo with curtain and standard height toilet seats      Prior Functioning/Environment Level of Independence: Independent            Frequency Min 2X/week     Progress Toward Goals  OT Goals(current goals can now be found in the care plan section)  Progress towards OT goals: Goals met/education completed, patient discharged from OT  Acute Rehab OT Goals Patient Stated Goal: to go home OT Goal Formulation: With patient Time For Goal Achievement:  09/26/15 Potential to Achieve Goals: Good ADL Goals Pt/caregiver will Perform Home Exercise Program: Increased ROM;Right Upper extremity;With written HEP provided;With Supervision (per protocol; with caregiver independent in assisting) Additional ADL Goal #1: pt/caregiver demonstrate understanding independently of compensatory techniques for ADL  Plan All goals met and education completed, patient discharged from OT services    Co-evaluation                 End of Session     Activity Tolerance Patient tolerated treatment well   Patient Left in chair;with call bell/phone within reach   Nurse Communication Mobility status;Other (comment) (ready for D/C)        Time: 1100-1126 OT Time Calculation (min): 26 min  Charges: OT General Charges $OT Visit: 1 Procedure  OT Treatments $Self Care/Home Management : 8-22 mins $Therapeutic Exercise: 8-22 mins  Revella Shelton,HILLARY 09/19/2015, 1:03 PM   [facility], OTR/L  [phone] 09/19/2015

## 2015-09-19 NOTE — Progress Notes (Signed)
Occupational Therapy Evaluation Patient Details Name: [name] MRN: [id number] DOB: 12-Sep-1945 Today's Date: 09/19/2015    History of Present Illness s/p R reverse TSA   Clinical Impression   Evaluation limited due to lethargy. Pt mobilized to chair. Began education of compensatory techniques for ADL and RUE management. Written handouts given. Pt plans to D/C home and daughter will stay with her 24/7 as needed. Will return this am to complete education when pt less lethargic.     Follow Up Recommendations  No OT follow up;Supervision/Assistance - 24 hour (progress therapy as indicated by MD)    Equipment Recommendations       Recommendations for Other Services       Precautions / Restrictions Precautions Precautions: Shoulder Type of Shoulder Precautions: conservative - no shoulder ROM. elbow wrist hand ROM only Shoulder Interventions: Shoulder sling/immobilizer;At all times;Off for dressing/bathing/exercises Precaution Booklet Issued: Yes (comment) Restrictions Weight Bearing Restrictions: Yes RUE Weight Bearing: Non weight bearing      Mobility Bed Mobility Overal bed mobility: Needs Assistance Bed Mobility: Supine to Sit     Supine to sit: Min guard        Transfers Overall transfer level: Needs assistance Equipment used: 1 person hand held assist Transfers: Sit to/from [employer] Sit to Stand: Min assist Stand pivot transfers: Min assist       General transfer comment: steady assist    Balance Overall balance assessment: Needs assistance   Sitting balance-Leahy Scale: Good       Standing balance-Leahy Scale: Fair                              ADL Overall ADL's : Needs assistance/impaired                                     Functional mobility during ADLs: Minimal assistance (due to unsteadiness from pain meds) General ADL Comments: Max A for bathing and dressing. Will educate daughter      Vision     Perception     Praxis      Pertinent Vitals/Pain Pain Assessment: 0-10 Pain Score: 7  Pain Location: R shoulder Pain Descriptors / Indicators: Aching;Constant;Grimacing;Guarding Pain Intervention(s): Limited activity within patient's tolerance;Repositioned;Ice applied     Hand Dominance Right   Extremity/Trunk Assessment     Lower Extremity Assessment Lower Extremity Assessment: Overall WFL for tasks assessed   Cervical / Trunk Assessment Cervical / Trunk Assessment: Normal   Communication Communication Communication: No difficulties   Cognition Arousal/Alertness: Lethargic;Suspect due to medications Behavior During Therapy: Flat affect Overall Cognitive Status: Impaired/Different from baseline       Memory:  (poor awareness. lethargic from pain meds)             General Comments       Exercises       Shoulder Instructions      Home Living Family/patient expects to be discharged to:: Private residence Living Arrangements: Alone Available Help at Discharge: Family;Available 24 hours/day Type of Home: Apartment Home Access: Level entry     Home Layout: One level     Bathroom Shower/Tub: Tub/shower unit Shower/tub characteristics: Engineer, building services: Standard Bathroom Accessibility: Yes How Accessible: Accessible via walker Home Equipment: Grab bars - tub/shower;Bedside commode   Additional Comments: Has tub/shower combo with curtain and standard height toilet seats  Prior Functioning/Environment Level of Independence: Independent             OT Diagnosis: Generalized weakness;Acute pain   OT Problem List: Decreased strength;Decreased range of motion;Decreased activity tolerance;Impaired balance (sitting and/or standing);Decreased safety awareness;Decreased knowledge of use of DME or AE;Decreased knowledge of precautions;Obesity;Impaired UE functional use;Pain   OT Treatment/Interventions: Self-care/ADL  training;Therapeutic exercise;DME and/or AE instruction;Therapeutic activities;Patient/family education    OT Goals(Current goals can be found in the care plan section) Acute Rehab OT Goals Patient Stated Goal: to go home OT Goal Formulation: With patient Time For Goal Achievement: 09/26/15 Potential to Achieve Goals: Good ADL Goals Pt/caregiver will Perform Home Exercise Program: Increased ROM;Right Upper extremity;With written HEP provided;With Supervision (per protocol; with caregiver independent in assisting) Additional ADL Goal #1: pt/caregiver demonstrate understanding independently of compensatory techniques for ADL  OT Frequency: Min 2X/week   Barriers to D/C:            Co-evaluation              End of Session Nurse Communication: Mobility status;Precautions;Weight bearing status  Activity Tolerance: Patient limited by lethargy;Patient limited by pain Patient left: in chair;with call bell/phone within reach;with family/visitor present   Time: 9811-9147 OT Time Calculation (min): 20 min Charges:  OT General Charges $OT Visit: 1 Procedure OT Evaluation $OT Eval Moderate Complexity: 1 Procedure G-Codes:    Jourdyn Hasler,HILLARY October 13, 2015, 12:54 PM   Luisa Dago, OTR/L  [phone] 10/13/2015

## 2015-09-19 NOTE — Progress Notes (Signed)
   PATIENT ID: [name]   1 Day Post-Op Procedure(s) (LRB): REVERSE SHOULDER ARTHROPLASTY (Right)  Subjective: Pain in right shoulder 5/10. Had trouble sleeping overnight due to pain. No other complaints or concerns.  Objective:  Filed Vitals:   09/19/15 0018 09/19/15 0458  BP: 114/52 104/53  Pulse: 56 64  Temp: 97.6 F (36.4 C) 97.9 F (36.6 C)  Resp: 16 16     R UE dressing c/d/i Wiggles fingers, distally NVI  Labs:   Recent Labs  09/19/15 0540  HGB 9.4*   Recent Labs  09/19/15 0540  WBC 7.4  RBC 3.13*  HCT 29.2*  PLT 197   Recent Labs  09/19/15 0540  NA 136  K 3.9  CL 101  CO2 26  BUN 17  CREATININE 1.32*  GLUCOSE 104*  CALCIUM 8.5*    Assessment and Plan: 1 day s/p reverse TSA Work with OT today Continue pain mgmt with goal of po percocet ICE shoulder frequently Scripts for [employer] and colace signed D/c home today after cleared by OT and pain controlled   VTE proph: ASA  BID, SCDs

## 2015-09-25 NOTE — Discharge Summary (Signed)
Patient ID: [name] MRN: [id number] DOB/AGE: 11/01/1945 70 y.o.  Admit date: 09/18/2015 Discharge date: 09/25/2015  Admission Diagnoses:  Active Problems:   S/p reverse total shoulder arthroplasty   Discharge Diagnoses:  Same  Past Medical History  Diagnosis Date  . Atypical face pain   . Hypertension   . CAD (coronary artery disease)     Catheterization 2006, mild two-vessel nonobstructive disease  . GERD (gastroesophageal reflux disease)   . Headache(784.0)   . Hyperlipidemia   . Arthritis   . Sleep apnea     CPAP compliance ???  . Diabetes mellitus   . Shortness of breath   . PAD (peripheral artery disease) (HCC)     Dr. Darrick Penna,  Bilateral superficial femoral artery occlusions with one vessel runoff via the peroneal artery  . Ejection fraction     EF 60%, echo, October, 2010,  . Urinary incontinence   . Overweight(278.02)   . Mitral regurgitation     Mild, echo, October, 2010  . Fluid overload   . Foot pain, right     November, 2012  . UTI (lower urinary tract infection)   . CKD (chronic kidney disease), stage III   . Complication of anesthesia     "sometimes takes more medication to get me to sleep"  . Stroke [facility])     denies    Surgeries: Procedure(s): REVERSE SHOULDER ARTHROPLASTY on 09/18/2015   Consultants:    Discharged Condition: Improved  Hospital Course: [name] is an 70 y.o. 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 Patient has severe unremitting pain that affects sleep, daily activities, and work/hobbies. After pre-op clearance the patient was taken to the operating room on 09/18/2015 and underwent  Procedure(s): REVERSE SHOULDER ARTHROPLASTY.    Patient was given perioperative antibiotics:  Anti-infectives    Start     Dose/Rate Route Frequency Ordered Stop   09/18/15 1430  ceFAZolin (ANCEF) IVPB 2  g/50 mL premix     2 g 100 mL/hr over 30 Minutes Intravenous Every 6 hours 09/18/15 1406 09/19/15 0221   09/18/15 0700  clindamycin (CLEOCIN) IVPB 900 mg     900 mg 100 mL/hr over 30 Minutes Intravenous To [facility] 09/17/15 1050 09/18/15 0737       Patient was given sequential compression devices, early ambulation, and ASA 325mg  BID to prevent DVT.  Patient benefited maximally from hospital stay and there were no complications.    Recent vital signs: No data found.    Recent laboratory studies: No results for input(s): WBC, HGB, HCT, PLT, NA, K, CL, CO2, BUN, CREATININE, GLUCOSE, INR, CALCIUM in the last 72 hours.  Invalid input(s): PT, 2   Discharge Medications:     Medication List    TAKE these medications        allopurinol 300 MG tablet  Commonly known as:  ZYLOPRIM  Take 300 mg by mouth daily.     aspirin EC 81 MG tablet  Take 1 tablet (81 mg total) by mouth daily.     atorvastatin 40 MG tablet  Commonly known as:  LIPITOR  TAKE 1 TABLET BY MOUTH EVERY DAY     citalopram 20 MG tablet  Commonly known as:  CELEXA  Take 20 mg by mouth daily.     docusate sodium 100 MG capsule  Commonly known as:  COLACE  Take 1 capsule (100 mg total) by mouth  3 (three) times daily as needed.     hydrochlorothiazide 25 MG tablet  Commonly known as:  HYDRODIURIL  Take 25 mg by mouth daily.     metFORMIN 1000 MG tablet  Commonly known as:  GLUCOPHAGE  Take 1,000 mg by mouth 2 (two) times daily with a meal.     metoprolol 100 MG tablet  Commonly known as:  LOPRESSOR  Take 100 mg by mouth 2 (two) times daily.     NITROSTAT 0.4 MG SL tablet  Generic drug:  nitroGLYCERIN  DISSOLVE ONE TABLET UNDER TONGUE AS NEEDED FOR CHEST PAIN EVERY 5 MINUTES     oxyCODONE-acetaminophen 5-325 MG tablet  Commonly known as:  ROXICET  Take 1-2 tablets by mouth every 4 (four) hours as needed for severe pain.     traMADol 50 MG tablet  Commonly known as:  ULTRAM  Take 50 mg by  mouth every 6 (six) hours as needed for moderate pain.     traZODone 100 MG tablet  Commonly known as:  DESYREL  Take 100 mg by mouth at bedtime as needed for sleep.     Vitamin D3 5000 units Tabs  Take 5,000 Units by mouth daily.        Diagnostic Studies: Dg Chest 2 View  09/09/2015  CLINICAL DATA:  Preop for right shoulder arthroplasty EXAM: CHEST  2 VIEW COMPARISON:  Portable chest x-ray of 04/21/2015 and two-view chest x-ray of 03/18/2013 FINDINGS: No active infiltrate or effusion is seen. Mediastinal and hilar contours are unremarkable. The heart is within upper limits of normal. No acute bony abnormality is seen. IMPRESSION: No active cardiopulmonary disease. Electronically Signed   By: Dwyane Dee M.D.   On: 09/09/2015 15:39   Dg Shoulder Right Port  09/18/2015  CLINICAL DATA:  Patient status post reverse shoulder arthroplasty. EXAM: PORTABLE RIGHT SHOULDER - 2+ VIEW COMPARISON:  Chest radiograph 09/09/2015 FINDINGS: Single postprocedural radiograph, portable technique, demonstrates postprocedural hardware within the proximal right humerus. Hardware appears intact. Postsurgical changes within the overlying soft tissues. IMPRESSION: Patient status post reverse total shoulder arthroplasty. Electronically Signed   By: Annia Belt M.D.   On: 09/18/2015 11:45    Disposition: 01-Home or Self Care      Discharge Instructions    Call MD / Call 911    Complete by:  As directed   If you experience chest pain or shortness of breath, CALL 911 and be transported to the hospital emergency room.  If you develope a fever above 101 F, pus (white drainage) or increased drainage or redness at the wound, or calf pain, call your surgeon's office.     Call MD / Call 911    Complete by:  As directed   If you experience chest pain or shortness of breath, CALL 911 and be transported to the hospital emergency room.  If you develope a fever above 101 F, pus (white drainage) or increased drainage or redness  at the wound, or calf pain, call your surgeon's office.     Constipation Prevention    Complete by:  As directed   Drink plenty of fluids.  Prune juice may be helpful.  You may use a stool softener, such as Colace (over the counter) 100 mg twice a day.  Use MiraLax (over the counter) for constipation as needed.     Constipation Prevention    Complete by:  As directed   Drink plenty of fluids.  Prune juice may be helpful.  You may  use a stool softener, such as Colace (over the counter) 100 mg twice a day.  Use MiraLax (over the counter) for constipation as needed.     Diet - low sodium heart healthy    Complete by:  As directed      Diet - low sodium heart healthy    Complete by:  As directed      Increase activity slowly as tolerated    Complete by:  As directed      Increase activity slowly as tolerated    Complete by:  As directed            Follow-up Information    Follow up with Mable Paris, MD. Schedule an appointment as soon as possible for a visit in 2 weeks.   Specialty:  Orthopedic Surgery   Contact information:   [address] [place] [address] [phone]        Signed: Jiles Harold 09/25/2015, 10:42 AM

## 2015-10-06 ENCOUNTER — Other Ambulatory Visit: Payer: Self-pay | Admitting: Cardiology

## 2015-10-07 ENCOUNTER — Other Ambulatory Visit: Payer: Self-pay | Admitting: Interventional Cardiology

## 2015-10-07 NOTE — Telephone Encounter (Signed)
Rosalio MacadamiaLori C Gerhardt, NP at 05/05/2015 1:35 PM  atorvastatin (LIPITOR) 40 MG tabletTAKE 1 TABLET BY MOUTH EVERY DAY Medication Instructions:    Continue with your current medicines.   Get some over the counter Prilosec or Nexium and take one a day  Notes Recorded by Rosalio MacadamiaLori C Gerhardt, NP on 05/06/2015 at 7:36 AM Ok to report. Labs are stable. Anemia is stable.  Send labs and my note to her PCP

## 2015-12-16 ENCOUNTER — Other Ambulatory Visit: Payer: Self-pay | Admitting: Interventional Cardiology

## 2016-04-30 ENCOUNTER — Other Ambulatory Visit: Payer: Self-pay | Admitting: Interventional Cardiology

## 2016-05-03 ENCOUNTER — Other Ambulatory Visit: Payer: Self-pay | Admitting: Cardiology

## 2016-06-21 ENCOUNTER — Other Ambulatory Visit: Payer: Self-pay | Admitting: Cardiology

## 2016-06-28 ENCOUNTER — Telehealth: Payer: Self-pay | Admitting: Interventional Cardiology

## 2016-06-28 NOTE — Telephone Encounter (Signed)
[name] is calling to get the status of a Provider Clery faxed on Nov 27 to [phone]

## 2016-06-28 NOTE — Telephone Encounter (Signed)
**Note De-Identified Bostyn Kunkler Obfuscation** I called Lyca back and advised her that the pt was a pt of Dr Myrtis SerKatz prior to his retirement last year and that she was scheduled to see Dr Eldridge DaceVaranasi on 08/08/15 but no showed that appt. Lyca verbalized understanding and thanked me for calling her back.

## 2016-08-26 ENCOUNTER — Other Ambulatory Visit: Payer: Self-pay | Admitting: Cardiology

## 2016-08-27 NOTE — Telephone Encounter (Signed)
Patient saw Lawson FiscalLori and was to follow up with Dr Eldridge DaceVaranasi but she never did. Please advise on refill request. Thanks, MI

## 2016-08-27 NOTE — Telephone Encounter (Signed)
This is Dr [name] pt who has not had f/u with new cardiologist. I cannot advised as Dr [name] is retired. Thanks.

## 2016-08-27 NOTE — Telephone Encounter (Signed)
Patient has been given several refills for a quantity of fifteen with a note to call and schedule. Patient has failed to do so. Please advise on request. Thanks, MI

## 2016-12-15 IMAGING — CR DG SHOULDER 2+V PORT*R*
1 series · 1 of 1 positions shown · non-contrast
Comparison: Chest radiograph 09/09/2015

CLINICAL DATA: Patient status post reverse shoulder arthroplasty.

EXAM:
PORTABLE RIGHT SHOULDER - 2+ VIEW

[AP]
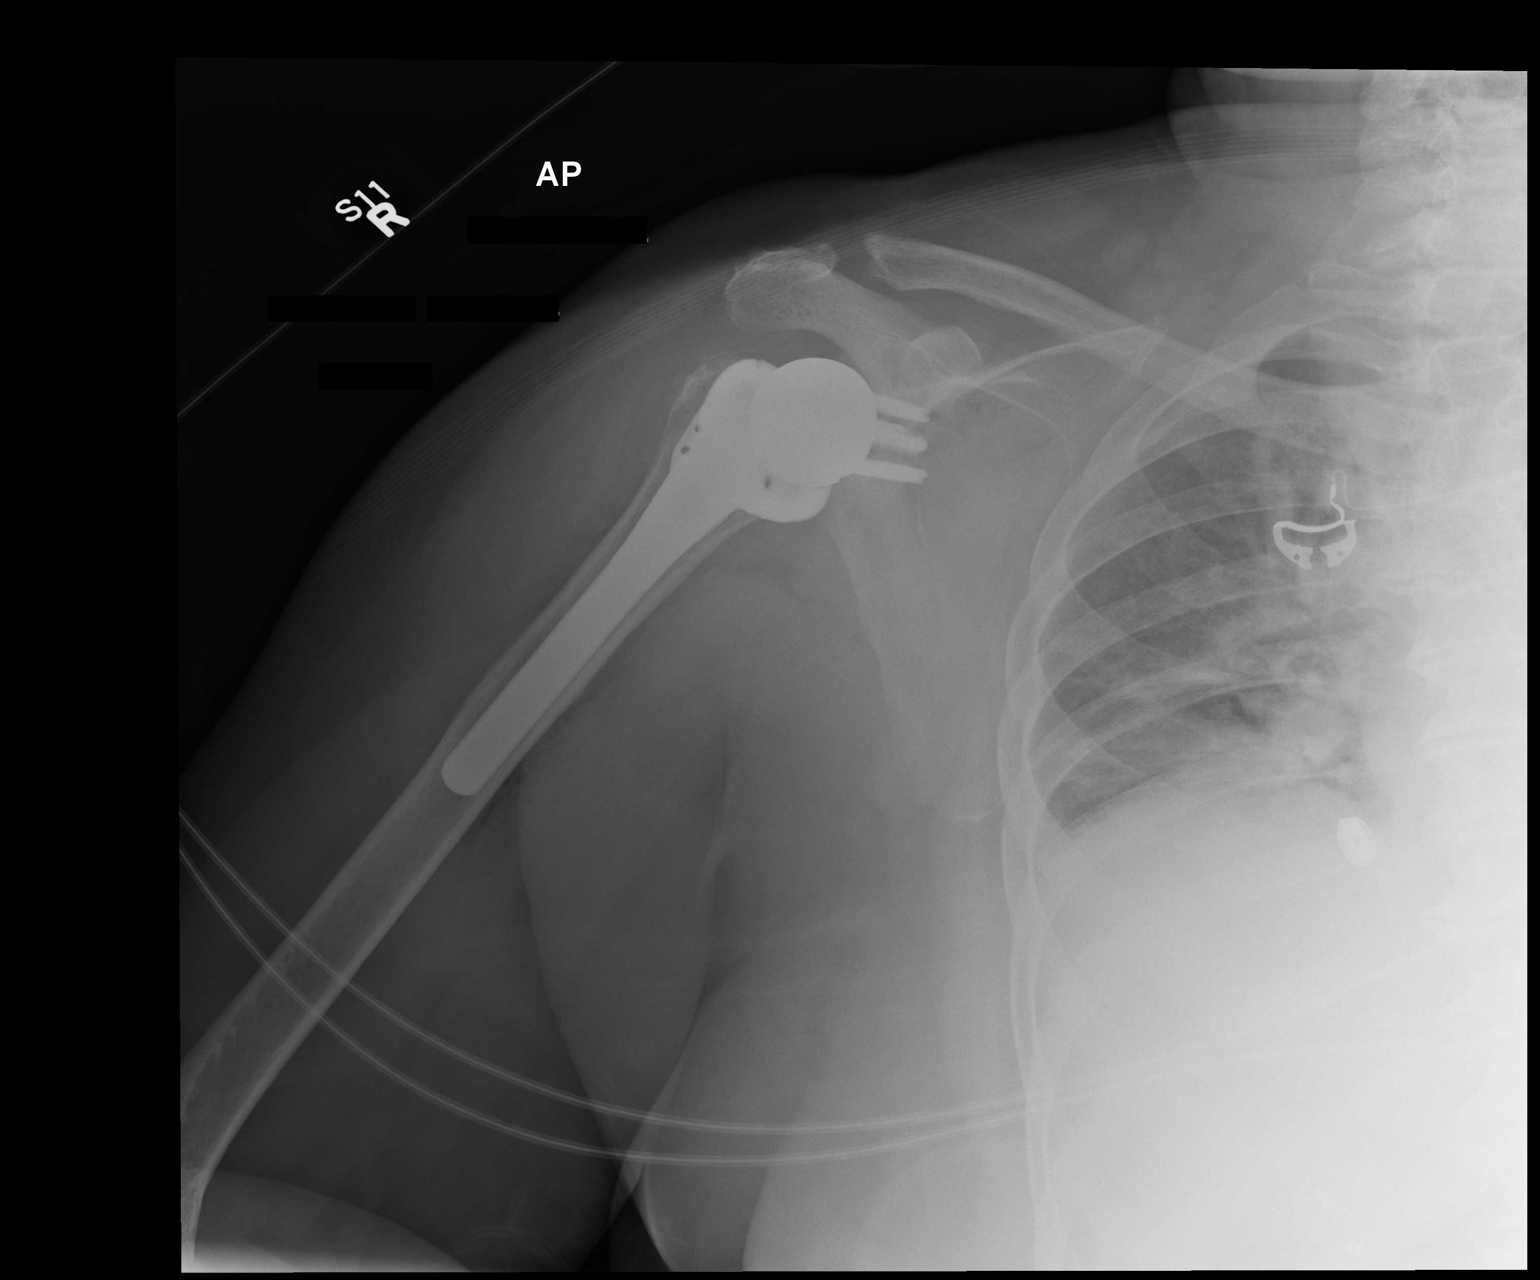

[1 of 1 positions shown; findings below may reference images not displayed]

FINDINGS: Single postprocedural radiograph, portable technique, demonstrates
postprocedural hardware within the proximal right humerus. Hardware
appears intact. Postsurgical changes within the overlying soft
tissues.
IMPRESSION: Patient status post reverse total shoulder arthroplasty.

## 2017-03-03 NOTE — Progress Notes (Signed)
Cardiology Office Note   Date:  03/04/2017   ID:  [name], DOB 1946-05-29, MRN [id number]  PCP:  Barbie BannerWilson, Fred H, MD  Cardiologist:  Dr. Eldridge DaceVaranasi     Chief Complaint  Patient presents with  . Dizziness    Lightheaded/1 year check up      History of Present Illness: [name] is a 71 y.o. female who presents for CAD.  PMH of CAD, hypertension, hyperlipidemia, diabetes mellitus, GERD, L2, OSA, PAD, mitral valve regurgitation, history of stroke, diastolic congestive heart failure (EF 60-65%), & CKD-III.   Seen in 2016 post cath with chest pain -cath with non obstructive disease.  Also with anemia  Today she has episodic chest pain that wakens her from sleep and she takes NTG and ASA and tylenol #3 and it improves.    She complains of lightheadedness mostly when first gets up from sitting to standing. She also has URI that has been treated but continues.  Her voice is hoarse today .     Past Medical History:  Diagnosis Date  . Arthritis   . Atypical face pain   . CAD (coronary artery disease)    Catheterization 2006, mild two-vessel nonobstructive disease  . CKD (chronic kidney disease), stage III   . Complication of anesthesia    "sometimes takes more medication to get me to sleep"  . Diabetes mellitus   . Ejection fraction    EF 60%, echo, October, 2010,  . Fluid overload   . Foot pain, right    November, 2012  . GERD (gastroesophageal reflux disease)   . Headache(784.0)   . Hyperlipidemia   . Hypertension   . Mitral regurgitation    Mild, echo, October, 2010  . Overweight(278.02)   . PAD (peripheral artery disease) (HCC)    Dr. Darrick PennaFields,  Bilateral superficial femoral artery occlusions with one vessel runoff via the peroneal artery  . Shortness of breath   . Sleep apnea    CPAP compliance ???  . Stroke [facility](HCC)    denies  . Urinary incontinence   . UTI (lower urinary tract infection)     Past Surgical History:  Procedure Laterality Date  .  ABDOMINAL HYSTERECTOMY    . ARTERY BIOPSY  12/27/2011   Procedure: BIOPSY TEMPORAL ARTERY;  Surgeon: Sherren Kernsharles E Fields, MD;  [facility];  Service: Vascular;  Laterality: Left;  . CARDIAC CATHETERIZATION N/A 04/23/2015   Procedure: Left Heart Cath and Coronary Angiography;  Surgeon: Corky CraftsJayadeep S Varanasi, MD;  [facility];  Service: Cardiovascular;  Laterality: N/A;  . EYE SURGERY Bilateral 12/11/10   Cataract  . REVERSE SHOULDER ARTHROPLASTY Right 09/18/2015   Procedure: REVERSE SHOULDER ARTHROPLASTY;  Surgeon: Jones BroomJustin Chandler, MD;  Location: [facility] OR;  Service: Orthopedics;  Laterality: Right;  Right reverse total shoulder arthroplasty  . REVERSE TOTAL SHOULDER ARTHROPLASTY Right 09/18/2015  . SHOULDER ARTHROSCOPY Right 02/2015  . VESICOVAGINAL FISTULA CLOSURE W/ TAH       Current Outpatient Prescriptions  Medication Sig Dispense Refill  . allopurinol (ZYLOPRIM) 300 MG tablet Take 300 mg by mouth daily.     Marland Kitchen. aspirin EC 81 MG tablet Take 1 tablet (81 mg total) by mouth daily. 90 tablet 3  . atorvastatin (LIPITOR) 40 MG tablet Take 1 tablet (40 mg total) by mouth daily. *PATIENT NEEDS AN APPOINTMENT FOR FURTHER REFILLS TO BE GRANTED OR OBTAIN FROM PCP* 30 tablet 1  . buPROPion (WELLBUTRIN XL) 150 MG 24 hr tablet Take  150 mg by mouth daily.    . Cholecalciferol (VITAMIN D3) 5000 units TABS Take 5,000 Units by mouth daily.    . citalopram (CELEXA) 20 MG tablet Take 20 mg by mouth daily.     Marland Kitchen docusate sodium (COLACE) 100 MG capsule Take 1 capsule (100 mg total) by mouth 3 (three) times daily as needed. 20 capsule 0  . hydrochlorothiazide (HYDRODIURIL) 25 MG tablet Take 25 mg by mouth daily.    . metFORMIN (GLUCOPHAGE) 1000 MG tablet Take 1,000 mg by mouth 2 (two) times daily with a meal.     . metoprolol (LOPRESSOR) 100 MG tablet Take 100 mg by mouth 2 (two) times daily.     Marland Kitchen NITROSTAT 0.4 MG SL tablet DISSOLVE ONE TABLET UNDER TONGUE AS NEEDED FOR CHEST PAIN EVERY 5 MINUTES 25  tablet 3  . oxyCODONE-acetaminophen (ROXICET) 5-325 MG tablet Take 1-2 tablets by mouth every 4 (four) hours as needed for severe pain. 60 tablet 0  . traMADol (ULTRAM) 50 MG tablet Take 50 mg by mouth every 6 (six) hours as needed for moderate pain.     . traZODone (DESYREL) 100 MG tablet Take 100 mg by mouth at bedtime as needed for sleep.      No current facility-administered medications for this visit.     Allergies:   Latex and Amoxicillin    Social History:  The patient  reports that she quit smoking about 14 years ago. Her smoking use included Cigarettes. She quit after 45.00 years of use. She has never used smokeless tobacco. She reports that she drinks alcohol. She reports that she does not use drugs.   Family History:  The patient's family history includes Diabetes in her mother and sister; Heart disease in her father; Hyperlipidemia in her mother and sister; Hypertension in her mother and sister.    ROS:  General:no colds or fevers, no weight changes Skin:no rashes or ulcers HEENT:no blurred vision, no congestion CV:see HPI PUL:see HPI GI:no diarrhea constipation or melena, no indigestion GU:no hematuria, no dysuria MS:no joint pain, no claudication Neuro:no syncope, no lightheadedness Endo:+ diabetes, no thyroid disease  Wt Readings from Last 3 Encounters:  03/04/17 186 lb 12.8 oz (84.7 kg)  09/18/15 201 lb (91.2 kg)  09/09/15 201 lb 4.8 oz (91.3 kg)     PHYSICAL EXAM: VS:  BP 134/80   Pulse 64   Ht 5\' 2"  (1.575 m)   Wt 186 lb 12.8 oz (84.7 kg)   BMI 34.17 kg/m  , BMI Body mass index is 34.17 kg/m. General:Pleasant affect, NAD Skin:Warm and dry, brisk capillary refill HEENT:normocephalic, sclera clear, mucus membranes moist Neck:supple, no JVD, no bruits  Heart:S1S2 RRR without murmur, gallup, rub or click Lungs:clear without rales, rhonchi, or wheezes ZOX:WRUE, non tender, + BS, do not palpate liver spleen or masses Ext:no lower ext edema, 2+ pedal pulses,  2+ radial pulses Neuro:alert and oriented X 3, MAE, follows commands, + facial symmetry    EKG:  EKG is ordered today. The ekg ordered today demonstrates SR LAD, minimal criteria LVH, non specific T wave abnormality no changes from previous.       Recent Labs: No results found for requested labs within last 8760 hours.    Lipid Panel    Component Value Date/Time   CHOL 110 04/22/2015 0350   TRIG 143 04/22/2015 0350   HDL 27 (L) 04/22/2015 0350   CHOLHDL 4.1 04/22/2015 0350   VLDL 29 04/22/2015 0350   LDLCALC 54 04/22/2015  1610       Other studies Reviewed: Additional studies/ records that were reviewed today include: Marland Kitchen  Myoview 04/22/15     Study Result      There was no ST segment deviation noted during stress.  T wave inversion was noted during stress in the II, III, aVF, V3, V4, V5 and V6 leads, beginning at 0 minutes of stress, ending at 5 minutes of stress, and returning to baseline after less than 1 min of recovery.  Defect 1: There is a small defect of mild severity present in the mid anteroseptal, apical anterior and apical septal location.  Defect 2: There is a small defect of mild severity present in the basal anteroseptal and basal inferoseptal location.  This is an intermediate risk study.  The left ventricular ejection fraction is normal (55-65%).  Nuclear stress EF: 65%.                 Coronary Findings from 03/2015    Dominance: Right    Left Anterior Descending  The vessel exhibits minimal luminal irregularities.      Left Circumflex  The vessel exhibits minimal luminal irregularities.          Right Coronary Artery  There is mild the vessel.   . Right Posterior Descending Artery   There is mild in the vessel.        Left Heart     Aortic Valve There is no aortic valve stenosis.      Coronary Diagrams        Diagnostic Diagram           Echo Study Conclusions from 03/2015  - Left ventricle: The  cavity size was normal. Systolic function was normal. The estimated ejection fraction was in the range of 60% to 65%. Wall motion was normal; there were no regional wall motion abnormalities. There was an increased relative contribution of atrial contraction to ventricular filling. Doppler parameters are consistent with abnormal left ventricular relaxation (grade 1 diastolic dysfunction). - Aortic valve: Moderate focal calcification. - Tricuspid valve: There was trivial regurgitation. - Pulmonic valve: There was mild regurgitation.     ASSESSMENT AND PLAN:  1.  CAD minimal on cath 2016.  + chest pain , wakes her from sleep, may be GI but she is diabetic so will plan lexiscan myoview - if neg then continue PPI   Will add protonix today. For 2 months  Follow up in 6 months with Dr. Eldridge Dace , if study + then will discuss with Dr. Eldridge Dace for other plans such a cath.  2.  HTN controlled    3. Chronic diastolic HF with G 1 DD.  Euvolemic today  4.  Anemia chronic HGB is 11.1 in March this year  5. DM-2 controlled and followed by PCP.   6. HLD on lipitor-- LDL was 30 on last check in March. Followed by PCP  7. dizziness she is dizzy with minimal BP changes believe this is related to her URI - have given recommendations for allegra or clarinex for allergies and Nasacort nasal spray if symptoms continue see PCP  8. Wt loss, she does drink a protein drink for BK, she wt loss continues she will see PCP, she states she doesn't have an appetite, though this too could be related to URI.   9. OSA on CPAP followed by PCP   Current medicines are reviewed with the patient today.  The patient Has no concerns regarding medicines.  The following changes  have been made:  See above Labs/ tests ordered today include:see above  Disposition:   FU:  see above  Signed, Nada Boozer, NP  03/04/2017 12:11 PM    [facility] [address] [place], [place], [place]   [address]/ [address] [place], [place] Phone: [phone]; Fax: [phone]  [phone]

## 2017-03-04 ENCOUNTER — Other Ambulatory Visit: Payer: Self-pay | Admitting: Cardiology

## 2017-03-04 ENCOUNTER — Ambulatory Visit (INDEPENDENT_AMBULATORY_CARE_PROVIDER_SITE_OTHER): Payer: Medicare Other | Admitting: Cardiology

## 2017-03-04 ENCOUNTER — Encounter: Payer: Self-pay | Admitting: Cardiology

## 2017-03-04 VITALS — BP 134/80 | HR 64 | Ht 62.0 in | Wt 186.8 lb

## 2017-03-04 DIAGNOSIS — R42 Dizziness and giddiness: Secondary | ICD-10-CM

## 2017-03-04 DIAGNOSIS — I10 Essential (primary) hypertension: Secondary | ICD-10-CM | POA: Diagnosis not present

## 2017-03-04 DIAGNOSIS — G4733 Obstructive sleep apnea (adult) (pediatric): Secondary | ICD-10-CM | POA: Diagnosis not present

## 2017-03-04 DIAGNOSIS — I5032 Chronic diastolic (congestive) heart failure: Secondary | ICD-10-CM

## 2017-03-04 DIAGNOSIS — R079 Chest pain, unspecified: Secondary | ICD-10-CM

## 2017-03-04 DIAGNOSIS — D649 Anemia, unspecified: Secondary | ICD-10-CM | POA: Diagnosis not present

## 2017-03-04 DIAGNOSIS — I2510 Atherosclerotic heart disease of native coronary artery without angina pectoris: Secondary | ICD-10-CM | POA: Diagnosis not present

## 2017-03-04 DIAGNOSIS — E781 Pure hyperglyceridemia: Secondary | ICD-10-CM

## 2017-03-04 DIAGNOSIS — Z9989 Dependence on other enabling machines and devices: Secondary | ICD-10-CM

## 2017-03-04 DIAGNOSIS — E119 Type 2 diabetes mellitus without complications: Secondary | ICD-10-CM | POA: Diagnosis not present

## 2017-03-04 MED ORDER — PANTOPRAZOLE SODIUM 40 MG PO TBEC: 40.0000 mg | DELAYED_RELEASE_TABLET | Freq: Every day | ORAL | 0 refills | Status: DC

## 2017-03-04 NOTE — Patient Instructions (Addendum)
Medication Instructions:  Your physician has recommended you make the following change in your medication: 1.) START Protonix 40 mg daily.    Labwork: None Ordered   Testing/Procedures: Your physician has requested that you have a lexiscan myoview. For further information please visit [email or web address]. Please follow instruction sheet, as given.  Please hold all of your medications the morning of your test.      Follow-Up: Your physician wants you to follow-up in: 6 months with Dr. Eldridge DaceVaranasi. You will receive a reminder letter in the mail two months in advance. If you don't receive a letter, please call our office to schedule the follow-up appointment.   Any Other Special Instructions Will Be Listed Below (If Applicable).  You may take over the counter Clarinex, Allregra, and Nasacort for your allergies.    If you need a refill on your cardiac medications before your next appointment, please call your pharmacy.

## 2017-03-08 NOTE — Addendum Note (Signed)
Addended by: Etheleen MayhewOX, Ngoc Daughtridge C on: 03/08/2017 07:27 AM   Modules accepted: Orders

## 2017-03-14 ENCOUNTER — Telehealth (HOSPITAL_COMMUNITY): Payer: Self-pay | Admitting: *Deleted

## 2017-03-14 NOTE — Telephone Encounter (Signed)
Patient given detailed instructions per Myocardial Perfusion Study Information Sheet for the test on  03/16/17 Patient notified to arrive 15 minutes early and that it is imperative to arrive on time for appointment to keep from having the test rescheduled.  If you need to cancel or reschedule your appointment, please call the office within 24 hours of your appointment. . Patient verbalized understanding. Crissie Aloi Jacqueline    

## 2017-03-16 ENCOUNTER — Encounter (HOSPITAL_COMMUNITY): Payer: Self-pay

## 2017-03-17 ENCOUNTER — Telehealth (HOSPITAL_COMMUNITY): Payer: Self-pay | Admitting: *Deleted

## 2017-03-17 NOTE — Telephone Encounter (Signed)
Left message on voicemail in reference to upcoming appointment scheduled for  03/22/17. Phone number given for a call back so details instructions can be given.  Trapper Meech Jacqueline   

## 2017-03-22 ENCOUNTER — Ambulatory Visit (HOSPITAL_COMMUNITY): Payer: Medicare Other | Attending: Cardiology

## 2017-03-22 DIAGNOSIS — E119 Type 2 diabetes mellitus without complications: Secondary | ICD-10-CM | POA: Diagnosis not present

## 2017-03-22 DIAGNOSIS — I10 Essential (primary) hypertension: Secondary | ICD-10-CM | POA: Diagnosis not present

## 2017-03-22 DIAGNOSIS — R0609 Other forms of dyspnea: Secondary | ICD-10-CM | POA: Insufficient documentation

## 2017-03-22 DIAGNOSIS — R079 Chest pain, unspecified: Secondary | ICD-10-CM | POA: Insufficient documentation

## 2017-03-22 DIAGNOSIS — I2510 Atherosclerotic heart disease of native coronary artery without angina pectoris: Secondary | ICD-10-CM | POA: Insufficient documentation

## 2017-03-22 LAB — MYOCARDIAL PERFUSION IMAGING
CHL CUP NUCLEAR SDS: 1
CHL CUP NUCLEAR SRS: 4
CHL CUP RESTING HR STRESS: 55 {beats}/min
CSEPPHR: 68 {beats}/min
LHR: 0.37
LV dias vol: 65 mL (ref 46–106)
LVSYSVOL: 28 mL
NUC STRESS TID: 1.07
SSS: 3

## 2017-03-22 MED ORDER — TECHNETIUM TC 99M TETROFOSMIN IV KIT
32.5000 | PACK | Freq: Once | INTRAVENOUS | Status: AC | PRN
  Administered 2017-03-22: 32.5 via INTRAVENOUS
  Filled 2017-03-22: qty 33

## 2017-03-22 MED ORDER — TECHNETIUM TC 99M TETROFOSMIN IV KIT
10.7000 | PACK | Freq: Once | INTRAVENOUS | Status: AC | PRN
  Administered 2017-03-22: 10.7 via INTRAVENOUS
  Filled 2017-03-22: qty 11

## 2017-03-22 MED ORDER — REGADENOSON 0.4 MG/5ML IV SOLN
0.4000 mg | Freq: Once | INTRAVENOUS | Status: AC
  Administered 2017-03-22: 0.4 mg via INTRAVENOUS

## 2017-05-05 ENCOUNTER — Telehealth: Payer: Self-pay | Admitting: *Deleted

## 2017-05-05 DIAGNOSIS — K219 Gastro-esophageal reflux disease without esophagitis: Secondary | ICD-10-CM

## 2017-05-05 NOTE — Telephone Encounter (Signed)
Advised of results and referral placed

## 2017-05-05 NOTE — Telephone Encounter (Signed)
-----   Message from Chilton Si, MD sent at 05/03/2017  1:00 PM EDT ----- Recommend that she see GI.  May need EGD.

## 2018-01-03 ENCOUNTER — Encounter: Payer: Self-pay | Admitting: Interventional Cardiology

## 2018-01-03 ENCOUNTER — Ambulatory Visit: Payer: Medicare Other | Admitting: Interventional Cardiology

## 2018-01-03 VITALS — BP 132/72 | HR 60 | Ht 62.0 in | Wt 179.0 lb

## 2018-01-03 DIAGNOSIS — E119 Type 2 diabetes mellitus without complications: Secondary | ICD-10-CM

## 2018-01-03 DIAGNOSIS — I10 Essential (primary) hypertension: Secondary | ICD-10-CM | POA: Diagnosis not present

## 2018-01-03 DIAGNOSIS — I2510 Atherosclerotic heart disease of native coronary artery without angina pectoris: Secondary | ICD-10-CM | POA: Diagnosis not present

## 2018-01-03 DIAGNOSIS — I5032 Chronic diastolic (congestive) heart failure: Secondary | ICD-10-CM

## 2018-01-03 NOTE — Patient Instructions (Signed)
Medication Instructions:  Your physician recommends that you continue on your current medications as directed. Please refer to the Current Medication list given to you today.   Labwork: None ordered  Testing/Procedures: None ordered  Follow-Up: Your physician wants you to follow-up with Dr. Varanasi AS NEEDED   Any Other Special Instructions Will Be Listed Below (If Applicable).     If you need a refill on your cardiac medications before your next appointment, please call your pharmacy.   

## 2018-01-03 NOTE — Progress Notes (Signed)
Cardiology Office Note   Date:  01/03/2018   ID:  [name], DOB [birth year], MRN [id number]  PCP:  Barbie Flesch, MD  Mikael Spray, MD- [facility]    No chief complaint on file.  CAD  Wt Readings from Last 3 Encounters:  01/03/18 179 lb (81.2 kg)  03/22/17 186 lb (84.4 kg)  03/04/17 186 lb 12.8 oz (84.7 kg)       History of Present Illness: [name] is a 72 y.o. female  who presents for CAD- nonobstructive in the past.  PMH of CAD, hypertension, hyperlipidemia, diabetes mellitus, GERD, L2, OSA, PAD, mitral valve regurgitation, history of stroke, diastolic congestive heart failure (EF 60-65%), &CKD-III.   Seen in 2016 post cath with chest pain -cath with non obstructive disease.  Also with anemia.  She continues to have intermittent chest pain.  Bllod work was checked and was ok.  She has had some recent kidney issues and her metformin was stopped.   Denies :  Dizziness. Leg edema. Nitroglycerin use. Orthopnea. Palpitations. Paroxysmal nocturnal dyspnea. Shortness of breath. Syncope.   Walking limited by back and leg pain.      Past Medical History:  Diagnosis Date  . Arthritis   . Atypical face pain   . CAD (coronary artery disease)    Catheterization 2006, mild two-vessel nonobstructive disease  . CKD (chronic kidney disease), stage III (HCC)   . Complication of anesthesia    "sometimes takes more medication to get me to sleep"  . Diabetes mellitus   . Ejection fraction    EF 60%, echo, October, 2010,  . Fluid overload   . Foot pain, right    November, 2012  . GERD (gastroesophageal reflux disease)   . Headache(784.0)   . Hyperlipidemia   . Hypertension   . Mitral regurgitation    Mild, echo, October, 2010  . Overweight(278.02)   . PAD (peripheral artery disease) (HCC)    Dr. Darrick Penna,  Bilateral superficial femoral artery occlusions with one vessel runoff via the peroneal artery  . Shortness of breath   . Sleep apnea    CPAP compliance ???  . Stroke [facility])    denies  . Urinary incontinence   . UTI (lower urinary tract infection)     Past Surgical History:  Procedure Laterality Date  . ABDOMINAL HYSTERECTOMY    . ARTERY BIOPSY  12/27/2011   Procedure: BIOPSY TEMPORAL ARTERY;  Surgeon: Sherren Kerns, MD;  [facility]: [facility] OR;  Service: Vascular;  Laterality: Left;  . CARDIAC CATHETERIZATION N/A 04/23/2015   Procedure: Left Heart Cath and Coronary Angiography;  Surgeon: Corky Crafts, MD;  Location: [facility] INVASIVE CV LAB;  Service: Cardiovascular;  Laterality: N/A;  . EYE SURGERY Bilateral 12/11/10   Cataract  . REVERSE SHOULDER ARTHROPLASTY Right 09/18/2015   Procedure: REVERSE SHOULDER ARTHROPLASTY;  Surgeon: Jones Broom, MD;  Location: [facility] OR;  Service: Orthopedics;  Laterality: Right;  Right reverse total shoulder arthroplasty  . REVERSE TOTAL SHOULDER ARTHROPLASTY Right 09/18/2015  . SHOULDER ARTHROSCOPY Right 02/2015  . VESICOVAGINAL FISTULA CLOSURE W/ TAH       Current Outpatient Medications  Medication Sig Dispense Refill  . acetaminophen-codeine (TYLENOL #3) 300-30 MG tablet Take 1 tablet by mouth as needed for pain.    Marland Kitchen allopurinol (ZYLOPRIM) 300 MG tablet Take 300 mg by mouth daily.     Marland Kitchen aspirin EC 81 MG tablet Take 1 tablet (81 mg total) by mouth daily. 90 tablet  3  . atorvastatin (LIPITOR) 40 MG tablet Take 1 tablet (40 mg total) by mouth daily. *PATIENT NEEDS AN APPOINTMENT FOR FURTHER REFILLS TO BE GRANTED OR OBTAIN FROM PCP* 30 tablet 1  . buPROPion (WELLBUTRIN XL) 150 MG 24 hr tablet Take 150 mg by mouth daily.    . Cholecalciferol (VITAMIN D3) 5000 units TABS Take 5,000 Units by mouth daily.    . citalopram (CELEXA) 20 MG tablet Take 20 mg by mouth daily.     . metoprolol (LOPRESSOR) 100 MG tablet Take 100 mg by mouth 2 (two) times daily.     Marland Kitchen. NITROSTAT 0.4 MG SL tablet DISSOLVE ONE TABLET UNDER TONGUE AS NEEDED FOR CHEST PAIN EVERY 5 MINUTES 25 tablet 3  . oxybutynin (DITROPAN) 5  MG tablet Take 10 mg by mouth daily.    Marland Kitchen. oxyCODONE-acetaminophen (ROXICET) 5-325 MG tablet Take 1-2 tablets by mouth every 4 (four) hours as needed for severe pain. 60 tablet 0  . PROAIR HFA 108 (90 Base) MCG/ACT inhaler Inhale 2 puffs into the lungs 4 (four) times daily.    Marland Kitchen. spironolactone (ALDACTONE) 25 MG tablet Take 25 mg by mouth daily.    . traMADol (ULTRAM) 50 MG tablet Take 50 mg by mouth every 6 (six) hours as needed for moderate pain.     . traZODone (DESYREL) 100 MG tablet Take 100 mg by mouth at bedtime as needed for sleep.      No current facility-administered medications for this visit.     Allergies:   Latex and Amoxicillin    Social History:  The patient  reports that she quit smoking about 14 years ago. Her smoking use included cigarettes. She quit after 45.00 years of use. She has never used smokeless tobacco. She reports that she drinks alcohol. She reports that she does not use drugs.   Family History:  The patient's family history includes Diabetes in her mother and sister; Heart disease in her father; Hyperlipidemia in her mother and sister; Hypertension in her mother and sister.    ROS:  Please see the history of present illness.   Otherwise, review of systems are positive for back pain.   All other systems are reviewed and negative.    PHYSICAL EXAM: VS:  BP 132/72   Pulse 60   Ht 5\' 2"  (1.575 m)   Wt 179 lb (81.2 kg)   SpO2 96%   BMI 32.74 kg/m  , BMI Body mass index is 32.74 kg/m. GEN: Well nourished, well developed, in no acute distress  HEENT: normal  Neck: no JVD, carotid bruits, or masses Cardiac: RRR; no murmurs, rubs, or gallops,no edema  Respiratory:  clear to auscultation bilaterally, normal work of breathing GI: soft, nontender, nondistended, + BS MS: no deformity or atrophy  Skin: warm and dry, no rash Neuro:  Strength and sensation are intact Psych: euthymic mood, full affect   EKG:   The ekg ordered today demonstrates NSR, ST-T wave  flattening   Recent Labs: No results found for requested labs within last 8760 hours.   Lipid Panel    Component Value Date/Time   CHOL 110 04/22/2015 0350   TRIG 143 04/22/2015 0350   HDL 27 (L) 04/22/2015 0350   CHOLHDL 4.1 04/22/2015 0350   VLDL 29 04/22/2015 0350   LDLCALC 54 04/22/2015 0350     Other studies Reviewed: Additional studies/ records that were reviewed today with results demonstrating: 2016 cath report reviewed.   ASSESSMENT AND PLAN:  1. CAD:  Mild by cath in 2016.  She continues to have atypical chest pain.  Continue preventive therapy.  No further work-up at this time.  2. HTN: The current medical regimen is effective;  continue present plan and medications. 3. Chronic diastolic heart failure: She appears euvolemic.  Continue current medications. 4. Type 2 DM: Continue current medications.  Managed by her primary care doctor.  A1c result is not available to me at this time. 5. Hyperlipidemia: Continue atorvastatin.  Will obtain most recent lab results from primary care doctor.   Current medicines are reviewed at length with the patient today.  The patient concerns regarding her medicines were addressed.  The following changes have been made:  No change  Labs/ tests ordered today include:  No orders of the defined types were placed in this encounter.   Recommend 150 minutes/week of aerobic exercise Low fat, low carb, high fiber diet recommended  Disposition:   FU as needed or 1 year   Signed, Lance Muss, MD  01/03/2018 4:04 PM    [facility] [address] [place], [place], [place]  [address] Phone: [phone]; Fax: [phone]

## 2018-06-19 IMAGING — NM NM MISC PROCEDURE
3 series · 18 of 18 positions shown · non-contrast
Comparison: none

[Series 1: wbr_s-proj_st stress_(id)_sa · 6.5mm · 6.51mm/px · 6 of 64 frames shown (1 of 2)]
[frame 6/64]
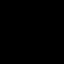
[frame 16/64]
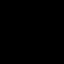
[frame 27/64]
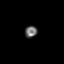
[frame 38/64]
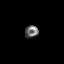
[frame 48/64]
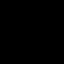
[frame 59/64]
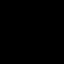

[Series 1: wbr_s-proj_st stress_(id)_sa · 6.5mm · 6.51mm/px · 6 of 512 frames shown (2 of 2)]
[frame 43/512]
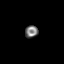
[frame 128/512]
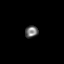
[frame 214/512]
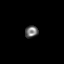
[frame 299/512]
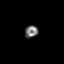
[frame 384/512]
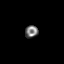
[frame 470/512]
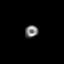

[Series 1: wbr_r-proj_st rest_(id)_sa · 6.5mm · 6.51mm/px · 6 of 64 frames shown]
[frame 6/64]
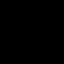
[frame 16/64]
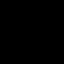
[frame 27/64]
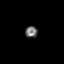
[frame 38/64]
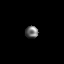
[frame 48/64]
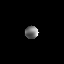
[frame 59/64]
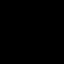

[18 of 18 positions shown; findings below may reference images not displayed]

Canned report from images found in remote index.

Refer to host system for actual result text.

## 2021-06-06 ENCOUNTER — Encounter (HOSPITAL_COMMUNITY): Payer: Self-pay

## 2021-06-06 ENCOUNTER — Emergency Department (HOSPITAL_COMMUNITY)
Admission: EM | Admit: 2021-06-06 | Discharge: 2021-06-06 | Disposition: A | Discharge status: Home / Self Care | Payer: Medicare Other | Attending: Emergency Medicine | Admitting: Emergency Medicine

## 2021-06-06 ENCOUNTER — Emergency Department (HOSPITAL_COMMUNITY): Payer: Medicare Other

## 2021-06-06 ENCOUNTER — Other Ambulatory Visit: Payer: Self-pay

## 2021-06-06 DIAGNOSIS — I2510 Atherosclerotic heart disease of native coronary artery without angina pectoris: Secondary | ICD-10-CM | POA: Insufficient documentation

## 2021-06-06 DIAGNOSIS — Z79899 Other long term (current) drug therapy: Secondary | ICD-10-CM | POA: Insufficient documentation

## 2021-06-06 DIAGNOSIS — R0600 Dyspnea, unspecified: Secondary | ICD-10-CM | POA: Insufficient documentation

## 2021-06-06 DIAGNOSIS — I5032 Chronic diastolic (congestive) heart failure: Secondary | ICD-10-CM | POA: Diagnosis not present

## 2021-06-06 DIAGNOSIS — N183 Chronic kidney disease, stage 3 unspecified: Secondary | ICD-10-CM | POA: Insufficient documentation

## 2021-06-06 DIAGNOSIS — D631 Anemia in chronic kidney disease: Secondary | ICD-10-CM | POA: Insufficient documentation

## 2021-06-06 DIAGNOSIS — E1122 Type 2 diabetes mellitus with diabetic chronic kidney disease: Secondary | ICD-10-CM | POA: Diagnosis not present

## 2021-06-06 DIAGNOSIS — Z96611 Presence of right artificial shoulder joint: Secondary | ICD-10-CM | POA: Insufficient documentation

## 2021-06-06 DIAGNOSIS — Z87891 Personal history of nicotine dependence: Secondary | ICD-10-CM | POA: Diagnosis not present

## 2021-06-06 DIAGNOSIS — I130 Hypertensive heart and chronic kidney disease with heart failure and stage 1 through stage 4 chronic kidney disease, or unspecified chronic kidney disease: Secondary | ICD-10-CM | POA: Diagnosis not present

## 2021-06-06 DIAGNOSIS — Z955 Presence of coronary angioplasty implant and graft: Secondary | ICD-10-CM | POA: Insufficient documentation

## 2021-06-06 DIAGNOSIS — Z7982 Long term (current) use of aspirin: Secondary | ICD-10-CM | POA: Diagnosis not present

## 2021-06-06 DIAGNOSIS — R059 Cough, unspecified: Secondary | ICD-10-CM | POA: Insufficient documentation

## 2021-06-06 DIAGNOSIS — Z91040 Latex allergy status: Secondary | ICD-10-CM | POA: Diagnosis not present

## 2021-06-06 DIAGNOSIS — Z8616 Personal history of COVID-19: Secondary | ICD-10-CM | POA: Insufficient documentation

## 2021-06-06 DIAGNOSIS — R0789 Other chest pain: Secondary | ICD-10-CM | POA: Diagnosis not present

## 2021-06-06 LAB — TROPONIN I (HIGH SENSITIVITY): Troponin I (High Sensitivity): 3 ng/L (ref <18)

## 2021-06-06 LAB — CBC
HCT: 33.2 % — ABNORMAL LOW (ref 36.0–46.0)
Hemoglobin: 10.8 g/dL — ABNORMAL LOW (ref 12.0–15.0)
MCH: 29.9 pg (ref 26.0–34.0)
MCHC: 32.5 g/dL (ref 30.0–36.0)
MCV: 92 fL (ref 80.0–100.0)
Platelets: 214 10*3/uL (ref 150–400)
RBC: 3.61 MIL/uL — ABNORMAL LOW (ref 3.87–5.11)
RDW: 13.4 % (ref 11.5–15.5)
WBC: 5.8 10*3/uL (ref 4.0–10.5)
nRBC: 0 % (ref 0.0–0.2)

## 2021-06-06 LAB — BASIC METABOLIC PANEL
Anion gap: 7 (ref 5–15)
BUN: 15 mg/dL (ref 8–23)
CO2: 26 mmol/L (ref 22–32)
Calcium: 9.3 mg/dL (ref 8.9–10.3)
Chloride: 104 mmol/L (ref 98–111)
Creatinine, Ser: 0.84 mg/dL (ref 0.44–1.00)
GFR, Estimated: >60 mL/min (ref >60)
Glucose, Bld: 90 mg/dL (ref 70–99)
Potassium: 3.7 mmol/L (ref 3.5–5.1)
Sodium: 137 mmol/L (ref 135–145)

## 2021-06-06 LAB — BRAIN NATRIURETIC PEPTIDE: B Natriuretic Peptide: 44.8 pg/mL (ref 0.0–100.0)

## 2021-06-06 NOTE — Discharge Instructions (Signed)
The testing today did not show any serious problems to be concerned about at this time.  Use the incentive spirometer as needed to help your discomfort.  Follow-up with your primary care doctor for further care and treatment as needed

## 2021-06-06 NOTE — ED Provider Notes (Signed)
[facility]-EMERGENCY DEPT Provider Note   CSN: [id number] Arrival date & time: 06/06/21  1615     History Chief Complaint  Patient presents with   Chest Pain   Cough   Shortness of Breath    [name] is a 75 y.o. female.  HPI She presents for evaluation of cough, chest discomfort and shortness of breath.  Ongoing symptoms for several months since COVID infection September 2022.  Pain is worse with deep breathing.  She is using medications including nitroglycerin and aspirin for the discomfort with partial relief.  She feels better sleeping when she sits up.  She does not have heart failure on last cardiac echo in 2016.  She has persistent symptoms, and has talked to both her cardiologist and primary care doctor about the symptoms, this week.  She is wondering if an incentive spirometer will help her feel better.  She is eating well and ambulating normally.  She is taking her usual medications.  There are no other known active modifying factors.    Past Medical History:  Diagnosis Date   Arthritis    Atypical face pain    CAD (coronary artery disease)    Catheterization 2006, mild two-vessel nonobstructive disease   CKD (chronic kidney disease), stage III (HCC)    Complication of anesthesia    "sometimes takes more medication to get me to sleep"   Diabetes mellitus    Ejection fraction    EF 60%, echo, October, 2010,   Fluid overload    Foot pain, right    November, 2012   GERD (gastroesophageal reflux disease)    Headache(784.0)    Hyperlipidemia    Hypertension    Mitral regurgitation    Mild, echo, October, 2010   Overweight(278.02)    PAD (peripheral artery disease) (HCC)    Dr. Darrick Penna,  Bilateral superficial femoral artery occlusions with one vessel runoff via the peroneal artery   Shortness of breath    Sleep apnea    CPAP compliance ???   Stroke [facility])    denies   Urinary incontinence    UTI (lower urinary tract infection)      Patient Active Problem List   Diagnosis Date Noted   S/p reverse total shoulder arthroplasty 09/18/2015   Abnormal nuclear cardiac imaging test    Diabetes mellitus without complication (HCC) 04/22/2015   Depression 04/22/2015   Chest pain 04/21/2015   Chronic diastolic CHF (congestive heart failure) (HCC) 04/21/2015   CKD (chronic kidney disease), stage III (HCC)    Pain in the chest    Essential hypertension, benign 12/11/2014   Spinal stenosis of lumbar region 03/20/2013   Acute on chronic kidney failure (HCC) 03/20/2013   Diastolic CHF (HCC) 03/20/2013   Gout 03/20/2013   Anemia of chronic disease 03/20/2013   Trochanteric bursitis 03/19/2013   Lumbar spondylosis with myelopathy 02/27/2013   Giant cell arteritis (HCC) 12/23/2011   Foot pain, right    CAD (coronary artery disease)    GERD (gastroesophageal reflux disease)    Sleep apnea    PAD (peripheral artery disease) (HCC)    Ejection fraction    Mitral regurgitation    FLUID OVERLOAD 06/04/2009   HYPOKALEMIA 06/04/2009   SHORTNESS OF BREATH 05/07/2009   DM 05/06/2009   HLD (hyperlipidemia) 05/06/2009   Overweight 05/06/2009   Essential hypertension 05/06/2009    Past Surgical History:  Procedure Laterality Date   ABDOMINAL HYSTERECTOMY     ARTERY BIOPSY  12/27/2011  Procedure: BIOPSY TEMPORAL ARTERY;  Surgeon: Sherren Kerns, MD;  Location: [facility] OR;  Service: Vascular;  Laterality: Left;   CARDIAC CATHETERIZATION N/A 04/23/2015   Procedure: Left Heart Cath and Coronary Angiography;  Surgeon: Corky Crafts, MD;  Location: [facility] INVASIVE CV LAB;  Service: Cardiovascular;  Laterality: N/A;   EYE SURGERY Bilateral 12/11/10   Cataract   REVERSE SHOULDER ARTHROPLASTY Right 09/18/2015   Procedure: REVERSE SHOULDER ARTHROPLASTY;  Surgeon: Jones Broom, MD;  Location: [facility] OR;  Service: Orthopedics;  Laterality: Right;  Right reverse total shoulder arthroplasty   REVERSE TOTAL SHOULDER ARTHROPLASTY Right 09/18/2015    SHOULDER ARTHROSCOPY Right 02/2015   VESICOVAGINAL FISTULA CLOSURE W/ TAH       OB History   No obstetric history on file.     Family History  Problem Relation Age of Onset   Diabetes Mother    Hyperlipidemia Mother    Hypertension Mother    Heart disease Father    Diabetes Sister    Hypertension Sister    Hyperlipidemia Sister    Anesthesia problems Neg Hx     Social History   Tobacco Use   Smoking status: Former    Years: 45.00    Types: Cigarettes    Quit date: 02/16/2003    Years since quitting: 18.3   Smokeless tobacco: Never   Tobacco comments:    Quit 2008  Substance Use Topics   Alcohol use: Yes    Comment: Social Drinker only/////////STOP DRINKING IN 2003   Drug use: No    Home Medications Prior to Admission medications   Medication Sig Start Date End Date Taking? Authorizing Provider  acetaminophen-codeine (TYLENOL #3) 300-30 MG tablet Take 1 tablet by mouth as needed for pain. 10/11/17   [provider]  allopurinol (ZYLOPRIM) 300 MG tablet Take 300 mg by mouth daily.  11/20/13   [provider]  aspirin EC 81 MG tablet Take 1 tablet (81 mg total) by mouth daily. 02/27/14   Luis Abed, MD  atorvastatin (LIPITOR) 40 MG tablet Take 1 tablet (40 mg total) by mouth daily. *PATIENT NEEDS AN APPOINTMENT FOR FURTHER REFILLS TO BE GRANTED OR OBTAIN FROM PCP* 08/30/16   Rosalio Macadamia, NP  buPROPion (WELLBUTRIN XL) 150 MG 24 hr tablet Take 150 mg by mouth daily. 11/17/16   [provider]  Cholecalciferol (VITAMIN D3) 5000 units TABS Take 5,000 Units by mouth daily.    [provider]  citalopram (CELEXA) 20 MG tablet Take 20 mg by mouth daily.     [provider]  metoprolol (LOPRESSOR) 100 MG tablet Take 100 mg by mouth 2 (two) times daily.  01/09/14   [provider]  NITROSTAT 0.4 MG SL tablet DISSOLVE ONE TABLET UNDER TONGUE AS NEEDED FOR CHEST PAIN EVERY 5 MINUTES 04/27/14   Luis Abed, MD  oxybutynin  (DITROPAN) 5 MG tablet Take 10 mg by mouth daily. 10/11/17   [provider]  oxyCODONE-acetaminophen (ROXICET) 5-325 MG tablet Take 1-2 tablets by mouth every 4 (four) hours as needed for severe pain. 09/18/15   Jiles Harold, PA-C  PROAIR HFA 108 (90 Base) MCG/ACT inhaler Inhale 2 puffs into the lungs 4 (four) times daily. 11/21/17   [provider]  spironolactone (ALDACTONE) 25 MG tablet Take 25 mg by mouth daily. 12/09/17   [provider]  traMADol (ULTRAM) 50 MG tablet Take 50 mg by mouth every 6 (six) hours as needed for moderate pain.  07/03/15  [provider]  traZODone (DESYREL) 100 MG tablet Take 100 mg by mouth at bedtime as needed for sleep.  09/10/14   [provider]    Allergies    Latex and Amoxicillin  Review of Systems   Review of Systems  All other systems reviewed and are negative.  Physical Exam Updated Vital Signs BP (!) 150/104   Pulse 82   Temp 98.4 F (36.9 C)   Resp 20   Ht 5\' 2"  (1.575 m)   Wt 62.6 kg   SpO2 100%   BMI 25.24 kg/m   Physical Exam Vitals and nursing note reviewed.  Constitutional:      Appearance: She is well-developed.  HENT:     Head: Normocephalic and atraumatic.  Eyes:     Conjunctiva/sclera: Conjunctivae normal.     Pupils: Pupils are equal, round, and reactive to light.  Neck:     Trachea: Phonation normal.  Cardiovascular:     Rate and Rhythm: Normal rate and regular rhythm.     Heart sounds: Normal heart sounds.  Pulmonary:     Effort: Pulmonary effort is normal.     Breath sounds: Normal breath sounds.  Chest:     Chest wall: No tenderness.  Abdominal:     General: There is no distension.     Palpations: Abdomen is soft.     Tenderness: There is no abdominal tenderness. There is no guarding.  Musculoskeletal:        General: Normal range of motion.     Cervical back: Normal range of motion and neck supple.  Skin:    General: Skin is warm and dry.  Neurological:      Mental Status: She is alert and oriented to person, place, and time.     Motor: No abnormal muscle tone.  Psychiatric:        Mood and Affect: Mood normal.        Behavior: Behavior normal.        Thought Content: Thought content normal.        Judgment: Judgment normal.    ED Results / Procedures / Treatments   Labs (all labs ordered are listed, but only abnormal results are displayed) Labs Reviewed  CBC - Abnormal; Notable for the following components:      Result Value   RBC 3.61 (*)    Hemoglobin 10.8 (*)    HCT 33.2 (*)    All other components within normal limits  BASIC METABOLIC PANEL  BRAIN NATRIURETIC PEPTIDE  TROPONIN I (HIGH SENSITIVITY)  TROPONIN I (HIGH SENSITIVITY)    EKG EKG Interpretation  Date/Time:  Saturday June 06 2021 16:39:15 EST Ventricular Rate:  61 PR Interval:    QRS Duration: 60 QT Interval:  428 QTC Calculation: 430 R Axis:   -16 Text Interpretation: Sinus rhythm Junctional ST depression, probably normal Borderline ECG Artifact since last tracing no significant change Confirmed by 01-06-1993 [phone]) on 06/06/2021 8:07:28 PM  Radiology DG Chest 2 View  Result Date: 06/06/2021 CLINICAL DATA:  Shortness of breath. EXAM: CHEST - 2 VIEW COMPARISON:  Chest x-ray 09/09/2015. FINDINGS: Aorta is tortuous with atherosclerotic calcifications. Cardiomediastinal silhouette is within normal limits. There is no focal lung infiltrate, pleural effusion or pneumothorax. No acute fractures are seen. Right shoulder arthroplasty is present. There surgical clips in the upper abdomen. IMPRESSION: No active cardiopulmonary disease. Electronically Signed   By: 09/11/2015 M.D.   On: 06/06/2021 17:31    Procedures Procedures  Medications Ordered in ED Medications - No data to display  ED Course  I have reviewed the triage vital signs and the nursing notes.  Pertinent labs & imaging results that were available during my care of the patient were  reviewed by me and considered in my medical decision making (see chart for details).    MDM Rules/Calculators/A&P                            Patient Vitals for the past 24 hrs:  BP Temp Pulse Resp SpO2 Height Weight  06/06/21 2015 (!) 150/104 -- 82 20 100 % -- --  06/06/21 2000 (!) 142/67 -- 64 16 98 % -- --  06/06/21 1930 (!) 155/68 -- 61 16 100 % -- --  06/06/21 1904 (!) 148/66 -- (!) 58 16 98 % -- --  06/06/21 1704 -- -- -- -- -- 5\' 2"  (1.575 m) 62.6 kg  06/06/21 1640 135/72 98.4 F (36.9 C) (!) 58 20 98 % 5\' 2"  (1.575 m) 62.6 kg    At the time of discharge- reevaluation with update and discussion. After initial assessment and treatment, an updated evaluation reveals she is comfortable has no further complaints.  She was given incentive spirometry to use.13/12/22   Medical Decision Making:  This patient is presenting for evaluation of shortness of breath, which does require a range of treatment options, and is a complaint that involves a moderate risk of morbidity and mortality. The differential diagnoses include pneumonia, PE, complications from COVID infection, heart failure. I decided to review old records, and in summary elderly female presenting with ongoing symptoms for 2 months, previously evaluated by both PCP and cardiology this week.  She has a history of coronary artery disease, GERD, peripheral arterial disease, mitral regurgitation and spinal stenosis of the lumbar spine I did not require additional historical information from anyone.  Clinical Laboratory Tests Ordered, included CBC, Metabolic panel, and troponin . Review indicates normal except hemoglobin low. Radiologic Tests Ordered, included chest x-ray.  I independently Visualized: Radiographic images, which show no infiltrate or edema  Cardiac Monitor Tracing which shows normal sinus rhythm   Critical Interventions-clinical evaluation, laboratory testing, radiography, observation, incentive spirometry,  reassessment  After These Interventions, the Patient was reevaluated and was found nonspecific shortness of breath with reassuring evaluation.  Doubt ACS, PE, pneumonia.  Shortness of breath, has occurred after COVID infection.  No overt evidence for acute cardiopulmonary compromise.  No indication for hospitalization or further ED evaluation at this time.  CRITICAL CARE-no Performed by:  Nursing Notes Reviewed/ Care Coordinated Applicable Imaging Reviewed Interpretation of Laboratory Data incorporated into ED treatment  The patient appears reasonably screened and/or stabilized for discharge and I doubt any other medical condition or other [facility] requiring further screening, evaluation, or treatment in the ED at this time prior to discharge.  Plan: Home Medications-continue usual; Home Treatments-rest, fluids; return here if the recommended treatment, does not improve the symptoms; Recommended follow up-PCP,.     Final Clinical Impression(s) / ED Diagnoses Final diagnoses:  Dyspnea, unspecified type    Rx / DC Orders ED Discharge Orders     None        Mancel Bale, MD 06/06/21 2330

## 2021-06-06 NOTE — ED Notes (Signed)
Patient provided with an incentive spirometer as requested, per MD.

## 2021-06-06 NOTE — ED Provider Notes (Signed)
Emergency Medicine Provider Triage Evaluation Note  KIMBERLLY NORGARD , a 75 y.o. female  was evaluated in triage.  Pt complains of cough, chest pain, shortness of breath. She states that her cough and shortness of breath has been present since she had COVID in September. Additionally, she endorses some worsening pleuritic chest pain over the past week which radiates into her left arm. She has been taking her home nitroglycerin and aspirin with some relief. She states that she has not noticed any worsening leg swelling, however has had to sleep elevated over the past few weeks with additional PND.   Review of Systems  Positive: Cough, chest pain, shortness of breath Negative: Fever, chills, leg swelling  Physical Exam  BP 135/72 (BP Location: Left Arm)   Pulse (!) 58   Temp 98.4 F (36.9 C)   Resp 20   Ht 5\' 2"  (1.575 m)   Wt 62.6 kg   SpO2 98%   BMI 25.24 kg/m  Gen:   Awake, no distress   Resp:  Normal effort  MSK:   Moves extremities without difficulty  Other:    Medical Decision Making  Medically screening exam initiated at 5:30 PM.  Appropriate orders placed.  NAHOMI HEGNER was informed that the remainder of the evaluation will be completed by another provider, this initial triage assessment does not replace that evaluation, and the importance of remaining in the ED until their evaluation is complete.     Reilynn, Lauro 06/06/21 1734    [place]egeler, 13/12/22, MD 06/07/21 [id number]

## 2021-06-06 NOTE — ED Triage Notes (Signed)
Pt c/o intermittent cp, cough, and sob over the past week

## 2022-12-07 ENCOUNTER — Emergency Department (HOSPITAL_COMMUNITY): Payer: 59

## 2022-12-07 ENCOUNTER — Other Ambulatory Visit: Payer: Self-pay

## 2022-12-07 ENCOUNTER — Emergency Department (HOSPITAL_COMMUNITY)
Admission: EM | Admit: 2022-12-07 | Discharge: 2022-12-08 | Disposition: A | Discharge status: Home / Self Care | Payer: 59 | Attending: Emergency Medicine | Admitting: Emergency Medicine

## 2022-12-07 DIAGNOSIS — I2510 Atherosclerotic heart disease of native coronary artery without angina pectoris: Secondary | ICD-10-CM | POA: Diagnosis not present

## 2022-12-07 DIAGNOSIS — E119 Type 2 diabetes mellitus without complications: Secondary | ICD-10-CM | POA: Diagnosis not present

## 2022-12-07 DIAGNOSIS — Z7982 Long term (current) use of aspirin: Secondary | ICD-10-CM | POA: Insufficient documentation

## 2022-12-07 DIAGNOSIS — R1032 Left lower quadrant pain: Secondary | ICD-10-CM | POA: Insufficient documentation

## 2022-12-07 DIAGNOSIS — I10 Essential (primary) hypertension: Secondary | ICD-10-CM | POA: Diagnosis not present

## 2022-12-07 DIAGNOSIS — Z79899 Other long term (current) drug therapy: Secondary | ICD-10-CM | POA: Insufficient documentation

## 2022-12-07 DIAGNOSIS — Z91040 Latex allergy status: Secondary | ICD-10-CM | POA: Insufficient documentation

## 2022-12-07 LAB — URINALYSIS, ROUTINE W REFLEX MICROSCOPIC
Bacteria, UA: NONE SEEN
Bilirubin Urine: NEGATIVE
Glucose, UA: NEGATIVE mg/dL
Hgb urine dipstick: NEGATIVE
Ketones, ur: NEGATIVE mg/dL
Nitrite: NEGATIVE
Protein, ur: NEGATIVE mg/dL
Specific Gravity, Urine: 1.018 (ref 1.005–1.030)
pH: 5 (ref 5.0–8.0)

## 2022-12-07 LAB — CBC
HCT: 32 % — ABNORMAL LOW (ref 36.0–46.0)
Hemoglobin: 9.7 g/dL — ABNORMAL LOW (ref 12.0–15.0)
MCH: 28.8 pg (ref 26.0–34.0)
MCHC: 30.3 g/dL (ref 30.0–36.0)
MCV: 95 fL (ref 80.0–100.0)
Platelets: 220 10*3/uL (ref 150–400)
RBC: 3.37 MIL/uL — ABNORMAL LOW (ref 3.87–5.11)
RDW: 14.2 % (ref 11.5–15.5)
WBC: 4.7 10*3/uL (ref 4.0–10.5)
nRBC: 0 % (ref 0.0–0.2)

## 2022-12-07 LAB — COMPREHENSIVE METABOLIC PANEL
ALT: 12 U/L (ref 0–44)
AST: 19 U/L (ref 15–41)
Albumin: 3.4 g/dL — ABNORMAL LOW (ref 3.5–5.0)
Alkaline Phosphatase: 77 U/L (ref 38–126)
Anion gap: 7 (ref 5–15)
BUN: 15 mg/dL (ref 8–23)
CO2: 28 mmol/L (ref 22–32)
Calcium: 9.4 mg/dL (ref 8.9–10.3)
Chloride: 104 mmol/L (ref 98–111)
Creatinine, Ser: 1 mg/dL (ref 0.44–1.00)
GFR, Estimated: 58 mL/min — ABNORMAL LOW (ref >60)
Glucose, Bld: 119 mg/dL — ABNORMAL HIGH (ref 70–99)
Potassium: 4.1 mmol/L (ref 3.5–5.1)
Sodium: 139 mmol/L (ref 135–145)
Total Bilirubin: 0.7 mg/dL (ref 0.3–1.2)
Total Protein: 7.2 g/dL (ref 6.5–8.1)

## 2022-12-07 LAB — LIPASE, BLOOD: Lipase: 21 U/L (ref 11–51)

## 2022-12-07 MED ORDER — ONDANSETRON HCL 4 MG/2ML IJ SOLN
4.0000 mg | Freq: Once | INTRAMUSCULAR | Status: AC
  Administered 2022-12-07: 4 mg via INTRAVENOUS
  Filled 2022-12-07: qty 2

## 2022-12-07 MED ORDER — GADOBUTROL 1 MMOL/ML IV SOLN
6.0000 mL | Freq: Once | INTRAVENOUS | Status: AC | PRN
  Administered 2022-12-07: 6 mL via INTRAVENOUS

## 2022-12-07 MED ORDER — DIAZEPAM 5 MG/ML IJ SOLN
2.5000 mg | Freq: Once | INTRAMUSCULAR | Status: AC | PRN
  Administered 2022-12-07: 2.5 mg via INTRAVENOUS
  Filled 2022-12-07: qty 2

## 2022-12-07 MED ORDER — IOHEXOL 350 MG/ML SOLN
65.0000 mL | Freq: Once | INTRAVENOUS | Status: AC | PRN
  Administered 2022-12-07: 65 mL via INTRAVENOUS

## 2022-12-07 MED ORDER — MORPHINE SULFATE (PF) 4 MG/ML IV SOLN
4.0000 mg | Freq: Once | INTRAVENOUS | Status: AC
Start: 2022-12-07 — End: 2022-12-08
  Administered 2022-12-08: 4 mg via INTRAVENOUS
  Filled 2022-12-07: qty 1

## 2022-12-07 MED ORDER — MORPHINE SULFATE (PF) 4 MG/ML IV SOLN
4.0000 mg | Freq: Once | INTRAVENOUS | Status: AC
  Administered 2022-12-07: 4 mg via INTRAVENOUS
  Filled 2022-12-07: qty 1

## 2022-12-07 MED ORDER — SODIUM CHLORIDE 0.9 % IV BOLUS
500.0000 mL | Freq: Once | INTRAVENOUS | Status: AC
  Administered 2022-12-07: 500 mL via INTRAVENOUS

## 2022-12-07 NOTE — ED Triage Notes (Signed)
Pt c/o generalized abd pain for two months. States that she was on vacation in [place] in March when this originally happened and was diagnosed with gallstones and referred to another facility. Pt then was going to have a colonoscopy but unable to complete prep and left AMA to return home to [place]. Pt c/o ongoing pain since then. Denies N/V. Endorses diarrhea, but states that she takes Miralax daily for OIC. Pt unable to control pain with Norco prescribed from pain clinic.

## 2022-12-07 NOTE — ED Provider Notes (Signed)
[place] EMERGENCY DEPARTMENT AT [facility] Provider Note   CSN: [id number] Arrival date & time: 12/07/22  1255     History  Chief Complaint  Patient presents with   Abdominal Pain    [name] is a 77 y.o. female.  Pt is a 77 yo female with pmhx significant for htn, cad, gerd, hld, dm, pad, and sleep apnea.  Pt has abd pain in the LLQ.  Pt said she's had intermittent abd pain for the past few months.  When she was in [place], she was admitted to the hospital for choledocholithiasis.  She had to leave prior to MRCP because she could not change her flight.  She has not followed up since then.  No n/v.  No f/c.       Home Medications Prior to Admission medications   Medication Sig Start Date End Date Taking? Authorizing Provider  polyethylene glycol (MIRALAX) 17 g packet Take 17 g by mouth daily. 12/08/22  Yes Kommor, [place], MD  acetaminophen-codeine (TYLENOL #3) 300-30 MG tablet Take 1 tablet by mouth as needed for pain. 10/11/17   [provider]  allopurinol (ZYLOPRIM) 300 MG tablet Take 300 mg by mouth daily.  11/20/13   [provider]  aspirin EC 81 MG tablet Take 1 tablet (81 mg total) by mouth daily. 02/27/14   Luis Abed, MD  atorvastatin (LIPITOR) 40 MG tablet Take 1 tablet (40 mg total) by mouth daily. *PATIENT NEEDS AN APPOINTMENT FOR FURTHER REFILLS TO BE GRANTED OR OBTAIN FROM PCP* 08/30/16   Rosalio Macadamia, NP  buPROPion (WELLBUTRIN XL) 150 MG 24 hr tablet Take 150 mg by mouth daily. 11/17/16   [provider]  Cholecalciferol (VITAMIN D3) 5000 units TABS Take 5,000 Units by mouth daily.    [provider]  citalopram (CELEXA) 20 MG tablet Take 20 mg by mouth daily.     [provider]  metoprolol (LOPRESSOR) 100 MG tablet Take 100 mg by mouth 2 (two) times daily.  01/09/14   [provider]  NITROSTAT 0.4 MG SL tablet DISSOLVE ONE TABLET UNDER TONGUE AS NEEDED FOR CHEST PAIN EVERY 5 MINUTES 04/27/14    Luis Abed, MD  oxybutynin (DITROPAN) 5 MG tablet Take 10 mg by mouth daily. 10/11/17   [provider]  oxyCODONE-acetaminophen (ROXICET) 5-325 MG tablet Take 1-2 tablets by mouth every 4 (four) hours as needed for severe pain. 09/18/15   Jiles Harold, PA-C  PROAIR HFA 108 (90 Base) MCG/ACT inhaler Inhale 2 puffs into the lungs 4 (four) times daily. 11/21/17   [provider]  spironolactone (ALDACTONE) 25 MG tablet Take 25 mg by mouth daily. 12/09/17   [provider]  traMADol (ULTRAM) 50 MG tablet Take 50 mg by mouth every 6 (six) hours as needed for moderate pain.  07/03/15   [provider]  traZODone (DESYREL) 100 MG tablet Take 100 mg by mouth at bedtime as needed for sleep.  09/10/14   [provider]      Allergies    Latex and Amoxicillin    Review of Systems   Review of Systems  Gastrointestinal:  Positive for abdominal pain.  All other systems reviewed and are negative.   Physical Exam Updated Vital Signs BP 124/61 (BP Location: Right Arm)   Pulse (!) 54   Temp 98.7 F (37.1 C) (Oral)   Resp 11   SpO2 94%  Physical Exam Vitals and nursing note reviewed.  Constitutional:  Appearance: She is well-developed.  HENT:     Head: Normocephalic and atraumatic.     Mouth/Throat:     Mouth: Mucous membranes are moist.     Pharynx: Oropharynx is clear.  Eyes:     Extraocular Movements: Extraocular movements intact.     Pupils: Pupils are equal, round, and reactive to light.  Cardiovascular:     Rate and Rhythm: Normal rate and regular rhythm.     Heart sounds: Normal heart sounds.  Pulmonary:     Effort: Pulmonary effort is normal.     Breath sounds: Normal breath sounds.  Abdominal:     General: Abdomen is flat.     Palpations: Abdomen is soft.     Tenderness: There is abdominal tenderness in the left lower quadrant.  Skin:    General: Skin is warm.     Capillary Refill: Capillary refill takes less than 2  seconds.  Neurological:     General: No focal deficit present.     Mental Status: She is alert and oriented to person, place, and time.  Psychiatric:        Mood and Affect: Mood normal.        Behavior: Behavior normal.     ED Results / Procedures / Treatments   Labs (all labs ordered are listed, but only abnormal results are displayed) Labs Reviewed  COMPREHENSIVE METABOLIC PANEL - Abnormal; Notable for the following components:      Result Value   Glucose, Bld 119 (*)    Albumin 3.4 (*)    GFR, Estimated 58 (*)    All other components within normal limits  CBC - Abnormal; Notable for the following components:   RBC 3.37 (*)    Hemoglobin 9.7 (*)    HCT 32.0 (*)    All other components within normal limits  URINALYSIS, ROUTINE W REFLEX MICROSCOPIC - Abnormal; Notable for the following components:   Leukocytes,Ua TRACE (*)    All other components within normal limits  LIPASE, BLOOD    EKG None  Radiology No results found.  Procedures Procedures    Medications Ordered in ED Medications  morphine (PF) 4 MG/ML injection 4 mg (4 mg Intravenous Given 12/07/22 1521)  ondansetron (ZOFRAN) injection 4 mg (4 mg Intravenous Given 12/07/22 1521)  sodium chloride 0.9 % bolus 500 mL (0 mLs Intravenous Stopped 12/07/22 1710)  iohexol (OMNIPAQUE) 350 MG/ML injection 65 mL (65 mLs Intravenous Contrast Given 12/07/22 1653)  morphine (PF) 4 MG/ML injection 4 mg (4 mg Intravenous Given 12/07/22 1724)  diazepam (VALIUM) injection 2.5 mg (2.5 mg Intravenous Given 12/07/22 1929)  gadobutrol (GADAVIST) 1 MMOL/ML injection 6 mL (6 mLs Intravenous Contrast Given 12/07/22 2038)  morphine (PF) 4 MG/ML injection 4 mg (4 mg Intravenous Given 12/08/22 0024)    ED Course/ Medical Decision Making/ A&P                             Medical Decision Making Amount and/or Complexity of Data Reviewed Labs: ordered. Radiology: ordered.  Risk Prescription drug management.   This patient presents to  the ED for concern of abd pain, this involves an extensive number of treatment options, and is a complaint that carries with it a high risk of complications and morbidity.  The differential diagnosis includes choledocholithiasis, diverticulitis, constipation, uti   Co morbidities that complicate the patient evaluation  htn, cad, gerd, hld, dm, pad, and sleep apnea   Additional  history obtained:  Additional history obtained from epic chart review  Lab Tests:  I Ordered, and personally interpreted labs.  The pertinent results include:  ua nl, cmp nl, cbc with hgb 9.7 (chronic)   Imaging Studies ordered:  I ordered imaging studies including ct abd/pelvis  I independently visualized and interpreted imaging which showed  . No acute findings.  2. 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 Correlate with LFTs  and clinical symptomatology.  3. Coronary and aortic Atherosclerosis (ICD10-I70.0).   I agree with the radiologist interpretation   Cardiac Monitoring:  The patient was maintained on a cardiac monitor.  I personally viewed and interpreted the cardiac monitored which showed an underlying rhythm of: nsr   Medicines ordered and prescription drug management:  I ordered medication including morphine/zofran/ivfs  for sx  Reevaluation of the patient after these medicines showed that the patient improved I have reviewed the patients home medicines and have made adjustments as needed   Test Considered:  ct   Critical Interventions:  morphine   Problem List / ED Course:  Abd pain:  labs nl.  CT does show significant swelling of the ducts.  Pt d/w Dr. Leonides Schanz who recommends MRCP.  This is pending at shift change.  Pt signed out to Dr. Posey Rea.   Reevaluation:  After the interventions noted above, I reevaluated the patient and found that they  have :improved   Social Determinants of Health:  Lives at home   Dispostion:  Pending MRCP        Final Clinical Impression(s) / ED Diagnoses Final diagnoses:  Left lower quadrant abdominal pain    Rx / DC Orders ED Discharge Orders          Ordered    Ambulatory referral to Gastroenterology        12/08/22 0209    polyethylene glycol (MIRALAX) 17 g packet  Daily        12/08/22 0209              Jacalyn Lefevre, MD 12/10/22 [phone]

## 2022-12-08 MED ORDER — POLYETHYLENE GLYCOL 3350 17 G PO PACK: 17.0000 g | PACK | Freq: Every day | ORAL | 0 refills | Status: DC

## 2022-12-08 NOTE — ED Provider Notes (Signed)
  Physical Exam  BP 124/61 (BP Location: Right Arm)   Pulse (!) 54   Temp 98.7 F (37.1 C) (Oral)   Resp 11   SpO2 94%   Physical Exam Vitals and nursing note reviewed.  Constitutional:      General: She is not in acute distress.    Appearance: She is well-developed.  HENT:     Head: Normocephalic and atraumatic.  Eyes:     Conjunctiva/sclera: Conjunctivae normal.  Cardiovascular:     Rate and Rhythm: Normal rate and regular rhythm.     Heart sounds: No murmur heard. Pulmonary:     Effort: Pulmonary effort is normal. No respiratory distress.  Abdominal:     Tenderness: There is abdominal tenderness in the left lower quadrant.  Musculoskeletal:        General: No swelling.     Cervical back: Neck supple.  Skin:    General: Skin is warm and dry.     Capillary Refill: Capillary refill takes less than 2 seconds.  Neurological:     Mental Status: She is alert.  Psychiatric:        Mood and Affect: Mood normal.     Procedures  Procedures  ED Course / MDM    Medical Decision Making Amount and/or Complexity of Data Reviewed Labs: ordered. Radiology: ordered.  Risk Prescription drug management.   Patient received in handoff.  Postprandial left     lower quadrant abdominal pain.  CT with severe biliary ductal dilatation but this appears to be chronic and thus patient is pending an MRCP.  MRCP with a slight worsening of her chronic biliary dilatation but no evidence of obstructing mass or stone.  Laboratory evaluation is reassuring.  Patient has been having issues with constipation and given that her pain appears to be primarily left lower quadrant and postprandial, I do wonder if her pain is secondary to her current stool burden.  I had an extended shared decision-making discussion with the patient and her daughter offering hospital admission versus discharge home for bowel cleanout and reassessment.  Family would like to be discharged at this time with a bowel cleanout  at home and will follow-up outpatient with gastroenterology as the patient is due for colonoscopy.  Patient then discharged with return precautions of which she and her daughter voiced understanding.   Glendora Score, MD 12/08/22 [phone]

## 2022-12-09 ENCOUNTER — Encounter: Payer: Self-pay | Admitting: Internal Medicine

## 2023-01-05 ENCOUNTER — Other Ambulatory Visit: Payer: Self-pay | Admitting: Nurse Practitioner

## 2023-01-05 DIAGNOSIS — Z780 Asymptomatic menopausal state: Secondary | ICD-10-CM

## 2023-02-01 ENCOUNTER — Ambulatory Visit: Payer: 59 | Admitting: Internal Medicine

## 2023-03-04 ENCOUNTER — Ambulatory Visit: Payer: 59 | Attending: Internal Medicine | Admitting: Internal Medicine

## 2023-03-04 VITALS — BP 124/78 | HR 53 | Ht 62.0 in | Wt 146.8 lb

## 2023-03-04 DIAGNOSIS — Z136 Encounter for screening for cardiovascular disorders: Secondary | ICD-10-CM

## 2023-03-04 MED ORDER — ISOSORBIDE MONONITRATE ER 30 MG PO TB24: 30.0000 mg | ORAL_TABLET | Freq: Every day | ORAL | 3 refills | Status: DC

## 2023-03-04 NOTE — Progress Notes (Signed)
Cardiology Office Note:    Date:  03/04/2023   ID:  KEELEY SZYDLO, DOB [birth year], MRN [id number]  PCP:  [facility]   [facility] Providers Cardiologist:  None     Referring MD: [facility]   No chief complaint on file.  Follow-up  History of Present Illness:    BRADEE ZHAI is a 77 y.o. female with a hx of CAD nonobstructive 2016 , HTN, DM2, GERD, PAD, CVA, HFpEF, CKD3  referral from [facility] for pmhx. She had non cardiac CP when she saw Dr. Abe People in 2019.  Echo in 2016 showed normal EF, no significant valve disease. Nuclear stress showed small defects in the LAD territory.  Reversible? Nuclear stress in 02/2017 shows no inducible ischemia.   Mrs. Henehan  presents for a follow-up visit after not seeing a cardiologist for over a year. She reports experiencing chest pains, which she manages by taking nitroglycerin and aspirin. Mrs. Dryman states that the chest pain can occur at any time of day and is not necessarily related to physical activity.   There was concern for reflux in the past with frequent normal cardiac studies. She has undergone an endoscopy in the past. She is not on antiacid therapy   Past Medical History:  Diagnosis Date   Arthritis    Atypical face pain    CAD (coronary artery disease)    Catheterization 2006, mild two-vessel nonobstructive disease   CKD (chronic kidney disease), stage III (HCC)    Complication of anesthesia    "sometimes takes more medication to get me to sleep"   Diabetes mellitus    Ejection fraction    EF 60%, echo, October, 2010,   Fluid overload    Foot pain, right    November, 2012   GERD (gastroesophageal reflux disease)    Headache(784.0)    Hyperlipidemia    Hypertension    Mitral regurgitation    Mild, echo, October, 2010   Overweight(278.02)    PAD (peripheral artery disease) (HCC)    Dr. Darrick Penna,  Bilateral superficial femoral artery occlusions with one vessel runoff via the  peroneal artery   Shortness of breath    Sleep apnea    CPAP compliance ???   Stroke [facility])    denies   Urinary incontinence    UTI (lower urinary tract infection)     Past Surgical History:  Procedure Laterality Date   ABDOMINAL HYSTERECTOMY     ARTERY BIOPSY  12/27/2011   Procedure: BIOPSY TEMPORAL ARTERY;  Surgeon: Sherren Kerns, MD;  Location: [facility] OR;  Service: Vascular;  Laterality: Left;   CARDIAC CATHETERIZATION N/A 04/23/2015   Procedure: Left Heart Cath and Coronary Angiography;  Surgeon: Corky Crafts, MD;  Location: [facility] INVASIVE CV LAB;  Service: Cardiovascular;  Laterality: N/A;   EYE SURGERY Bilateral 12/11/10   Cataract   REVERSE SHOULDER ARTHROPLASTY Right 09/18/2015   Procedure: REVERSE SHOULDER ARTHROPLASTY;  Surgeon: Jones Broom, MD;  Location: [facility] OR;  Service: Orthopedics;  Laterality: Right;  Right reverse total shoulder arthroplasty   REVERSE TOTAL SHOULDER ARTHROPLASTY Right 09/18/2015   SHOULDER ARTHROSCOPY Right 02/2015   VESICOVAGINAL FISTULA CLOSURE W/ TAH      Current Medications: Current Outpatient Medications on File Prior to Visit  Medication Sig Dispense Refill   allopurinol (ZYLOPRIM) 300 MG tablet Take 300 mg by mouth daily.      aspirin EC 81 MG tablet Take 1 tablet (81 mg total) by mouth daily. 90  tablet 3   atorvastatin (LIPITOR) 40 MG tablet Take 1 tablet (40 mg total) by mouth daily. *PATIENT NEEDS AN APPOINTMENT FOR FURTHER REFILLS TO BE GRANTED OR OBTAIN FROM PCP* 30 tablet 1   buPROPion (WELLBUTRIN XL) 150 MG 24 hr tablet Take 150 mg by mouth daily.     Cholecalciferol (VITAMIN D3) 5000 units TABS Take 5,000 Units by mouth daily.     citalopram (CELEXA) 20 MG tablet Take 20 mg by mouth daily.      metoprolol (LOPRESSOR) 100 MG tablet Take 100 mg by mouth 2 (two) times daily.      NITROSTAT 0.4 MG SL tablet DISSOLVE ONE TABLET UNDER TONGUE AS NEEDED FOR CHEST PAIN EVERY 5 MINUTES 25 tablet 3   oxybutynin (DITROPAN) 5 MG tablet Take 10  mg by mouth daily.     oxyCODONE-acetaminophen (ROXICET) 5-325 MG tablet Take 1-2 tablets by mouth every 4 (four) hours as needed for severe pain. 60 tablet 0   polyethylene glycol (MIRALAX) 17 g packet Take 17 g by mouth daily. 14 each 0   PROAIR HFA 108 (90 Base) MCG/ACT inhaler Inhale 2 puffs into the lungs 4 (four) times daily.     traZODone (DESYREL) 100 MG tablet Take 100 mg by mouth at bedtime as needed for sleep.      acetaminophen-codeine (TYLENOL #3) 300-30 MG tablet Take 1 tablet by mouth as needed for pain. (Patient not taking: Reported on 03/04/2023)     spironolactone (ALDACTONE) 25 MG tablet Take 25 mg by mouth daily. (Patient not taking: Reported on 03/04/2023)     traMADol (ULTRAM) 50 MG tablet Take 50 mg by mouth every 6 (six) hours as needed for moderate pain.  (Patient not taking: Reported on 03/04/2023)     No current facility-administered medications on file prior to visit.     Allergies:   Latex and Amoxicillin   Social History   Socioeconomic History   Marital status: Widowed    Spouse name: Not on file   Number of children: Not on file   Years of education: Not on file   Highest education level: Not on file  Occupational History   Not on file  Tobacco Use   Smoking status: Former    Current packs/day: 0.00    Types: Cigarettes    Start date: 02/15/1958    Quit date: 02/16/2003    Years since quitting: 20.0   Smokeless tobacco: Never   Tobacco comments:    Quit 2008  Substance and Sexual Activity   Alcohol use: Yes    Comment: Social Drinker only/////////STOP DRINKING IN 2003   Drug use: No   Sexual activity: Not on file  Other Topics Concern   Not on file  Social History Narrative   Not on file   Social Determinants of Health   Financial Resource Strain: Not on file  Food Insecurity: Not on file  Transportation Needs: Not on file  Physical Activity: Not on file  Stress: Not on file  Social Connections: Not on file     Family History: The  patient's family history includes Diabetes in her mother and sister; Heart disease in her father; Hyperlipidemia in her mother and sister; Hypertension in her mother and sister. There is no history of Anesthesia problems.  ROS:   Please see the history of present illness.     All other systems reviewed and are negative.  EKGs/Labs/Other Studies Reviewed:    The following studies were reviewed today: EKG Interpretation  Date/Time:  Friday March 04 2023 10:22:13 EDT Ventricular Rate:  53 PR Interval:  150 QRS Duration:  84 QT Interval:  452 QTC Calculation: 424 R Axis:   -37  Text Interpretation: Sinus bradycardia Left axis deviation Minimal voltage criteria for LVH, may be normal variant ( R in aVL ) When compared with ECG of 06-Jun-2021 16:39, Previous ECG has undetermined rhythm, needs review Questionable change in QRS duration Confirmed by Carolan Clines [phone]) on 03/04/2023 10:27:38 AM       Recent Labs: 12/07/2022: ALT 12; BUN 15; Creatinine, Ser 1.00; Hemoglobin 9.7; Platelets 220; Potassium 4.1; Sodium 139  Recent Lipid Panel    Component Value Date/Time   CHOL 110 04/22/2015 0350   TRIG 143 04/22/2015 0350   HDL 27 (L) 04/22/2015 0350   CHOLHDL 4.1 04/22/2015 0350   VLDL 29 04/22/2015 0350   LDLCALC 54 04/22/2015 0350     Risk Assessment/Calculations:    Physical Exam:    VS:  BP 124/78   Pulse (!) 53   Ht 5\' 2"  (1.575 m)   Wt 146 lb 12.8 oz (66.6 kg)   SpO2 96%   BMI 26.85 kg/m     Wt Readings from Last 3 Encounters:  03/04/23 146 lb 12.8 oz (66.6 kg)  06/06/21 138 lb (62.6 kg)  01/03/18 179 lb (81.2 kg)     GEN:  Well nourished, well developed in no acute distress HEENT: Normal NECK: No JVD CARDIAC: RRR, no murmurs, rubs, gallops RESPIRATORY:  Clear to auscultation without rales, wheezing or rhonchi  ABDOMEN: Soft, non-tender, non-distended MUSCULOSKELETAL:  No edema; No deformity  SKIN: Warm and dry NEUROLOGIC:  Alert and oriented x 3 PSYCHIATRIC:   Normal affect   ASSESSMENT and PLAN   Chest Pressure - notes it is at night, she was recommended to have a GI work up in the past for persistent CP and non obstructive coronary dx - she notes nitroglycerin helps, can be microvascular dx.   - continue BB -start imdur 30 mg daily  Reflux - PPI per PCP  Follow up 3 months           Medication Adjustments/Labs and Tests Ordered: Current medicines are reviewed at length with the patient today.  Concerns regarding medicines are outlined above.  Orders Placed This Encounter  Procedures   EKG 12-Lead   No orders of the defined types were placed in this encounter.   There are no Patient Instructions on file for this visit.   Signed, Maisie Fus, MD  03/04/2023 10:27 AM    [facility]

## 2023-03-04 NOTE — Patient Instructions (Signed)
Medication Instructions:  START Imdur(isosabride mononitrate) 30 mg daily  *If you need a refill on your cardiac medications before your next appointment, please call your pharmacy*     Follow-Up: At [facility], you and your health needs are our priority.  As part of our continuing mission to provide you with exceptional heart care, we have created designated Provider Care Teams.  These Care Teams include your primary Cardiologist (physician) and Advanced Practice Providers (APPs -  Physician Assistants and Nurse Practitioners) who all work together to provide you with the care you need, when you need it.      Your next appointment:   3 month(s)  Provider:   Maisie Fus, MD

## 2023-04-20 ENCOUNTER — Ambulatory Visit (INDEPENDENT_AMBULATORY_CARE_PROVIDER_SITE_OTHER): Payer: 59 | Admitting: Primary Care

## 2023-04-20 ENCOUNTER — Encounter: Payer: Self-pay | Admitting: Primary Care

## 2023-04-20 VITALS — BP 140/78 | HR 55 | Temp 97.6°F | Ht 62.0 in | Wt 142.2 lb

## 2023-04-20 DIAGNOSIS — I5032 Chronic diastolic (congestive) heart failure: Secondary | ICD-10-CM | POA: Diagnosis not present

## 2023-04-20 DIAGNOSIS — Z8669 Personal history of other diseases of the nervous system and sense organs: Secondary | ICD-10-CM

## 2023-04-20 NOTE — Patient Instructions (Signed)
Sleep apnea is defined as period of 10 seconds or longer when you stop breathing at night. This can happen multiple times a night. Dx sleep apnea is when this occurs more than 5 times an hour.    Mild OSA 5-15 apneic events an hour Moderate OSA 15-30 apneic events an hour Severe OSA > 30 apneic events an hour   Untreated sleep apnea puts you at higher risk for cardiac arrhythmias, pulmonary HTN, stroke and diabetes  Treatment options include weight loss, side sleeping position, oral appliance, CPAP therapy or referral to ENT for possible surgical options    Recommendations: Focus on side sleeping position or elevate head with wedge pillow 30 degrees Work on weight loss efforts if able  Do not drive if experiencing excessive daytime sleepiness of fatigue    Orders: Split night sleep study re: hx sleep apnea, snoring, heart failure    Follow-up: 2-3 months with Waynetta Sandy NP

## 2023-04-20 NOTE — Progress Notes (Signed)
@Patient  ID: [name], female    DOB: 1945-10-11, 77 y.o.   MRN: [id number]  Chief Complaint  Patient presents with   Consult    Sleep study 15 years ago  Epworth 3     Referring provider: Estevan Oaks, NP  HPI: 77 year old female, former smoker.  Past medical history significant for hypertension, coronary artery disease, congestive heart failure, sleep apnea, GERD, type 2 diabetes, chronic kidney disease, anemia.  04/20/2023 Presents today for sleep consult.  Hx sleep apnea. She had a sleep study >15 years ago, not available for review. She has not worn CPAP in 10 years due to issue with her tubing and getting replacement supplies. She has symptoms of snoring. Snoring will wake her up several times throughout the night. Associated daytime sleepiness. She does not get a full night sleep. She gets into bed around 6pm, she will dose off for short period and sleep will remain restless after this. She will wake up and get a cup of coffee around 8pm. She wakes up around 8am and stays in bed listening to music and reads her bible. She gets out of bed at 10am. Her granddaughter is staying with her.  She has lost weight, appetite is decreased.  No concern for narcolepsy, cataplexy or sleepwalking.  Epworth score 3.  Allergies  Allergen Reactions   Latex Itching   Amoxicillin Other (See Comments)    Yeast infection     There is no immunization history on file for this patient.  Past Medical History:  Diagnosis Date   Arthritis    Atypical face pain    CAD (coronary artery disease)    Catheterization 2006, mild two-vessel nonobstructive disease   CKD (chronic kidney disease), stage III (HCC)    Complication of anesthesia    "sometimes takes more medication to get me to sleep"   Diabetes mellitus    Ejection fraction    EF 60%, echo, October, 2010,   Fluid overload    Foot pain, right    November, 2012   GERD (gastroesophageal reflux disease)    Headache(784.0)     Hyperlipidemia    Hypertension    Mitral regurgitation    Mild, echo, October, 2010   Overweight(278.02)    PAD (peripheral artery disease) (HCC)    Dr. Darrick Penna,  Bilateral superficial femoral artery occlusions with one vessel runoff via the peroneal artery   Shortness of breath    Sleep apnea    CPAP compliance ???   Stroke [facility])    denies   Urinary incontinence    UTI (lower urinary tract infection)     Tobacco History: Social History   Tobacco Use  Smoking Status Former   Current packs/day: 0.00   Types: Cigarettes   Start date: 02/15/1958   Quit date: 02/16/2003   Years since quitting: 20.1  Smokeless Tobacco Never  Tobacco Comments   Quit 2008   Counseling given: Not Answered Tobacco comments: Quit 2008   Outpatient Medications Prior to Visit  Medication Sig Dispense Refill   allopurinol (ZYLOPRIM) 300 MG tablet Take 300 mg by mouth daily.      aspirin EC 81 MG tablet Take 1 tablet (81 mg total) by mouth daily. 90 tablet 3   atorvastatin (LIPITOR) 40 MG tablet Take 1 tablet (40 mg total) by mouth daily. *PATIENT NEEDS AN APPOINTMENT FOR FURTHER REFILLS TO BE GRANTED OR OBTAIN FROM PCP* 30 tablet 1   buPROPion (WELLBUTRIN XL) 150 MG 24 hr tablet  Take 150 mg by mouth daily.     Cholecalciferol (VITAMIN D3) 5000 units TABS Take 5,000 Units by mouth daily.     citalopram (CELEXA) 20 MG tablet Take 20 mg by mouth daily.      HYDROcodone-acetaminophen (NORCO) 10-325 MG tablet Take 1 tablet by mouth 4 (four) times daily as needed.     isosorbide mononitrate (IMDUR) 30 MG 24 hr tablet Take 1 tablet (30 mg total) by mouth daily. 90 tablet 3   metoprolol (LOPRESSOR) 100 MG tablet Take 100 mg by mouth 2 (two) times daily.      NITROSTAT 0.4 MG SL tablet DISSOLVE ONE TABLET UNDER TONGUE AS NEEDED FOR CHEST PAIN EVERY 5 MINUTES 25 tablet 3   oxybutynin (DITROPAN) 5 MG tablet Take 10 mg by mouth daily.     polyethylene glycol (MIRALAX) 17 g packet Take 17 g by mouth daily. 14 each  0   PROAIR HFA 108 (90 Base) MCG/ACT inhaler Inhale 2 puffs into the lungs 4 (four) times daily.     spironolactone (ALDACTONE) 25 MG tablet Take 25 mg by mouth daily.     traMADol (ULTRAM) 50 MG tablet Take 50 mg by mouth every 6 (six) hours as needed for moderate pain.     traZODone (DESYREL) 100 MG tablet Take 100 mg by mouth at bedtime as needed for sleep.      acetaminophen-codeine (TYLENOL #3) 300-30 MG tablet Take 1 tablet by mouth as needed for pain. (Patient not taking: Reported on 03/04/2023)     oxyCODONE-acetaminophen (ROXICET) 5-325 MG tablet Take 1-2 tablets by mouth every 4 (four) hours as needed for severe pain. (Patient not taking: Reported on 04/20/2023) 60 tablet 0   No facility-administered medications prior to visit.   Review of Systems  Review of Systems  Constitutional:  Positive for fatigue.  HENT: Negative.    Respiratory:  Positive for apnea. Negative for cough, shortness of breath and wheezing.   Psychiatric/Behavioral:  Positive for sleep disturbance.      Physical Exam  BP (!) 140/78 (BP Location: Left Arm, Patient Position: Sitting)   Pulse (!) 55   Temp 97.6 F (36.4 C) (Temporal)   Ht 5\' 2"  (1.575 m)   Wt 142 lb 3.2 oz (64.5 kg)   SpO2 100%   BMI 26.01 kg/m  Physical Exam Constitutional:      Appearance: Normal appearance.  HENT:     Head: Normocephalic and atraumatic.  Cardiovascular:     Rate and Rhythm: Normal rate and regular rhythm.  Pulmonary:     Effort: Pulmonary effort is normal.     Breath sounds: Normal breath sounds.  Neurological:     General: No focal deficit present.     Mental Status: She is alert. Mental status is at baseline.  Psychiatric:        Mood and Affect: Mood normal.        Behavior: Behavior normal.        Thought Content: Thought content normal.        Judgment: Judgment normal.      Lab Results:  CBC    Component Value Date/Time   WBC 4.7 12/07/2022 1317   RBC 3.37 (L) 12/07/2022 1317   HGB 9.7 (L)  12/07/2022 1317   HCT 32.0 (L) 12/07/2022 1317   PLT 220 12/07/2022 1317   MCV 95.0 12/07/2022 1317   MCH 28.8 12/07/2022 1317   MCHC 30.3 12/07/2022 1317   RDW 14.2 12/07/2022 1317  LYMPHSABS 1.2 09/09/2015 1413   MONOABS 0.3 09/09/2015 1413   EOSABS 0.1 09/09/2015 1413   BASOSABS 0.0 09/09/2015 1413    BMET    Component Value Date/Time   NA 139 12/07/2022 1317   NA 142 09/23/2014 0000   K 4.1 12/07/2022 1317   CL 104 12/07/2022 1317   CO2 28 12/07/2022 1317   GLUCOSE 119 (H) 12/07/2022 1317   BUN 15 12/07/2022 1317   BUN 19 09/23/2014 0000   CREATININE 1.00 12/07/2022 1317   CREATININE 1.18 (H) 05/05/2015 1412   CALCIUM 9.4 12/07/2022 1317   GFRNONAA 58 (L) 12/07/2022 1317   GFRAA 46 (L) 09/19/2015 0540    BNP    Component Value Date/Time   BNP 44.8 06/06/2021 1833    ProBNP No results found for: "PROBNP"  Imaging: No results found.   Assessment & Plan:   Sleep apnea - History of sleep apnea, originally diagnosed 15 years ago.  She has not worn CPAP therapy for the last 10 years due to supply issues.  Recent weight loss. She continues to have symptoms of snoring and waking up choking/gasping for air at night.  Associated daytime sleepiness.  Needs in-lab split-night sleep study to reassess OSA and optimal PAP pressure settings.  Encourage weight loss efforts and side sleeping position.  She is open to restarting CPAP if needed.  Follow-up in 6 to 8 weeks.  Glenford Bayley, NP 04/24/2023

## 2023-04-24 NOTE — Assessment & Plan Note (Addendum)
-   History of sleep apnea, originally diagnosed 15 years ago.  She has not worn CPAP therapy for the last 10 years due to supply issues.  Recent weight loss. She continues to have symptoms of snoring and waking up choking/gasping for air at night.  Associated daytime sleepiness.  Needs in-lab split-night sleep study to reassess OSA and optimal PAP pressure settings.  Encourage weight loss efforts and side sleeping position.  She is open to restarting CPAP if needed.  Follow-up in 6 to 8 weeks.

## 2023-06-14 ENCOUNTER — Ambulatory Visit: Payer: 59 | Admitting: Internal Medicine

## 2023-06-21 ENCOUNTER — Ambulatory Visit: Payer: 59 | Admitting: Primary Care

## 2023-06-29 ENCOUNTER — Ambulatory Visit (HOSPITAL_BASED_OUTPATIENT_CLINIC_OR_DEPARTMENT_OTHER): Payer: 59 | Admitting: Internal Medicine

## 2023-07-21 ENCOUNTER — Inpatient Hospital Stay: Admission: RE | Admit: 2023-07-21 | Payer: 59 | Source: Ambulatory Visit

## 2023-08-09 ENCOUNTER — Encounter (HOSPITAL_BASED_OUTPATIENT_CLINIC_OR_DEPARTMENT_OTHER): Payer: 59 | Admitting: Internal Medicine

## 2023-08-10 ENCOUNTER — Ambulatory Visit: Payer: 59 | Admitting: Internal Medicine

## 2023-08-18 ENCOUNTER — Ambulatory Visit: Payer: 59 | Attending: Internal Medicine | Admitting: Internal Medicine

## 2023-08-18 ENCOUNTER — Telehealth: Payer: Self-pay

## 2023-08-18 NOTE — Telephone Encounter (Signed)
Pt is scheduled to see Waynetta Sandy, NP tomorrow for a f/u on her split night study. Pt is not scheduled for the split night study until 08-21-23 at 8:00 pm. I called the pt and informed her we would need to reschedule her appointment with [facility], since it is to early to see her without the study being done. Pt stated she needs the split night study rescheduled due to having issues and she just recently had a "procedure" or something done and this would need to be rescheduled. Sending to the [facility] to call the pt and have the study rescheduled. I will cancel her appointment for tomorrow, and block it per Waynetta Sandy, NP. NFN.

## 2023-08-19 ENCOUNTER — Ambulatory Visit: Payer: 59 | Admitting: Primary Care

## 2023-08-21 ENCOUNTER — Ambulatory Visit (HOSPITAL_BASED_OUTPATIENT_CLINIC_OR_DEPARTMENT_OTHER): Payer: 59 | Admitting: Internal Medicine

## 2023-10-06 ENCOUNTER — Ambulatory Visit (HOSPITAL_BASED_OUTPATIENT_CLINIC_OR_DEPARTMENT_OTHER): Payer: 59 | Admitting: Internal Medicine

## 2023-11-15 LAB — LAB REPORT - SCANNED: EGFR (African American): 70

## 2023-12-05 ENCOUNTER — Encounter: Payer: Self-pay | Admitting: Cardiovascular Disease

## 2023-12-05 ENCOUNTER — Ambulatory Visit: Attending: Cardiovascular Disease | Admitting: Cardiovascular Disease

## 2023-12-05 VITALS — BP 220/100 | HR 55 | Ht 62.5 in | Wt 140.2 lb

## 2023-12-05 DIAGNOSIS — R0789 Other chest pain: Secondary | ICD-10-CM | POA: Diagnosis not present

## 2023-12-05 DIAGNOSIS — D649 Anemia, unspecified: Secondary | ICD-10-CM

## 2023-12-05 DIAGNOSIS — I10 Essential (primary) hypertension: Secondary | ICD-10-CM

## 2023-12-05 DIAGNOSIS — E7800 Pure hypercholesterolemia, unspecified: Secondary | ICD-10-CM | POA: Diagnosis not present

## 2023-12-05 MED ORDER — CARVEDILOL 6.25 MG PO TABS: 6.2500 mg | ORAL_TABLET | Freq: Two times a day (BID) | ORAL | 3 refills | Status: AC

## 2023-12-05 MED ORDER — LOSARTAN POTASSIUM 100 MG PO TABS: 100.0000 mg | ORAL_TABLET | Freq: Every day | ORAL | 3 refills | Status: DC

## 2023-12-05 NOTE — Progress Notes (Signed)
 Faxed a Request to [facility] for patient's most recent lipid panel. [phone]

## 2023-12-05 NOTE — Patient Instructions (Signed)
 Medication Instructions:  Increase Losartan to 100 mg daily Carvedilol 6.25 mg twice a day *If you need a refill on your cardiac medications before your next appointment, please call your pharmacy*  Follow-Up: At [facility], you and your health needs are our priority.  As part of our continuing mission to provide you with exceptional heart care, our providers are all part of one team.  This team includes your primary Cardiologist (physician) and Advanced Practice Providers or APPs (Physician Assistants and Nurse Practitioners) who all work together to provide you with the care you need, when you need it.  Your next appointment:   APP- 2-3 WEEKS for BP  Dr Alvis Ba 4-5 months  We recommend signing up for the patient portal called "MyChart".  Sign up information is provided on this After Visit Summary.  MyChart is used to connect with patients for Virtual Visits (Telemedicine).  Patients are able to view lab/test results, encounter notes, upcoming appointments, etc.  Non-urgent messages can be sent to your provider as well.   To learn more about what you can do with MyChart, go to [email or web address].

## 2023-12-05 NOTE — Progress Notes (Signed)
 Cardiology Office Note:    Date:  12/05/2023   ID:  [name], DOB 1946/04/26, MRN [id number]  PCP:  Carolyn Cisco, NP   [facility] Providers Cardiologist:  None     Referring MD: [facility], [facility]   Chief Complaint  Patient presents with   Chest Pain   Hypertension    History of Present Illness:    [name] is a 78 y.o. female with hypertension and anemia who presents with uncontrolled blood pressure and chest pain.  She was previously seen by Dr. Jacquelynn Matter and then Dr. Alois Arnt.  She recently experienced pain in her back and left chest, which did not resolve quickly with nitroglycerin , taking about 30 minutes to subside after lying down. She also took aspirin  to alleviate the headache caused by nitroglycerin  and I wonder whether this actually helped the chest pain.  No chest pain, dizziness, syncope, or shortness of breath with household activities.   She has a history of hypertension and reports that her blood pressure has been difficult to control. Recently, she had to receive two shots to lower her blood pressure, but it remained elevated despite sitting for five hours. At home, she uses a wrist blood pressure monitor and often sees high readings, indicated by a red color on the device. Her current medication list in our computer system  included losartan 50 mg, metoprolol  100 mg twice daily, and spironolactone 25 mg once daily, but the list that she showed me on the medicines that she is currently taking only has losartan as an antihypertensive medication.  Recent blood work done in January indicated anemia. Her hemoglobin was 9.7 a year ago, and she was informed that she is still anemic. She has been experiencing stomach issues, which she associates with her medication use, although none of her current cardiac medications are known to cause digestive problems.  In 2016 she had abnormalities on her nuclear perfusion study, subsequently followed up  with a cardiac catheterization did not show any evidence of coronary artery disease (minimal luminal irregularities).  In 2018 she had another nuclear perfusion study that showed normal pattern of perfusion.  She lives alone and manages her household tasks independently, including grocery shopping and cleaning, without experiencing shortness of breath or fatigue.  Past Medical History:  Diagnosis Date   Arthritis    Atypical face pain    CAD (coronary artery disease)    Catheterization 2006, mild two-vessel nonobstructive disease   CKD (chronic kidney disease), stage III (HCC)    Complication of anesthesia    "sometimes takes more medication to get me to sleep"   Diabetes mellitus    Ejection fraction    EF 60%, echo, October, 2010,   Fluid overload    Foot pain, right    November, 2012   GERD (gastroesophageal reflux disease)    Headache(784.0)    Hyperlipidemia    Hypertension    Mitral regurgitation    Mild, echo, October, 2010   Overweight(278.02)    PAD (peripheral artery disease) (HCC)    Dr. Nolene Baumgarten,  Bilateral superficial femoral artery occlusions with one vessel runoff via the peroneal artery   Shortness of breath    Sleep apnea    CPAP compliance ???   Stroke [facility])    denies   Urinary incontinence    UTI (lower urinary tract infection)     Past Surgical History:  Procedure Laterality Date   ABDOMINAL HYSTERECTOMY     ARTERY BIOPSY  12/27/2011   Procedure: BIOPSY TEMPORAL ARTERY;  Surgeon: Richrd Char, MD;  Location: [facility] OR;  Service: Vascular;  Laterality: Left;   CARDIAC CATHETERIZATION N/A 04/23/2015   Procedure: Left Heart Cath and Coronary Angiography;  Surgeon: Lucendia Rusk, MD;  Location: [facility] INVASIVE CV LAB;  Service: Cardiovascular;  Laterality: N/A;   EYE SURGERY Bilateral 12/11/10   Cataract   REVERSE SHOULDER ARTHROPLASTY Right 09/18/2015   Procedure: REVERSE SHOULDER ARTHROPLASTY;  Surgeon: Sammye Cristal, MD;  Location: [facility] OR;  Service:  Orthopedics;  Laterality: Right;  Right reverse total shoulder arthroplasty   REVERSE TOTAL SHOULDER ARTHROPLASTY Right 09/18/2015   SHOULDER ARTHROSCOPY Right 02/2015   VESICOVAGINAL FISTULA CLOSURE W/ TAH      Current Medications: Current Meds  Medication Sig   allopurinol  (ZYLOPRIM ) 100 MG tablet Take 100 mg by mouth 2 (two) times daily.   aspirin  EC 81 MG tablet Take 1 tablet (81 mg total) by mouth daily.   atorvastatin  (LIPITOR) 40 MG tablet Take 1 tablet (40 mg total) by mouth daily. *PATIENT NEEDS AN APPOINTMENT FOR FURTHER REFILLS TO BE GRANTED OR OBTAIN FROM PCP*   carvedilol (COREG) 6.25 MG tablet Take 1 tablet (6.25 mg total) by mouth 2 (two) times daily.   Cholecalciferol (VITAMIN D3) 5000 units TABS Take 5,000 Units by mouth daily.   citalopram  (CELEXA ) 20 MG tablet Take 20 mg by mouth daily.    HYDROcodone -acetaminophen  (NORCO) 10-325 MG tablet Take 1 tablet by mouth 4 (four) times daily as needed.   isosorbide  mononitrate (IMDUR ) 30 MG 24 hr tablet Take 1 tablet (30 mg total) by mouth daily.   NITROSTAT  0.4 MG SL tablet DISSOLVE ONE TABLET UNDER TONGUE AS NEEDED FOR CHEST PAIN EVERY 5 MINUTES   oxybutynin (DITROPAN XL) 15 MG 24 hr tablet Take 15 mg by mouth daily.   oxyCODONE -acetaminophen  (ROXICET) 5-325 MG tablet Take 1-2 tablets by mouth every 4 (four) hours as needed for severe pain.   traZODone  (DESYREL ) 100 MG tablet Take 100 mg by mouth at bedtime as needed for sleep.    [DISCONTINUED] losartan (COZAAR) 50 MG tablet Take 50 mg by mouth daily.     Allergies:   Latex and Amoxicillin  = Family History: The patient's family history includes Diabetes in her mother and sister; Heart disease in her father; Hyperlipidemia in her mother and sister; Hypertension in her mother and sister. There is no history of Anesthesia problems.  ROS:   Please see the history of present illness.     All other systems reviewed and are negative.  EKGs/Labs/Other Studies Reviewed:    The  following studies were reviewed today:  EKG Interpretation Date/Time:  Monday Dec 05 2023 15:35:07 EDT Ventricular Rate:  55 PR Interval:  150 QRS Duration:  86 QT Interval:  430 QTC Calculation: 411 R Axis:   -33  Text Interpretation: Sinus bradycardia with sinus arrhythmia Left axis deviation Minimal voltage criteria for LVH, may be normal variant ( R in aVL ) When compared with ECG of 04-Mar-2023 10:22, No significant change was found Confirmed by Roderica Cathell [id number]) on 12/05/2023 4:10:31 PM    Recent Labs: 12/07/2022: ALT 12; BUN 15; Creatinine, Ser 1.00; Hemoglobin 9.7; Platelets 220; Potassium 4.1; Sodium 139  Recent Lipid Panel    Component Value Date/Time   CHOL 110 04/22/2015 0350   TRIG 143 04/22/2015 0350   HDL 27 (L) 04/22/2015 0350   CHOLHDL 4.1 04/22/2015 0350   VLDL 29 04/22/2015 0350   LDLCALC  54 04/22/2015 0350     Risk Assessment/Calculations:      HYPERTENSION CONTROL Vitals:   12/05/23 1529 12/05/23 1616  BP: (!) 198/82 (!) 220/100    The patient's blood pressure is elevated above target today.  In order to address the patient's elevated BP: A current anti-hypertensive medication was adjusted today.; A new medication was prescribed today.            Physical Exam:    VS:  BP (!) 220/100   Pulse (!) 55   Ht 5' 2.5" (1.588 m)   Wt 63.6 kg   SpO2 96%   BMI 25.23 kg/m     Wt Readings from Last 3 Encounters:  12/05/23 63.6 kg  04/20/23 64.5 kg  03/04/23 66.6 kg     GEN: Appears lean, well nourished, well developed in no acute distress HEENT: Normal NECK: No JVD; No carotid bruits LYMPHATICS: No lymphadenopathy CARDIAC: RRR, no murmurs, rubs, gallops RESPIRATORY:  Clear to auscultation without rales, wheezing or rhonchi  ABDOMEN: Soft, non-tender, non-distended MUSCULOSKELETAL:  No edema; No deformity  SKIN: Warm and dry NEUROLOGIC:  Alert and oriented x 3 PSYCHIATRIC:  Normal affect   ASSESSMENT:    1. Other chest pain   2.  Essential hypertension   3. Hypercholesterolemia   4. Anemia, unspecified type    PLAN:    In order of problems listed above:  Chest pain: This occurred at rest that did not resolve quickly with sublingual nitroglycerin , but rather 30 minutes later.  Suspect was musculoskeletal may have improved because she took aspirin .  She had minimal luminal irregularities on cardiac catheterization performed in 2016 and had a normal perfusion pattern on stress test from 2018. HTN: Her blood pressure is very severely elevated today but she does not appear to be symptomatic.  It looks like she is stopped taking a couple of her previous antihypertensive medications (metoprolol  and spironolactone).  Will try to gradually bring her blood pressure back in normal range.  Increase losartan to 100 mg daily.  Take carvedilol 6.25 mg twice daily.  Have her come back for further medication adjustment in a couple of weeks.  There are some tangential references to hyperkalemia in her chart so will not restart the spironolactone. HLP: She is taking atorvastatin  but I do not have a recent lipid profile.  Would recommend to target LDL cholesterol less than 70 since she did have some luminal irregularities on the previous cardiac catheterization. Anemia: Mechanism is uncertain, but the hemoglobin appears to be stable in the 9-10 range going back at least to 2014. She is normocytic normochromic.  Other blood cell line counts are normal.           Medication Adjustments/Labs and Tests Ordered: Current medicines are reviewed at length with the patient today.  Concerns regarding medicines are outlined above.  Orders Placed This Encounter  Procedures   EKG 12-Lead   Meds ordered this encounter  Medications   losartan (COZAAR) 100 MG tablet    Sig: Take 1 tablet (100 mg total) by mouth daily.    Dispense:  90 tablet    Refill:  3   carvedilol (COREG) 6.25 MG tablet    Sig: Take 1 tablet (6.25 mg total) by mouth 2 (two)  times daily.    Dispense:  180 tablet    Refill:  3    Patient Instructions  Medication Instructions:  Increase Losartan to 100 mg daily Carvedilol 6.25 mg twice a day *If you  need a refill on your cardiac medications before your next appointment, please call your pharmacy*  Follow-Up: At [facility], you and your health needs are our priority.  As part of our continuing mission to provide you with exceptional heart care, our providers are all part of one team.  This team includes your primary Cardiologist (physician) and Advanced Practice Providers or APPs (Physician Assistants and Nurse Practitioners) who all work together to provide you with the care you need, when you need it.  Your next appointment:   APP- 2-3 WEEKS for BP  Dr Alvis Ba 4-5 months  We recommend signing up for the patient portal called "MyChart".  Sign up information is provided on this After Visit Summary.  MyChart is used to connect with patients for Virtual Visits (Telemedicine).  Patients are able to view lab/test results, encounter notes, upcoming appointments, etc.  Non-urgent messages can be sent to your provider as well.   To learn more about what you can do with MyChart, go to [email or web address].          Signed, Luana Rumple, MD  12/05/2023 6:07 PM     [facility]Care

## 2023-12-21 LAB — LAB REPORT - SCANNED
Albumin, Urine POC: 2.5
Calcium: 9.5
Creatinine, POC: 77 mg/dL
EGFR (African American): 59
PTH, Intact: 48
TSH: 0.87 (ref 0.41–5.90)

## 2023-12-22 NOTE — Progress Notes (Unsigned)
 OFFICE NOTE Date:  12/23/2023  ID:  [name], DOB 1945-07-29, MRN [id number] PCP: Carolyn Cisco, NP  [facility] Providers Cardiologist:  None     Patient Profile:     Coronary artery disease  LHC 04/23/2015: Nonobstructive CAD with minimal luminal irregularities Lexiscan  MPI 03/22/2017: EF 57, no ischemia or infarction (HFpEF) heart failure with preserved ejection fraction  TTE 04/22/2015: EF 60-65, no RWMA, GR 1 DD Peripheral arterial disease  Hx of bilat SFA occlusions (Dr. Nolene Baumgarten) Chronic kidney disease Hypertension  Hyperlipidemia  Giant cell arteritis  N/N Anemia OSA      Discussed the use of AI scribe software for clinical note transcription with the patient, who gave verbal consent to proceed. History of Present Illness [name] is a 78 y.o. female who returns for follow up of hypertension, chest pain. She is a prior pt of Dr. Ardell Koller, Dr. Jacquelynn Matter, then Dr. Nilda Barthel. She est w Dr. Alvis Ba 12/05/23 after an episode of chest pain that did not resolve with NTG. BP was markedly elevated. Chest pain was felt to be non-cardiac and likely MSK. She was off metoprolol  and spironolactone. Losartan  was increased to 100 mg once daily, started on Coreg  6.25 mg twice daily. There were some references to hyperK+ in her chart. Therefore, spironolactone was not restarted.   She is here alone.  She notes that her home blood pressure readings were 183/86 on May 28 and 198/89 on May 29. She experiences chronic chest pain on the left side that does not radiate and is not relieved by nitroglycerin . The chest pain has been present for years without change. She also reports shortness of breath, ongoing for years.  This has been gradually worsening, making it difficult to walk up hills or perform activities without becoming breathless. She uses pillows to prop herself up at night to breathe better. She experiences heart palpitations and has been placed on a heart monitor by her  primary care provider. She describes her heart as 'skipping' and 'racing' at times. She reports no recent changes in her diet or caffeine intake, although she drinks coffee and soda. She quit smoking and drinking in 2003.  ROS-See HPI    Studies Reviewed:      Results LABS-reviewed, obtained from primary care Potassium: 4.3 mEq/L (12/06/2023) Creatinine: 0.9 mg/dL (8/46/9629) ALT: 19 U/L (12/06/2023) Hemoglobin: 9.8 g/dL (12/21/4130)  Risk Assessment/Calculations:   HYPERTENSION CONTROL Vitals:   12/23/23 1057 12/23/23 1235  BP: (!) 203/87 (!) 210/90    The patient's blood pressure is elevated above target today.  In order to address the patient's elevated BP: A new medication was prescribed today.         Physical Exam:  VS:  BP (!) 210/90   Pulse (!) 50   Ht 5\' 2"  (1.575 m)   Wt 141 lb 3.2 oz (64 kg)   SpO2 96%   BMI 25.83 kg/m    Wt Readings from Last 3 Encounters:  12/23/23 141 lb 3.2 oz (64 kg)  12/05/23 140 lb 3.2 oz (63.6 kg)  04/20/23 142 lb 3.2 oz (64.5 kg)    Constitutional:      Appearance: Healthy appearance. Not in distress.  Neck:     Vascular: JVD normal.  Pulmonary:     Breath sounds: Normal breath sounds. No wheezing. No rales.  Cardiovascular:     Normal rate. Regular rhythm.     Murmurs: There is no murmur.  Edema:    Peripheral  edema absent.  Abdominal:     Palpations: Abdomen is soft.  Neurological:     General: No focal deficit present.        Assessment and Plan: Assessment & Plan Essential hypertension Blood pressure is markedly uncontrolled.  Unfortunately, she has not taken any medications yet today. There is confusion regarding her medication regimen.  She is currently taking carvedilol , isosorbide , losartan , and spironolactone.  She notes that she has been on spironolactone for several months.  Initially, she listed metoprolol  100 mg twice daily.  However, after further questioning, she is not on metoprolol .  - Continue  carvedilol  6.25 mg twice daily - Continue isosorbide  mononitrate 30 mg daily - Continue losartan  100 mg daily - Continue spironolactone 25 mg daily - Start amlodipine 5 mg daily - She had recent labs with primary care.  We could not verify with them what she had done.  I wanted to get a basic metabolic panel today to recheck her potassium and creatinine.  The patient declined further blood tests today. Coronary artery disease due to lipid rich plaque Minimal luminal irregularities by cardiac catheterization in 2016. Non-ischemic stress test in 2018.  She has had chronic chest pain for many years.  This is unchanged.  She takes nitroglycerin  without relief. Symptoms do not sound cardiac. No further testing needed at this time. - Continue atorvastatin  40 mg daily, ASA 81 mg daily Chronic heart failure with preserved ejection fraction (HCC) Mild diastolic dysfunction with EF 60-65% by echocardiogram in 2016. Volume status is stable.  She is NYHA IIb.  Currently on spironolactone 25 mg daily.  She does not need loop diuretic at this point. Will hold off on considering SGLT2 inhibitor for now. - Continue spironolactone 25 mg daily Palpitations She was recently placed on an event monitor (Zio) by primary care.  She is still wearing the device.        Dispo:  Return in about 6 weeks (around 02/03/2024) for Routine Follow Up, w/ Marlyse Single, PA-C. Signed, Marlyse Single, PA-C

## 2023-12-23 ENCOUNTER — Ambulatory Visit: Attending: Physician Assistant | Admitting: Physician Assistant

## 2023-12-23 ENCOUNTER — Encounter: Payer: Self-pay | Admitting: Physician Assistant

## 2023-12-23 VITALS — BP 210/90 | HR 50 | Ht 62.0 in | Wt 141.2 lb

## 2023-12-23 DIAGNOSIS — I10 Essential (primary) hypertension: Secondary | ICD-10-CM | POA: Diagnosis not present

## 2023-12-23 DIAGNOSIS — I5032 Chronic diastolic (congestive) heart failure: Secondary | ICD-10-CM | POA: Diagnosis not present

## 2023-12-23 DIAGNOSIS — I2510 Atherosclerotic heart disease of native coronary artery without angina pectoris: Secondary | ICD-10-CM

## 2023-12-23 DIAGNOSIS — I2583 Coronary atherosclerosis due to lipid rich plaque: Secondary | ICD-10-CM

## 2023-12-23 DIAGNOSIS — R002 Palpitations: Secondary | ICD-10-CM

## 2023-12-23 MED ORDER — AMLODIPINE BESYLATE 5 MG PO TABS: 5.0000 mg | ORAL_TABLET | Freq: Every day | ORAL | 11 refills | Status: DC

## 2023-12-23 NOTE — Patient Instructions (Addendum)
 Medication Instructions:  Start Amlodipine 5 mg once daily  (for blood pressure) Take all of your other medications as listed.  *If you need a refill on your cardiac medications before your next appointment, please call your pharmacy*  Lab Work: TODAY:  BMET   If you have labs (blood work) drawn today and your tests are completely normal, you will receive your results only by: MyChart Message (if you have MyChart) OR A paper copy in the mail If you have any lab test that is abnormal or we need to change your treatment, we will call you to review the results.  Follow-Up: At [facility], you and your health needs are our priority.  As part of our continuing mission to provide you with exceptional heart care, our providers are all part of one team.  This team includes your primary Cardiologist (physician) and Advanced Practice Providers or APPs (Physician Assistants and Nurse Practitioners) who all work together to provide you with the care you need, when you need it.  Your next appointment:   6 week(s)  Provider:   Marlyse Single, PA-C          We recommend signing up for the patient portal called "MyChart".  Sign up information is provided on this After Visit Summary.  MyChart is used to connect with patients for Virtual Visits (Telemedicine).  Patients are able to view lab/test results, encounter notes, upcoming appointments, etc.  Non-urgent messages can be sent to your provider as well.   To learn more about what you can do with MyChart, go to [email or web address].   Other Instructions Make sure you take all of your medications before you come to your next appointment.

## 2023-12-23 NOTE — Assessment & Plan Note (Signed)
 Minimal luminal irregularities by cardiac catheterization in 2016. Non-ischemic stress test in 2018.  She has had chronic chest pain for many years.  This is unchanged.  She takes nitroglycerin  without relief. Symptoms do not sound cardiac. No further testing needed at this time. - Continue atorvastatin  40 mg daily, ASA 81 mg daily

## 2023-12-23 NOTE — Assessment & Plan Note (Signed)
 Mild diastolic dysfunction with EF 60-65% by echocardiogram in 2016. Volume status is stable.  She is NYHA IIb.  Currently on spironolactone 25 mg daily.  She does not need loop diuretic at this point. Will hold off on considering SGLT2 inhibitor for now. - Continue spironolactone 25 mg daily

## 2023-12-23 NOTE — Assessment & Plan Note (Signed)
 Blood pressure is markedly uncontrolled.  Unfortunately, she has not taken any medications yet today. There is confusion regarding her medication regimen.  She is currently taking carvedilol , isosorbide , losartan , and spironolactone.  She notes that she has been on spironolactone for several months.  Initially, she listed metoprolol  100 mg twice daily.  However, after further questioning, she is not on metoprolol .  - Continue carvedilol  6.25 mg twice daily - Continue isosorbide  mononitrate 30 mg daily - Continue losartan  100 mg daily - Continue spironolactone 25 mg daily - Start amlodipine 5 mg daily - She had recent labs with primary care.  We could not verify with them what she had done.  I wanted to get a basic metabolic panel today to recheck her potassium and creatinine.  The patient declined further blood tests today.

## 2024-01-30 ENCOUNTER — Encounter: Payer: Self-pay | Admitting: *Deleted

## 2024-01-30 NOTE — Progress Notes (Deleted)
 {This patient may be at risk for Amyloid. She has one or more dx on the prob list or PMH from the following list -  Abnormal EKG, HFpEF/Diastolic CHF, Aortic Stenosis, LVH, Bilateral Carpal Tunnel Syndrome, Biceps Tendon Rupture, Spinal Stenosis, Pericardial Effusion, Left Atrial Enlargement, Conduction System Disorder. See list below or review PMH.  Diagnoses From Problem List           Noted     (HFpEF) heart failure with preserved ejection fraction (HCC) 03/20/2013     Chronic diastolic CHF (congestive heart failure) (HCC) 04/21/2015     Spinal stenosis of lumbar region 03/20/2013    Click HERE to open Cardiac Amyloid Screening SmartSet to order screening OR Click HERE to defer testing for 1 year or permanently :1}     OFFICE NOTE:    Date:  01/31/2024  ID:  NAEVIA UNTERREINER, DOB 1946-05-30, MRN [id number] PCP: Delores Rojelio Caldron, NP  [facility] Providers Cardiologist:  Jerel Balding, MD Cardiology APP:  Lelon Glendia DASEN, PA-C { Click to update primary MD,subspecialty MD or APP then REFRESH:1}      *** Coronary artery disease  LHC 04/23/2015: Nonobstructive CAD with minimal luminal irregularities Lexiscan  MPI 03/22/2017: EF 57, no ischemia or infarction Chronic chest pain (noncardiac, MSK) (HFpEF) heart failure with preserved ejection fraction  TTE 04/22/2015: EF 60-65, no RWMA, GR 1 DD Peripheral arterial disease  Hx of bilat SFA occlusions (Dr. Harvey) Chronic kidney disease Hypertension  Hyperlipidemia  Hx of hyperkalemia - Spironolactone not used  Giant cell arteritis  N/N Anemia OSA       Discussed the use of AI scribe software for clinical note transcription with the patient, who gave verbal consent to proceed. History of Present Illness [name] is a 78 y.o. female who returns for follow up of HTN, CHF, CAD. She was last seen in 11/2023. Amlodipine  was added due to uncontrolled BP.     ROS-See HPI***    Studies Reviewed:      *** Results  Risk  Assessment/Calculations: {Does this patient have ATRIAL FIBRILLATION?:[phone]} No BP recorded.  {Refresh Note OR Click here to enter BP  :1}***      Physical Exam:  VS:  There were no vitals taken for this visit.       Wt Readings from Last 3 Encounters:  12/23/23 141 lb 3.2 oz (64 kg)  12/05/23 140 lb 3.2 oz (63.6 kg)  04/20/23 142 lb 3.2 oz (64.5 kg)    Physical Exam***     Assessment and Plan:    Assessment & Plan Essential hypertension  Coronary artery disease due to lipid rich plaque  Chronic heart failure with preserved ejection fraction (HCC)  Assessment and Plan Assessment & Plan    {   Essential hypertension Blood pressure is markedly uncontrolled.  Unfortunately, she has not taken any medications yet today. There is confusion regarding her medication regimen.  She is currently taking carvedilol , isosorbide , losartan , and spironolactone.  She notes that she has been on spironolactone for several months.  Initially, she listed metoprolol  100 mg twice daily.  However, after further questioning, she is not on metoprolol .  - Continue carvedilol  6.25 mg twice daily - Continue isosorbide  mononitrate 30 mg daily - Continue losartan  100 mg daily - Continue spironolactone 25 mg daily - Start amlodipine  5 mg daily - She had recent labs with primary care.  We could not verify with them what she had done.  I wanted to get a  basic metabolic panel today to recheck her potassium and creatinine.  The patient declined further blood tests today. Coronary artery disease due to lipid rich plaque Minimal luminal irregularities by cardiac catheterization in 2016. Non-ischemic stress test in 2018.  She has had chronic chest pain for many years.  This is unchanged.  She takes nitroglycerin  without relief. Symptoms do not sound cardiac. No further testing needed at this time. - Continue atorvastatin  40 mg daily, ASA 81 mg daily Chronic heart failure with preserved ejection fraction  (HCC) Mild diastolic dysfunction with EF 60-65% by echocardiogram in 2016. Volume status is stable.  She is NYHA IIb.  Currently on spironolactone 25 mg daily.  She does not need loop diuretic at this point. Will hold off on considering SGLT2 inhibitor for now. - Continue spironolactone 25 mg daily Palpitations She was recently placed on an event monitor (Zio) by primary care.  She is still wearing the device.   :1}    {Are you ordering a CV Procedure (e.g. stress test, cath, DCCV, TEE, etc)?   Press F2        :[id number]}  Dispo:  No follow-ups on file.  Signed, Glendia Ferrier, PA-C

## 2024-01-31 ENCOUNTER — Ambulatory Visit: Attending: Physician Assistant | Admitting: Physician Assistant

## 2024-01-31 DIAGNOSIS — I2510 Atherosclerotic heart disease of native coronary artery without angina pectoris: Secondary | ICD-10-CM

## 2024-01-31 DIAGNOSIS — I10 Essential (primary) hypertension: Secondary | ICD-10-CM

## 2024-01-31 DIAGNOSIS — I5032 Chronic diastolic (congestive) heart failure: Secondary | ICD-10-CM

## 2024-02-21 ENCOUNTER — Other Ambulatory Visit: Payer: Self-pay

## 2024-02-21 MED ORDER — ISOSORBIDE MONONITRATE ER 30 MG PO TB24: 30.0000 mg | ORAL_TABLET | Freq: Every day | ORAL | 2 refills | Status: AC

## 2024-04-06 ENCOUNTER — Ambulatory Visit: Attending: Cardiovascular Disease | Admitting: Cardiovascular Disease

## 2024-04-09 ENCOUNTER — Encounter: Payer: Self-pay | Admitting: Cardiovascular Disease

## 2024-04-17 ENCOUNTER — Other Ambulatory Visit (HOSPITAL_BASED_OUTPATIENT_CLINIC_OR_DEPARTMENT_OTHER): Payer: Self-pay | Admitting: Nurse Practitioner

## 2024-04-17 ENCOUNTER — Other Ambulatory Visit (HOSPITAL_COMMUNITY): Payer: Self-pay | Admitting: Nurse Practitioner

## 2024-04-17 DIAGNOSIS — E2839 Other primary ovarian failure: Secondary | ICD-10-CM

## 2024-04-17 DIAGNOSIS — R413 Other amnesia: Secondary | ICD-10-CM

## 2024-04-23 ENCOUNTER — Ambulatory Visit (HOSPITAL_COMMUNITY)

## 2024-04-26 ENCOUNTER — Encounter (HOSPITAL_COMMUNITY): Payer: Self-pay

## 2024-04-26 ENCOUNTER — Ambulatory Visit (HOSPITAL_COMMUNITY): Admission: RE | Admit: 2024-04-26 | Source: Ambulatory Visit

## 2024-05-10 ENCOUNTER — Ambulatory Visit (HOSPITAL_COMMUNITY)
Admission: RE | Admit: 2024-05-10 | Discharge: 2024-05-10 | Disposition: A | Discharge status: Home / Self Care | Source: Ambulatory Visit | Attending: Nurse Practitioner | Admitting: Nurse Practitioner

## 2024-05-10 DIAGNOSIS — R413 Other amnesia: Secondary | ICD-10-CM | POA: Insufficient documentation

## 2024-05-10 MED ORDER — GADOBUTROL 1 MMOL/ML IV SOLN
6.0000 mL | Freq: Once | INTRAVENOUS | Status: AC | PRN
  Administered 2024-05-10: 6 mL via INTRAVENOUS

## 2024-05-28 ENCOUNTER — Emergency Department (HOSPITAL_COMMUNITY)

## 2024-05-28 ENCOUNTER — Other Ambulatory Visit: Payer: Self-pay

## 2024-05-28 ENCOUNTER — Encounter (HOSPITAL_COMMUNITY): Payer: Self-pay | Admitting: *Deleted

## 2024-05-28 ENCOUNTER — Emergency Department (HOSPITAL_COMMUNITY)
Admission: EM | Admit: 2024-05-28 | Discharge: 2024-05-28 | Disposition: A | Discharge status: Home / Self Care | Attending: Emergency Medicine | Admitting: Emergency Medicine

## 2024-05-28 DIAGNOSIS — M7989 Other specified soft tissue disorders: Secondary | ICD-10-CM | POA: Diagnosis not present

## 2024-05-28 DIAGNOSIS — E1122 Type 2 diabetes mellitus with diabetic chronic kidney disease: Secondary | ICD-10-CM | POA: Diagnosis not present

## 2024-05-28 DIAGNOSIS — R0602 Shortness of breath: Secondary | ICD-10-CM | POA: Diagnosis not present

## 2024-05-28 DIAGNOSIS — D649 Anemia, unspecified: Secondary | ICD-10-CM | POA: Insufficient documentation

## 2024-05-28 DIAGNOSIS — I129 Hypertensive chronic kidney disease with stage 1 through stage 4 chronic kidney disease, or unspecified chronic kidney disease: Secondary | ICD-10-CM | POA: Insufficient documentation

## 2024-05-28 DIAGNOSIS — M549 Dorsalgia, unspecified: Secondary | ICD-10-CM | POA: Diagnosis present

## 2024-05-28 DIAGNOSIS — M5441 Lumbago with sciatica, right side: Secondary | ICD-10-CM | POA: Diagnosis not present

## 2024-05-28 DIAGNOSIS — Z91040 Latex allergy status: Secondary | ICD-10-CM | POA: Insufficient documentation

## 2024-05-28 DIAGNOSIS — R600 Localized edema: Secondary | ICD-10-CM | POA: Diagnosis not present

## 2024-05-28 DIAGNOSIS — Z7982 Long term (current) use of aspirin: Secondary | ICD-10-CM | POA: Insufficient documentation

## 2024-05-28 DIAGNOSIS — R079 Chest pain, unspecified: Secondary | ICD-10-CM | POA: Diagnosis not present

## 2024-05-28 DIAGNOSIS — N838 Other noninflammatory disorders of ovary, fallopian tube and broad ligament: Secondary | ICD-10-CM | POA: Diagnosis not present

## 2024-05-28 DIAGNOSIS — M25561 Pain in right knee: Secondary | ICD-10-CM

## 2024-05-28 DIAGNOSIS — I2510 Atherosclerotic heart disease of native coronary artery without angina pectoris: Secondary | ICD-10-CM | POA: Diagnosis not present

## 2024-05-28 DIAGNOSIS — N183 Chronic kidney disease, stage 3 unspecified: Secondary | ICD-10-CM | POA: Insufficient documentation

## 2024-05-28 LAB — CBC WITH DIFFERENTIAL/PLATELET
Abs Immature Granulocytes: 0.01 K/uL (ref 0.00–0.07)
Basophils Absolute: 0 K/uL (ref 0.0–0.1)
Basophils Relative: 0 %
Eosinophils Absolute: 0.1 K/uL (ref 0.0–0.5)
Eosinophils Relative: 2 %
HCT: 32.8 % — ABNORMAL LOW (ref 36.0–46.0)
Hemoglobin: 10.2 g/dL — ABNORMAL LOW (ref 12.0–15.0)
Immature Granulocytes: 0 %
Lymphocytes Relative: 15 %
Lymphs Abs: 0.9 K/uL (ref 0.7–4.0)
MCH: 29.7 pg (ref 26.0–34.0)
MCHC: 31.1 g/dL (ref 30.0–36.0)
MCV: 95.3 fL (ref 80.0–100.0)
Monocytes Absolute: 0.4 K/uL (ref 0.1–1.0)
Monocytes Relative: 7 %
Neutro Abs: 4.6 K/uL (ref 1.7–7.7)
Neutrophils Relative %: 76 %
Platelets: 251 K/uL (ref 150–400)
RBC: 3.44 MIL/uL — ABNORMAL LOW (ref 3.87–5.11)
RDW: 13.3 % (ref 11.5–15.5)
WBC: 6.1 K/uL (ref 4.0–10.5)
nRBC: 0 % (ref 0.0–0.2)

## 2024-05-28 LAB — COMPREHENSIVE METABOLIC PANEL WITH GFR
ALT: 15 U/L (ref 0–44)
AST: 20 U/L (ref 15–41)
Albumin: 3.3 g/dL — ABNORMAL LOW (ref 3.5–5.0)
Alkaline Phosphatase: 62 U/L (ref 38–126)
Anion gap: 7 (ref 5–15)
BUN: 15 mg/dL (ref 8–23)
CO2: 28 mmol/L (ref 22–32)
Calcium: 9.3 mg/dL (ref 8.9–10.3)
Chloride: 104 mmol/L (ref 98–111)
Creatinine, Ser: 1.08 mg/dL — ABNORMAL HIGH (ref 0.44–1.00)
GFR, Estimated: 52 mL/min — ABNORMAL LOW (ref >60)
Glucose, Bld: 88 mg/dL (ref 70–99)
Potassium: 3.5 mmol/L (ref 3.5–5.1)
Sodium: 139 mmol/L (ref 135–145)
Total Bilirubin: 1.3 mg/dL — ABNORMAL HIGH (ref 0.0–1.2)
Total Protein: 8.1 g/dL (ref 6.5–8.1)

## 2024-05-28 LAB — URINALYSIS, W/ REFLEX TO CULTURE (INFECTION SUSPECTED)
Bacteria, UA: NONE SEEN
Bilirubin Urine: NEGATIVE
Glucose, UA: NEGATIVE mg/dL
Hgb urine dipstick: NEGATIVE
Ketones, ur: NEGATIVE mg/dL
Leukocytes,Ua: NEGATIVE
Nitrite: NEGATIVE
Protein, ur: NEGATIVE mg/dL
Specific Gravity, Urine: 1.01 (ref 1.005–1.030)
pH: 7 (ref 5.0–8.0)

## 2024-05-28 LAB — LIPASE, BLOOD: Lipase: 20 U/L (ref 11–51)

## 2024-05-28 LAB — BRAIN NATRIURETIC PEPTIDE: B Natriuretic Peptide: 184.2 pg/mL — ABNORMAL HIGH (ref 0.0–100.0)

## 2024-05-28 LAB — TROPONIN I (HIGH SENSITIVITY)
Troponin I (High Sensitivity): 3 ng/L (ref <18)
Troponin I (High Sensitivity): 3 ng/L (ref <18)

## 2024-05-28 MED ORDER — LOSARTAN POTASSIUM 50 MG PO TABS
100.0000 mg | ORAL_TABLET | Freq: Once | ORAL | Status: AC
  Administered 2024-05-28: 100 mg via ORAL
  Filled 2024-05-28: qty 2

## 2024-05-28 MED ORDER — HYDRALAZINE HCL 20 MG/ML IJ SOLN
10.0000 mg | Freq: Once | INTRAMUSCULAR | Status: DC
  Filled 2024-05-28: qty 1

## 2024-05-28 MED ORDER — FENTANYL CITRATE (PF) 50 MCG/ML IJ SOSY
50.0000 ug | PREFILLED_SYRINGE | Freq: Once | INTRAMUSCULAR | Status: AC
  Administered 2024-05-28: 50 ug via INTRAVENOUS
  Filled 2024-05-28: qty 1

## 2024-05-28 MED ORDER — IOHEXOL 350 MG/ML SOLN
100.0000 mL | Freq: Once | INTRAVENOUS | Status: AC | PRN
  Administered 2024-05-28: 100 mL via INTRAVENOUS

## 2024-05-28 NOTE — ED Notes (Signed)
 Patient transported to Ultrasound

## 2024-05-28 NOTE — Discharge Instructions (Addendum)
 You need make an appointment to have close follow-up with your primary care doctor.  You have a growth close to your ovary that is concerning for possible ovarian cancer and you need to have further evaluation for this.   Please see the number listed for the cancer center to establish care Your blood pressure has been elevated.  Make sure that you are taking your blood pressure medication.  Return to the emergency room if you have any worsening symptoms.

## 2024-05-28 NOTE — ED Triage Notes (Signed)
 Patient presents to the ED via [facility] from her doctor office, states she went in for a routine visit. C/o swelling to lower back of which she is currently in a pain management clinic for same. C/o left ankle swelling

## 2024-05-28 NOTE — Progress Notes (Signed)
 BLE venous duplex has been completed.  Preliminary results given to Dr. Lenor.   Results can be found under chart review under CV PROC. 05/28/2024 12:19 PM Marijean Montanye RVT, RDMS

## 2024-05-28 NOTE — ED Notes (Signed)
 Patient transported to X-ray

## 2024-05-28 NOTE — ED Notes (Signed)
 ED Provider at bedside.

## 2024-05-28 NOTE — ED Provider Notes (Signed)
  Physical Exam  BP (!) 170/70   Pulse (!) 51   Temp 98.1 F (36.7 C) (Oral)   Resp 18   Ht 5' 2 (1.575 m)   Wt 59.4 kg   SpO2 100%   BMI 23.96 kg/m   Physical Exam Vitals and nursing note reviewed.  HENT:     Head: Normocephalic and atraumatic.  Eyes:     Pupils: Pupils are equal, round, and reactive to light.  Cardiovascular:     Rate and Rhythm: Normal rate and regular rhythm.  Pulmonary:     Effort: Pulmonary effort is normal.     Breath sounds: Normal breath sounds.  Abdominal:     Palpations: Abdomen is soft.     Tenderness: There is no abdominal tenderness.  Skin:    General: Skin is warm and dry.  Neurological:     Mental Status: She is alert.  Psychiatric:        Mood and Affect: Mood normal.     Procedures  Procedures  ED Course / MDM   Clinical Course as of 05/28/24 1956  Mon May 28, 2024  1954 Patient's granddaughter is now here at bedside.  I reviewed the findings from earlier concerning for ovarian cancer with her and her granddaughter again.  L-spine without acute changes.  Blood pressure has come down systolic 170s.  Will discharge with instruction for PCP follow-up and provide the number for cancer center here to establish care and further workup .  Stable for discharge at this time  [MP]    Clinical Course User Index [MP] Pamella Ozell LABOR, DO   Medical Decision Making I, Ozell Pamella DO, have assumed care of this patient from the previous provider pending CT lumbar spine reevaluation and disposition.  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 Radiology: ordered.  Risk Prescription drug management.          Pamella Ozell LABOR, DO 05/28/24 1956

## 2024-05-28 NOTE — ED Provider Notes (Signed)
 [place] EMERGENCY DEPARTMENT AT [facility] Provider Note   CSN: [id number] Arrival date & time: 05/28/24  1002     Patient presents with: Back Pain and Leg Swelling   [name] is a 78 y.o. female.   Patient is a 78 year old female with a history of hypertension, coronary artery disease, GERD, hyperlipidemia, diabetes, chronic kidney disease and some cognitive decline.  She reports that she had an MRI in October.  On chart review I see that it was October 20.  She said she had the MRI because she had had some cognitive decline and her doctors were concerned about dementia.  She said since she had the MRI she has noticed some increase in her lower back pain.  This pain in her right lower back going down her right buttocks area.  She says worse with movements.  She denies any numbness or weakness in the legs.  No difficulty with bowel or bladder control.  She has also noted some swelling of her left leg.  She said that started about the same amount of time and then her right leg is a little bit swollen.  She denies any baseline swelling of her legs.  She has been having some chest pain.  She is not a very good historian and cannot tell me too much about the chest pain although she says it seems to get worse when her back pain is worse.  She also feels short of breath at times when her pain is worse.  She denies any chest pain currently or shortness of breath.  No fevers.  She has had a history of sciatica but she said this feels a little different.  She feels like it swollen in her right lower back.  She also has some pain in her abdomen.  She feels like that is more of a chronic pain.       Prior to Admission medications   Medication Sig Start Date End Date Taking? Authorizing Provider  allopurinol  (ZYLOPRIM ) 100 MG tablet Take 100 mg by mouth 2 (two) times daily. 10/24/22   [provider]  amLODipine  (NORVASC ) 5 MG tablet Take 1 tablet (5 mg total) by mouth daily.  12/23/23   Lelon Glendia DASEN, PA-C  aspirin  EC 81 MG tablet Take 1 tablet (81 mg total) by mouth daily. 02/27/14   Micky Reyes BIRCH, MD  atorvastatin  (LIPITOR) 40 MG tablet Take 1 tablet (40 mg total) by mouth daily. *PATIENT NEEDS AN APPOINTMENT FOR FURTHER REFILLS TO BE GRANTED OR OBTAIN FROM PCP* 08/30/16   Army Katheryn BROCKS, NP  carvedilol  (COREG ) 6.25 MG tablet Take 1 tablet (6.25 mg total) by mouth 2 (two) times daily. 12/05/23   Croitoru, Mihai, MD  Cholecalciferol (VITAMIN D3) 5000 units TABS Take 5,000 Units by mouth daily.    [provider]  citalopram  (CELEXA ) 20 MG tablet Take 20 mg by mouth daily.     [provider]  HYDROcodone -acetaminophen  (NORCO) 10-325 MG tablet Take 1 tablet by mouth 4 (four) times daily as needed. 04/13/23   [provider]  isosorbide  mononitrate (IMDUR ) 30 MG 24 hr tablet Take 1 tablet (30 mg total) by mouth daily. 02/21/24   Lelon Glendia T, PA-C  losartan  (COZAAR ) 100 MG tablet Take 1 tablet (100 mg total) by mouth daily. 12/05/23   Croitoru, Jerel, MD  NITROSTAT  0.4 MG SL tablet DISSOLVE ONE TABLET UNDER TONGUE AS NEEDED FOR CHEST PAIN EVERY 5 MINUTES 04/27/14   Micky Reyes  D, MD  oxybutynin (DITROPAN XL) 15 MG 24 hr tablet Take 15 mg by mouth daily. 10/24/23   [provider]  oxyCODONE -acetaminophen  (ROXICET) 5-325 MG tablet Take 1-2 tablets by mouth every 4 (four) hours as needed for severe pain. 09/18/15   Laliberte, Danielle, PA-C  spironolactone (ALDACTONE) 25 MG tablet Take 25 mg by mouth daily. 12/20/23   [provider]  traZODone  (DESYREL ) 100 MG tablet Take 100 mg by mouth at bedtime as needed for sleep.  09/10/14   [provider]    Allergies: Latex and Amoxicillin    Review of Systems  Constitutional:  Negative for chills, diaphoresis, fatigue and fever.  HENT:  Negative for congestion, rhinorrhea and sneezing.   Eyes: Negative.   Respiratory:  Positive for shortness of breath. Negative for cough.    Cardiovascular:  Positive for chest pain and leg swelling.  Gastrointestinal:  Positive for abdominal pain. Negative for blood in stool, diarrhea, nausea and vomiting.  Genitourinary:  Negative for difficulty urinating, flank pain, frequency and hematuria.  Musculoskeletal:  Positive for arthralgias (Right knee pain, leg pain) and back pain.  Skin:  Negative for rash.  Neurological:  Negative for dizziness, speech difficulty, weakness, numbness and headaches.    Updated Vital Signs BP (!) 201/78   Pulse (!) 54   Temp 97.7 F (36.5 C)   Resp 18   Ht 5' 2 (1.575 m)   Wt 59.4 kg   SpO2 100%   BMI 23.96 kg/m   Physical Exam Constitutional:      Appearance: She is well-developed.  HENT:     Head: Normocephalic and atraumatic.  Eyes:     Pupils: Pupils are equal, round, and reactive to light.  Cardiovascular:     Rate and Rhythm: Normal rate and regular rhythm.     Heart sounds: Normal heart sounds.  Pulmonary:     Effort: Pulmonary effort is normal. No respiratory distress.     Breath sounds: Normal breath sounds. No wheezing or rales.  Chest:     Chest wall: No tenderness.  Abdominal:     General: Bowel sounds are normal.     Palpations: Abdomen is soft.     Tenderness: There is no abdominal tenderness. There is no guarding or rebound.  Musculoskeletal:        General: Normal range of motion.     Cervical back: Normal range of motion and neck supple.     Comments: Positive tenderness to her lower lumbar region with pain in the right paralumbar area.  No obvious swelling or deformities noted.  Negative straight leg raise.  She has normal sensation and motor function of the lower extremities bilaterally.  EXTR tibial pulses are present by Doppler.  Her feet are warm to the touch.  She has 2+ edema to the left lower extremity, 1+ edema to the right lower extremity, there is pain in the posterior aspect of both knees.  Lymphadenopathy:     Cervical: No cervical adenopathy.   Skin:    General: Skin is warm and dry.     Findings: No rash.  Neurological:     Mental Status: She is alert and oriented to person, place, and time.     (all labs ordered are listed, but only abnormal results are displayed) Labs Reviewed  COMPREHENSIVE METABOLIC PANEL WITH GFR - Abnormal; Notable for the following components:      Result Value   Creatinine, Ser 1.08 (*)    Albumin  3.3 (*)  Total Bilirubin 1.3 (*)    GFR, Estimated 52 (*)    All other components within normal limits  CBC WITH DIFFERENTIAL/PLATELET - Abnormal; Notable for the following components:   RBC 3.44 (*)    Hemoglobin 10.2 (*)    HCT 32.8 (*)    All other components within normal limits  BRAIN NATRIURETIC PEPTIDE - Abnormal; Notable for the following components:   B Natriuretic Peptide 184.2 (*)    All other components within normal limits  LIPASE, BLOOD  TROPONIN I (HIGH SENSITIVITY)  TROPONIN I (HIGH SENSITIVITY)    EKG: None  Radiology: CT Angio Chest/Abd/Pel for Dissection W and/or W/WO Result Date: 05/28/2024 CLINICAL DATA:  Acute aortic syndrome suspected. Chest and abdominal pain. EXAM: CT ANGIOGRAPHY CHEST, ABDOMEN AND PELVIS TECHNIQUE: Non-contrast CT of the chest was initially obtained. Multidetector CT imaging through the chest, abdomen and pelvis was performed using the standard protocol during bolus administration of intravenous contrast. Multiplanar reconstructed images and MIPs were obtained and reviewed to evaluate the vascular anatomy. RADIATION DOSE REDUCTION: This exam was performed according to the departmental dose-optimization program which includes automated exposure control, adjustment of the mA and/or kV according to patient size and/or use of iterative reconstruction technique. CONTRAST:  OMNIPAQUE  IOHEXOL  350 MG/ML SOLN COMPARISON:  CT abdomen pelvis 12/07/2022, MR abdomen 12/07/2022. FINDINGS: CTA CHEST FINDINGS Cardiovascular: No evidence of an intramural hematoma on  precontrast imaging or aneurysm or dissection on postcontrast imaging. Atherosclerotic calcification of the aorta and coronary arteries. Enlarged pulmonic trunk and heart. No pericardial effusion. Mediastinum/Nodes: No pathologically enlarged mediastinal, hilar or axillary lymph nodes. Esophagus is grossly unremarkable. Lungs/Pleura: Lungs are clear. No pleural fluid. Airway is unremarkable. Musculoskeletal: Degenerative changes in the spine. Right shoulder arthroplasty. Review of the MIP images confirms the above findings. CTA ABDOMEN AND PELVIS FINDINGS VASCULAR Aorta: Atherosclerotic calcification.  No aneurysm or dissection. Celiac: Ostial calcification with luminal narrowing (series 10, image 38). SMA: Ostial calcification with mild luminal narrowing. Renals: Bilateral ostial calcification.  Two left renal arteries. IMA: Patent. Inflow: Atherosclerotic without aneurysm or dissection. Attenuation of the left internal iliac artery with reconstitution. Veins: Poorly evaluated due to lack opacification. Review of the MIP images confirms the above findings. NON-VASCULAR Hepatobiliary: Probable small cyst in the dome of the right hepatic lobe. Cholecystectomy. Similar biliary ductal dilatation. Pancreas: Negative. Spleen: Negative. Adrenals/Urinary Tract: Adrenal glands are unremarkable. Renal cortical thinning bilaterally. Kidneys are otherwise unremarkable. Ureters are decompressed. Bladder is grossly unremarkable. Stomach/Bowel: Stomach, small bowel and colon are unremarkable. Appendix is not readily visualized. Lymphatic: Abdominal retroperitoneal lymph nodes measure up to 8 mm in the left periaortic station (4/179), previously 5 mm. Reproductive: Cystic and solid mass in the central anatomic pelvis measures approximately 6.8 x 8.1 cm (4/238), new from 12/07/2022. Other: Small pelvic free fluid. Musculoskeletal: Degenerative changes in the spine. Review of the MIP images confirms the above findings. IMPRESSION:  1. No evidence of acute aortic syndrome. 2. Complex cystic and solid mass in the central anatomic pelvis, worrisome for ovarian cancer. Nonemergent MR pelvis without and with contrast is recommended. 3. Small upper abdominal retroperitoneal lymph nodes, increased in size from 12/07/2022. Metastatic disease is not excluded. 4. Small pelvic free fluid. 5. Aortic atherosclerosis (ICD10-I70.0). Atherosclerotic narrowing of the celiac trunk. Electronically Signed   By: Newell Eke M.D.   On: 05/28/2024 14:47   VAS US  LOWER EXTREMITY VENOUS (DVT) (7a-7p) Result Date: 05/28/2024  Lower Venous DVT Study Patient Name:  JESUSITA JOCELYN  Date  of Exam:   05/28/2024 Medical Rec #: [id number]       Accession #:    [id number] Date of Birth: Jan 02, 1946       Patient Gender: F Patient Age:   69 years Exam Location:  [facility] Procedure:      VAS US  LOWER EXTREMITY VENOUS (DVT) Referring Phys: Dagon Budai --------------------------------------------------------------------------------  Indications: Edema.  Limitations: Poor ultrasound/tissue interface. Comparison Study: No previous exams Performing Technologist: Jody Hill RVT, RDMS  Examination Guidelines: A complete evaluation includes B-mode imaging, spectral Doppler, color Doppler, and power Doppler as needed of all accessible portions of each vessel. Bilateral testing is considered an integral part of a complete examination. Limited examinations for reoccurring indications may be performed as noted. The reflux portion of the exam is performed with the patient in reverse Trendelenburg.  +---------+---------------+---------+-----------+----------+--------------+ RIGHT    CompressibilityPhasicitySpontaneityPropertiesThrombus Aging +---------+---------------+---------+-----------+----------+--------------+ CFV      Full           Yes      Yes                                 +---------+---------------+---------+-----------+----------+--------------+ SFJ       Full                                                        +---------+---------------+---------+-----------+----------+--------------+ FV Prox  Full           Yes      Yes                                 +---------+---------------+---------+-----------+----------+--------------+ FV Mid   Full           Yes      Yes                                 +---------+---------------+---------+-----------+----------+--------------+ FV DistalFull           Yes      Yes                                 +---------+---------------+---------+-----------+----------+--------------+ PFV      Full                                                        +---------+---------------+---------+-----------+----------+--------------+ POP      Full           Yes      Yes                                 +---------+---------------+---------+-----------+----------+--------------+ PTV      Full                                                        +---------+---------------+---------+-----------+----------+--------------+  PERO     Full                                                        +---------+---------------+---------+-----------+----------+--------------+   +---------+---------------+---------+-----------+----------+--------------+ LEFT     CompressibilityPhasicitySpontaneityPropertiesThrombus Aging +---------+---------------+---------+-----------+----------+--------------+ CFV      Full           Yes      Yes                                 +---------+---------------+---------+-----------+----------+--------------+ SFJ      Full                                                        +---------+---------------+---------+-----------+----------+--------------+ FV Prox  Full           Yes      Yes                                 +---------+---------------+---------+-----------+----------+--------------+ FV Mid   Full           Yes      Yes                                  +---------+---------------+---------+-----------+----------+--------------+ FV DistalFull           Yes      Yes                                 +---------+---------------+---------+-----------+----------+--------------+ PFV      Full                                                        +---------+---------------+---------+-----------+----------+--------------+ POP      Full           Yes      Yes                                 +---------+---------------+---------+-----------+----------+--------------+ PTV      Full                                                        +---------+---------------+---------+-----------+----------+--------------+ PERO     Full                                                        +---------+---------------+---------+-----------+----------+--------------+  Summary: BILATERAL: - No evidence of deep vein thrombosis seen in the lower extremities, bilaterally. -No evidence of popliteal cyst, bilaterally.   *See table(s) above for measurements and observations. Electronically signed by Lonni Gaskins MD on 05/28/2024 at 1:12:47 PM.    Final    DG Chest 2 View Result Date: 05/28/2024 CLINICAL DATA:  Chest pain/shortness of breath EXAM: CHEST - 2 VIEW COMPARISON:  None Available. FINDINGS: No hilar mediastinal mass. The lungs are clear. No pleural disease. Right shoulder prosthesis noted. No compression fracture or rib fracture identified. IMPRESSION: No acute disease Electronically Signed   By: Nancyann Burns M.D.   On: 05/28/2024 12:45   DG Knee 2 Views Right Result Date: 05/28/2024 CLINICAL DATA:  Right knee pain and swelling EXAM: RIGHT KNEE - 1-2 VIEW COMPARISON:  May 11, 2013 FINDINGS: There are some marrow calcifications in the right femur which are unchanged. There is mild arthritis in the medial compartment. Small joint effusion evident. There is no fracture identified. IMPRESSION: Arthritis with small joint  effusion.  No acute abnormality. Electronically Signed   By: Nancyann Burns M.D.   On: 05/28/2024 12:44     Procedures   Medications Ordered in the ED  losartan  (COZAAR ) tablet 100 mg (has no administration in time range)  fentaNYL  (SUBLIMAZE ) injection 50 mcg (50 mcg Intravenous Given 05/28/24 1336)  iohexol  (OMNIPAQUE ) 350 MG/ML injection 100 mL (100 mLs Intravenous Contrast Given 05/28/24 1331)                                    Medical Decision Making Amount and/or Complexity of Data Reviewed Labs: ordered. Radiology: ordered.  Risk Prescription drug management.   This patient presents to the ED for concern of back pain, this involves an extensive number of treatment options, and is a complaint that carries with it a high risk of complications and morbidity.  I considered the following differential and admission for this acute, potentially life threatening condition.  The differential diagnosis includes musculoskeletal back pain, spinal mass, cauda equina, aortic dissection/aneurysm, urinary tract infection.  MDM:    Patient is a 78 year old who presents with multiple complaints.  Her main complaint is worsening back pain with some radiation down her right leg.  She also has swelling of both of her legs, more on the left.  She has some chest pain and intermittent shortness of breath as well as some abdominal pain.  Her labs are nonconcerning.  Troponin is normal.  No sign of fluid overload/pulmonary edema.  X-ray does not show any pulmonary edema.  Given her chest and abdominal pain, CTA of her chest was performed.  There is no evidence of aortic dissection or aneurysm.  There is a mass in her lower abdomen concerning for ovarian cancer.  I discussed these findings with the patient and advised that she will need to have close follow-up with her PCP regarding this.  Her blood pressure is little elevated here although she is not symptomatic.  She was given dose of her home blood pressure  medication.  She does not have any focal neurologic deficits or signs of cauda equina.  Awaiting read on her CT lumbar spine images.  Care turned over to Dr. Pamella awaiting these results.  (Labs, imaging, consults)  Labs: I Ordered, and personally interpreted labs.  The pertinent results include: Mild stable anemia, normal troponins, mildly elevated BNP  Imaging Studies ordered: I ordered  imaging studies including chest x-ray, right knee x-ray, CT chest abdomen pelvis, CT lumbar spine I independently visualized and interpreted imaging. I agree with the radiologist interpretation  Additional history obtained from chart.  External records from outside source obtained and reviewed including prior notes  Cardiac Monitoring: The patient was not maintained on a cardiac monitor.  If on the cardiac monitor, I personally viewed and interpreted the cardiac monitored which showed an underlying rhythm of:    Reevaluation: After the interventions noted above, I reevaluated the patient and found that they have :improved  Social Determinants of Health:    Disposition: Pending  Co morbidities that complicate the patient evaluation  Past Medical History:  Diagnosis Date   Arthritis    Atypical face pain    CAD (coronary artery disease)    Catheterization 2006, mild two-vessel nonobstructive disease   CKD (chronic kidney disease), stage III (HCC)    Complication of anesthesia    sometimes takes more medication to get me to sleep   Diabetes mellitus    Ejection fraction    EF 60%, echo, October, 2010,   Fluid overload    Foot pain, right    November, 2012   GERD (gastroesophageal reflux disease)    Headache(784.0)    Hyperlipidemia    Hypertension    Mitral regurgitation    Mild, echo, October, 2010   Overweight(278.02)    PAD (peripheral artery disease)    Dr. Harvey,  Bilateral superficial femoral artery occlusions with one vessel runoff via the peroneal artery   Shortness of  breath    Sleep apnea    CPAP compliance ???   Stroke [facility])    denies   Urinary incontinence    UTI (lower urinary tract infection)      Medicines Meds ordered this encounter  Medications   fentaNYL  (SUBLIMAZE ) injection 50 mcg   iohexol  (OMNIPAQUE ) 350 MG/ML injection 100 mL   losartan  (COZAAR ) tablet 100 mg    I have reviewed the patients home medicines and have made adjustments as needed  Problem List / ED Course: Problem List Items Addressed This Visit   None Visit Diagnoses       Ovarian mass    -  Primary     Peripheral edema         Acute right-sided low back pain with right-sided sciatica       Relevant Medications   fentaNYL  (SUBLIMAZE ) injection 50 mcg (Completed)                Final diagnoses:  Ovarian mass  Peripheral edema  Acute right-sided low back pain with right-sided sciatica    ED Discharge Orders     None          Lenor Hollering, MD 05/28/24 1540

## 2024-05-29 ENCOUNTER — Other Ambulatory Visit: Payer: Self-pay | Admitting: Nurse Practitioner

## 2024-05-29 DIAGNOSIS — C569 Malignant neoplasm of unspecified ovary: Secondary | ICD-10-CM

## 2024-06-13 ENCOUNTER — Ambulatory Visit
Admission: RE | Admit: 2024-06-13 | Discharge: 2024-06-13 | Disposition: A | Source: Ambulatory Visit | Attending: Nurse Practitioner

## 2024-06-13 DIAGNOSIS — C569 Malignant neoplasm of unspecified ovary: Secondary | ICD-10-CM

## 2024-06-13 MED ORDER — GADOPICLENOL 0.5 MMOL/ML IV SOLN
6.0000 mL | Freq: Once | INTRAVENOUS | Status: AC | PRN
  Administered 2024-06-13: 6 mL via INTRAVENOUS

## 2024-06-26 ENCOUNTER — Telehealth: Payer: Self-pay

## 2024-06-26 NOTE — Telephone Encounter (Signed)
 Spoke with [name] and her granddaughter, [name], regarding her referral to GYN oncology. She has an appointment scheduled with Dr. Eldonna on 12/8 at 9:45. Declined earlier appointment d/t transportation and granddaughters work conflict. Patient agrees to date and time. She has been provided with office address and location. She is also aware of our mask and visitor policy. Patient verbalized understanding and will call with any questions.    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 Pt and Shereena both denied this ever being a diagnosis for [name].

## 2024-07-02 ENCOUNTER — Encounter: Payer: Self-pay | Admitting: Psychiatry

## 2024-07-02 ENCOUNTER — Inpatient Hospital Stay: Admitting: Gynecologic Oncology

## 2024-07-02 ENCOUNTER — Inpatient Hospital Stay

## 2024-07-02 ENCOUNTER — Telehealth: Payer: Self-pay | Admitting: *Deleted

## 2024-07-02 ENCOUNTER — Inpatient Hospital Stay: Attending: Psychiatry | Admitting: Psychiatry

## 2024-07-02 ENCOUNTER — Encounter: Payer: Self-pay | Admitting: Oncology

## 2024-07-02 VITALS — BP 138/66 | HR 55 | Temp 98.6°F | Resp 18 | Ht 62.0 in | Wt 137.2 lb

## 2024-07-02 DIAGNOSIS — R102 Pelvic and perineal pain unspecified side: Secondary | ICD-10-CM | POA: Diagnosis not present

## 2024-07-02 DIAGNOSIS — R591 Generalized enlarged lymph nodes: Secondary | ICD-10-CM | POA: Diagnosis not present

## 2024-07-02 DIAGNOSIS — R350 Frequency of micturition: Secondary | ICD-10-CM | POA: Insufficient documentation

## 2024-07-02 DIAGNOSIS — R1900 Intra-abdominal and pelvic swelling, mass and lump, unspecified site: Secondary | ICD-10-CM

## 2024-07-02 DIAGNOSIS — Z8041 Family history of malignant neoplasm of ovary: Secondary | ICD-10-CM | POA: Diagnosis not present

## 2024-07-02 DIAGNOSIS — K5900 Constipation, unspecified: Secondary | ICD-10-CM | POA: Insufficient documentation

## 2024-07-02 DIAGNOSIS — I2510 Atherosclerotic heart disease of native coronary artery without angina pectoris: Secondary | ICD-10-CM | POA: Insufficient documentation

## 2024-07-02 DIAGNOSIS — I129 Hypertensive chronic kidney disease with stage 1 through stage 4 chronic kidney disease, or unspecified chronic kidney disease: Secondary | ICD-10-CM | POA: Insufficient documentation

## 2024-07-02 DIAGNOSIS — I340 Nonrheumatic mitral (valve) insufficiency: Secondary | ICD-10-CM | POA: Diagnosis not present

## 2024-07-02 DIAGNOSIS — A630 Anogenital (venereal) warts: Secondary | ICD-10-CM | POA: Insufficient documentation

## 2024-07-02 DIAGNOSIS — R935 Abnormal findings on diagnostic imaging of other abdominal regions, including retroperitoneum: Secondary | ICD-10-CM | POA: Diagnosis not present

## 2024-07-02 DIAGNOSIS — Z79899 Other long term (current) drug therapy: Secondary | ICD-10-CM | POA: Insufficient documentation

## 2024-07-02 DIAGNOSIS — Z87891 Personal history of nicotine dependence: Secondary | ICD-10-CM | POA: Diagnosis not present

## 2024-07-02 DIAGNOSIS — R599 Enlarged lymph nodes, unspecified: Secondary | ICD-10-CM | POA: Diagnosis not present

## 2024-07-02 DIAGNOSIS — R6881 Early satiety: Secondary | ICD-10-CM | POA: Diagnosis not present

## 2024-07-02 DIAGNOSIS — R971 Elevated cancer antigen 125 [CA 125]: Secondary | ICD-10-CM | POA: Insufficient documentation

## 2024-07-02 DIAGNOSIS — E1122 Type 2 diabetes mellitus with diabetic chronic kidney disease: Secondary | ICD-10-CM | POA: Diagnosis not present

## 2024-07-02 DIAGNOSIS — Z803 Family history of malignant neoplasm of breast: Secondary | ICD-10-CM | POA: Diagnosis not present

## 2024-07-02 DIAGNOSIS — K219 Gastro-esophageal reflux disease without esophagitis: Secondary | ICD-10-CM | POA: Insufficient documentation

## 2024-07-02 DIAGNOSIS — Z8673 Personal history of transient ischemic attack (TIA), and cerebral infarction without residual deficits: Secondary | ICD-10-CM | POA: Insufficient documentation

## 2024-07-02 DIAGNOSIS — N183 Chronic kidney disease, stage 3 unspecified: Secondary | ICD-10-CM | POA: Insufficient documentation

## 2024-07-02 DIAGNOSIS — M1990 Unspecified osteoarthritis, unspecified site: Secondary | ICD-10-CM | POA: Insufficient documentation

## 2024-07-02 DIAGNOSIS — E785 Hyperlipidemia, unspecified: Secondary | ICD-10-CM | POA: Insufficient documentation

## 2024-07-02 DIAGNOSIS — R3915 Urgency of urination: Secondary | ICD-10-CM | POA: Insufficient documentation

## 2024-07-02 DIAGNOSIS — Z7982 Long term (current) use of aspirin: Secondary | ICD-10-CM | POA: Diagnosis not present

## 2024-07-02 MED ORDER — POLYETHYLENE GLYCOL 3350 17 G PO PACK: 17.0000 g | PACK | Freq: Every day | ORAL | 1 refills | Status: AC

## 2024-07-02 MED ORDER — SENNOSIDES-DOCUSATE SODIUM 8.6-50 MG PO TABS: 2.0000 | ORAL_TABLET | Freq: Every day | ORAL | 0 refills | Status: DC

## 2024-07-02 NOTE — Patient Instructions (Addendum)
 Preparing for your Surgery  Plan for surgery on July 31, 2024 with Dr. [name] at [facility] You will be scheduled for diagnostic laparoscopy (looking into the abdomen with a camera), possible exploratory laparotomy (making a larger incision on the abdomen), bilateral salpingo-oophorectomy (removal of the ovaries and fallopian tubes), debulking, vulvar biopsy.  2 SCENARIOS:  1-Dr. Masters looks into the abdomen with a camera through a small incision. If there is extensive disease and/or bowel surgery is needed, she will take biopsies and finish the case. You would go home the same day.  2-Dr. Masters looks into the abdomen with a camera through a small incision. If able to perform surgery, will proceed with making an open incision on your abdomen and perform major surgery. You would be looking at 3-5 days in the hospital based on how you are doing.   Pre-operative Testing -You will receive a phone call from presurgical testing at [facility] to arrange for a pre-operative appointment and lab work.  -Bring your insurance card, copy of an advanced directive if applicable, medication list  -At that visit, you will be asked to sign a consent for a possible blood transfusion in case a transfusion becomes necessary during surgery.  The need for a blood transfusion is rare but having consent is a necessary part of your care.     -YOU CAN CONTINUE TAKING YOUR BABY ASPIRIN  WITH YOUR LAST DOSE BEING THE DAY BEFORE SURGERY IN THE AM. DO NOT TAKE THE MORNING OF SURGERY.  -Do not take supplements such as fish oil (omega 3), red yeast rice, turmeric before your surgery. STOP TAKING AT LEAST 10 DAYS BEFORE SURGERY. You want to avoid medications with aspirin  in them including headache powders such as BC or Goody's), Excedrin migraine.  -If you are taking a GLP-1 medication/injection such as Ozempic, Mounjaro, [id number], this needs to be held before surgery for at least 7 days  before.  Day Before Surgery at Home -You will need to be on a clear liquid diet the day before surgery. AVOID CARBONATED BEVERAGES.    If your bowels are filled with gas, your surgeon will have difficulty visualizing your pelvic organs which increases your surgical risks.  Your role in recovery Your role is to become active as soon as directed by your doctor, while still giving yourself time to heal.  Rest when you feel tired. You will be asked to do the following in order to speed your recovery:  - Cough and breathe deeply. This helps to clear and expand your lungs and can prevent pneumonia after surgery.  - STAY ACTIVE WHEN YOU GET HOME. Do mild physical activity. Walking or moving your legs help your circulation and body functions return to normal. Do not try to get up or walk alone the first time after surgery.   -If you develop swelling on one leg or the other, pain in the back of your leg, redness/warmth in one of your legs, please call the office or go to the Emergency Room to have a doppler to rule out a blood clot. For shortness of breath, chest pain-seek care in the Emergency Room as soon as possible. - Actively manage your pain. Managing your pain lets you move in comfort. We will ask you to rate your pain on a scale of zero to 10. It is your responsibility to tell your doctor or nurse where and how much you hurt so your pain can be treated.  Special Considerations -If you  are diabetic, you may be placed on insulin  after surgery to have closer control over your blood sugars to promote healing and recovery.  This does not mean that you will be discharged on insulin .  If applicable, your oral antidiabetics will be resumed when you are tolerating a solid diet.  -Your final pathology results from surgery should be available around one week after surgery and the results will be relayed to you when available.  -FMLA forms can be faxed to [phone] and please allow 5-7 business days for  completion.  Pain Management After Surgery -CONTINUE TAKING HYDROCODONE . MONITOR YOUR TYLENOL  INTAKE.  -Make sure that you have Tylenol  IF YOU ARE ABLE TO TAKE THESE MEDICATIONS at home to use on a regular basis after surgery for pain control.   -Review the attached handout on narcotic use and their risks and side effects.   Bowel Regimen -You will be prescribed Sennakot-S to take nightly to prevent constipation especially if you are taking the narcotic pain medication intermittently.  It is important to prevent constipation and drink adequate amounts of liquids. You can stop taking this medication when you are not taking pain medication and you are back on your normal bowel routine.  Risks of Surgery Risks of surgery are low but include bleeding, infection, damage to surrounding structures, re-operation, blood clots, and very rarely death.   Blood Transfusion Information (For the consent to be signed before surgery)  We will be checking your blood type before surgery so in case of emergencies, we will know what type of blood you would need.                                            WHAT IS A BLOOD TRANSFUSION?  A transfusion is the replacement of blood or some of its parts. Blood is made up of multiple cells which provide different functions. Red blood cells carry oxygen and are used for blood loss replacement. White blood cells fight against infection. Platelets control bleeding. Plasma helps clot blood. Other blood products are available for specialized needs, such as hemophilia or other clotting disorders. BEFORE THE TRANSFUSION  Who gives blood for transfusions?  You may be able to donate blood to be used at a later date on yourself (autologous donation). Relatives can be asked to donate blood. This is generally not any safer than if you have received blood from a stranger. The same precautions are taken to ensure safety when a relative's blood is donated. Healthy volunteers who  are fully evaluated to make sure their blood is safe. This is blood bank blood. Transfusion therapy is the safest it has ever been in the practice of medicine. Before blood is taken from a donor, a complete history is taken to make sure that person has no history of diseases nor engages in risky social behavior (examples are intravenous drug use or sexual activity with multiple partners). The donor's travel history is screened to minimize risk of transmitting infections, such as malaria. The donated blood is tested for signs of infectious diseases, such as HIV and hepatitis. The blood is then tested to be sure it is compatible with you in order to minimize the chance of a transfusion reaction. If you or a relative donates blood, this is often done in anticipation of surgery and is not appropriate for emergency situations. It takes many days to process the  donated blood. RISKS AND COMPLICATIONS Although transfusion therapy is very safe and saves many lives, the main dangers of transfusion include:  Getting an infectious disease. Developing a transfusion reaction. This is an allergic reaction to something in the blood you were given. Every precaution is taken to prevent this. The decision to have a blood transfusion has been considered carefully by your caregiver before blood is given. Blood is not given unless the benefits outweigh the risks.  AFTER SURGERY INSTRUCTIONS  Return to work: 4-6 weeks if applicable  If you have open surgery, you will have a white honeycomb dressing over your larger incision. This dressing can be removed 5 days after surgery and you do not need to reapply a new dressing. Once you remove the dressing, you will notice that you have the surgical glue (dermabond) on the incision and this will peel off on its own. You can get this dressing wet in the shower the days after surgery prior to removal on the 5th day.   Activity: 1. Be up and out of the bed during the day.  Take a nap  if needed.  You may walk up steps but be careful and use the hand rail.  Stair climbing will tire you more than you think, you may need to stop part way and rest.   2. No lifting or straining for 6 weeks over 10 pounds. No pushing, pulling, straining for 6 weeks.  3. No driving for 4-89 days when the following criteria have been met: Do not drive if you are taking narcotic pain medicine and make sure that your reaction time has returned.   4. You can shower as soon as the next day after surgery. Shower daily.  Use your regular soap and water (not directly on the incision) and pat your incision(s) dry afterwards; don't rub.  No tub baths or submerging your body in water until cleared by your surgeon. If you have the soap that was given to you by pre-surgical testing that was used before surgery, you do not need to use it afterwards because this can irritate your incisions.   5. No sexual activity and nothing in the vagina for 6 weeks.  6. You may experience a small amount of clear drainage from your incisions, which is normal.  If the drainage persists, increases, or changes color please call the office.  7. Do not use creams, lotions, or ointments such as neosporin on your incisions after surgery until advised by your surgeon because they can cause removal of the dermabond glue on your incisions.    8. You may experience vaginal spotting after surgery or when the stitches at the top of the vagina begin to dissolve.  The spotting is normal but if you experience heavy bleeding, call our office.  9. Take Tylenol  first for pain if you are able to take these medication and only use narcotic pain medication for severe pain not relieved by the Tylenol .  Monitor your Tylenol  intake to a max of 4,000 mg in a 24 hour period. MONITOR YOUR TYLENOL  INTAKE SINCE HYDROCODONE  HAS TYLENOL  IN IT.  Diet: 1. Low sodium Heart Healthy Diet is recommended but you are cleared to resume your normal (before surgery) diet  after your procedure.  2. It is safe to use a laxative, such as Miralax  or Colace, if you have difficulty moving your bowels before surgery. You have been prescribed Sennakot-S to take at bedtime every evening after surgery to keep bowel movements regular and  to prevent constipation.    Wound Care: 1. Keep clean and dry.  Shower daily.  Reasons to call the Doctor: Fever - Oral temperature greater than 100.4 degrees Fahrenheit Foul-smelling vaginal discharge Difficulty urinating Nausea and vomiting Increased pain at the site of the incision that is unrelieved with pain medicine. Difficulty breathing with or without chest pain New calf pain especially if only on one side Sudden, continuing increased vaginal bleeding with or without clots.   Contacts: For questions or concerns you should contact:  Dr. [name] at [phone]  Eleanor Epps, NP at [phone]  After Hours: call [phone] and have the GYN Oncologist paged/contacted (after 5 pm or on the weekends). You will speak with an after hours RN and let he or she know you have had surgery.  Messages sent via mychart are for non-urgent matters and are not responded to after hours so for urgent needs, please call the after hours number.

## 2024-07-02 NOTE — Progress Notes (Signed)
 Patient here for a consult with Dr. Eldonna and for a pre-operative appointment prior to her scheduled surgery on 08/01/2023. She is scheduled for a diagnostic laparoscopy, possible exploratory laparotomy, bilateral salpingo-oophorectomy, debulking, vulvar biopsy. The surgery was discussed in detail.  See after visit summary for additional details.    Discussed post-op pain management in detail including the aspects of the enhanced recovery pathway.  We discussed the use of tylenol  post-op and to monitor for a maximum of 4,000 mg in a 24 hour period.  Also let her know that sennakot will be prescribed to be used after surgery and to hold if having loose stools.  Discussed bowel regimen in detail.     Discussed the use of SCDs and measures to take at home to prevent DVT including frequent mobility.  Reportable signs and symptoms of DVT discussed. Post-operative instructions discussed and expectations for after surgery. Incisional care discussed as well including reportable signs and symptoms including erythema, drainage, wound separation.     30 minutes spent with the patient.  Verbalizing understanding of material discussed. No needs or concerns voiced at the end of the visit.   Advised patient to call for any needs.  Advised that her post-operative medications had been prescribed and could be picked up at any time.    This appointment is included in the global surgical bundle as pre-operative teaching and has no charge.

## 2024-07-02 NOTE — Progress Notes (Unsigned)
 GYNECOLOGIC ONCOLOGY NEW PATIENT CONSULTATION  Date of Service: 07/02/2024 Referring Provider: Delores Rojelio Caldron, NP [address] [place],  [place] [address]   ASSESSMENT AND PLAN: [name] is a 78 y.o. woman with an 8.2 cm complex cystic and solid pelvic mass and left para-aortic prominent lymph nodes, not enlarged by size criteria.  We reviewed that the exact etiology of the pelvic mass is unclear, but could include a benign, borderline, or malignant process.  The recommended treatment is surgical excision to make a definitive diagnosis.  Based on the complex features on imaging as well as the indeterminant para-aortic lymph nodes and the somewhat fixed nature of the mass on exam, this is all suspicious for possible malignant process.  Recommend tumor markers including CA125, CEA, CA 19-9.  Although the mass is relatively small, given the complex features, concern for malignancy, and fixed appearance on exam, I recommend a laparotomy for removal.  In the event of malignancy or borderline tumor on frozen section, we will perform indicated staging procedures. We discussed that these procedures may include omentectomy pelvic and/or para-aortic lymphadenectomy, peritoneal biopsies. We would also remove any tissue concerning for metastatic disease which could require additional procedures including bowel surgery.  Reviewed the risk of ostomy in the setting of bowel surgery and patient is strongly opposed to ostomy.  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  Patient was consented for: Diagnostic laparoscopy, exploratory laparotomy for bilateral salpingo-oophorectomy, possible tumor debulking including lymphadenectomy, omentectomy, vulvar biopsy on 07/31/2024.  Given patient declines ostomy, if bowel surgery would be required  for complete debulking, would plan for biopsy and discontinuation of surgery to proceed with neoadjuvant chemotherapy.  The risks of surgery were discussed in detail and she understands these to including but not limited to bleeding requiring a blood transfusion, infection, injury to adjacent organs (including but not limited to the bowels, bladder, ureters, nerves, blood vessels), thromboembolic events, wound separation, hernia, possible risk of lymphedema and lymphocyst if lymphadenectomy performed, unforseen complication, possible need for re-exploration, medical complications such as heart attack, stroke, pneumonia, and possible permanent ostomy.  If the patient experiences any of these events, she understands that her hospitalization or recovery may be prolonged and that she may need to take additional medications for a prolonged period. The patient will receive DVT and antibiotic prophylaxis as indicated. She voiced a clear understanding. She had the opportunity to ask questions and informed consent was obtained today. She wishes to proceed.  Patient and her granddaughter report they are out of town the 24th to the 30th and strongly desire for surgery after this time.  Given her infrequent bowel movements but stool noted on scan and exam, recommend that patient increase MiraLAX  to 1-2 times daily.  She will proceed to the lab today for tumor markers. Given her cardiac history, will obtain cardiac clearance. All preoperative instructions were reviewed. Postoperative expectations were also reviewed. Written handouts were provided to the patient.   A copy of this note was sent to the patient's referring provider.  [name], MD Gynecologic Oncology   Medical Decision Making I personally spent  TOTAL 80 minutes face-to-face and non-face-to-face in the care of this patient, which includes all pre, intra, and post visit time on the date of service.   ------------  CC: pelvic  mass  HISTORY OF PRESENT ILLNESS:  [name] is a 78 y.o. woman who is seen in consultation at the request of Delores Rojelio Caldron, NP for evaluation of pelvic mass.  Patient presented to her primary care provider on 05/28/2024 for note of swelling and pain in her lower extremities and back.  She noted that she is experiencing pain in the middle of her back and down the right side including the right hip and left foot.  She also noted a knot with also swelling in her left leg and foot.  She also reported 2-day history of chest pain and shortness of breath.  Given her symptoms she was ordered for Dopplers of bilateral lower extremities and recommended to present to the emergency room.  On exam in the emergency room she was noted to have tenderness to palpation of the right paralumbar area, 2+ edema in the left lower extremity, 1+ edema in the right lower extremity and pain in the posterior aspect of both knees.  Bilateral lower extremity Dopplers showed no evidence of DVT.  CT angio of the chest/abdomen/pelvis showed no PE.  There was note of a complex cystic and solid mass in the pelvis as well as small upper abdominal retroperitoneal lymph nodes, metastatic disease not excluded, small pelvic free fluid.  An MR pelvis was also completed which noted an 8.2 cm complex cystic and solid mass in the central pelvis, no pathologically enlarged pelvic lymph nodes, sigmoid diverticulosis without signs of diverticulitis.  No tumor markers are collected.  Today patient presents with her granddaughter.  She reports pelvic pain that has been sharp in nature since March, on and off on some days.  Also reports that she has had a variety of symptoms over the past year that has included bloating, early satiety, increased urinary frequency and urgency, and increased constipation.  She also notes about a 50 pound weight loss over the past 2 years.  In terms of her constipation she has taken MiraLAX  and stool softeners on  and off.  Most recently reports that she is having about 1 bowel movement a week.  Patient currently uses Norco for back pain related to arthritis.  She has additionally been using this for her pelvic pain.  She reports using about 3 to 4 tablets a day most days.  Follows with [facility], last seen Glendia Ferrier, PA-C 12/23/23  In terms of her family history, patient reports that she had a sister with breast cancer in her 9s and 2 brothers with prostate cancer.  Denies any genetic testing in the family that she knows of.  Has no biological children but 1 adopted child.   PAST MEDICAL HISTORY: Past Medical History:  Diagnosis Date   Arthritis    Atypical face pain    CAD (coronary artery disease)    Catheterization 2006, mild two-vessel nonobstructive disease   CKD (chronic kidney disease), stage III (HCC)    Complication of anesthesia    sometimes takes more medication to get me to sleep   Diabetes mellitus    Ejection fraction    EF 60%, echo, October, 2010,   Fluid overload    Foot pain, right    November, 2012   GERD (gastroesophageal reflux disease)    Headache(784.0)    Hyperlipidemia    Hypertension    Mitral regurgitation    Mild, echo, October, 2010   Overweight(278.02)    PAD (peripheral artery disease)    Dr. Harvey,  Bilateral superficial femoral artery occlusions with one vessel runoff via  the peroneal artery   Shortness of breath    Sleep apnea    CPAP compliance ???   Stroke [facility])    denies   Urinary incontinence    UTI (lower urinary tract infection)     PAST SURGICAL HISTORY: Past Surgical History:  Procedure Laterality Date   ARTERY BIOPSY  12/27/2011   Procedure: BIOPSY TEMPORAL ARTERY;  Surgeon: Carlin FORBES Haddock, MD;  Location: [facility] OR;  Service: Vascular;  Laterality: Left;   CARDIAC CATHETERIZATION N/A 04/23/2015   Procedure: Left Heart Cath and Coronary Angiography;  Surgeon: Candyce GORMAN Reek, MD;  Location: [facility] INVASIVE CV LAB;  Service:  Cardiovascular;  Laterality: N/A;   EYE SURGERY Bilateral 12/11/2010   Cataract   REVERSE SHOULDER ARTHROPLASTY Right 09/18/2015   Procedure: REVERSE SHOULDER ARTHROPLASTY;  Surgeon: Eva Herring, MD;  Location: [facility] OR;  Service: Orthopedics;  Laterality: Right;  Right reverse total shoulder arthroplasty   REVERSE TOTAL SHOULDER ARTHROPLASTY Right 09/18/2015   SHOULDER ARTHROSCOPY Right 02/24/2015   VAGINAL HYSTERECTOMY     Due to bleeding, dysmenorrhea    OB/GYN HISTORY: OB History  Gravida Para Term Preterm AB Living  1    1   SAB IAB Ectopic Multiple Live Births  1        # Outcome Date GA Lbr Len/2nd Weight Sex Type Anes PTL Lv  1 SAB      SAB         Age at menarche: 21 Age at menopause: unsure, hysterectomy age ~54s Hx of HRT: no Hx of STI: no Last pap: unknown, pt reports 2016 History of abnormal pap smears: history of vulvar/perianal condyloma treated with laser 10/28/2009  SCREENING STUDIES:  Last mammogram: 2024 per pt report Last colonoscopy: 2024 per pt report  MEDICATIONS:  Current Outpatient Medications:    allopurinol  (ZYLOPRIM ) 100 MG tablet, Take 100 mg by mouth 2 (two) times daily., Disp: , Rfl:    amLODipine  (NORVASC ) 5 MG tablet, Take 1 tablet (5 mg total) by mouth daily., Disp: 30 tablet, Rfl: 11   aspirin  EC 81 MG tablet, Take 1 tablet (81 mg total) by mouth daily., Disp: 90 tablet, Rfl: 3   atorvastatin  (LIPITOR) 40 MG tablet, Take 1 tablet (40 mg total) by mouth daily. *PATIENT NEEDS AN APPOINTMENT FOR FURTHER REFILLS TO BE GRANTED OR OBTAIN FROM PCP*, Disp: 30 tablet, Rfl: 1   carvedilol  (COREG ) 6.25 MG tablet, Take 1 tablet (6.25 mg total) by mouth 2 (two) times daily., Disp: 180 tablet, Rfl: 3   Cholecalciferol (VITAMIN D3) 5000 units TABS, Take 5,000 Units by mouth daily., Disp: , Rfl:    citalopram  (CELEXA ) 20 MG tablet, Take 20 mg by mouth daily. , Disp: , Rfl:    gabapentin  (NEURONTIN ) 100 MG capsule, Take by mouth., Disp: , Rfl:     HYDROcodone -acetaminophen  (NORCO) 10-325 MG tablet, Take 1 tablet by mouth 4 (four) times daily as needed., Disp: , Rfl:    isosorbide  mononitrate (IMDUR ) 30 MG 24 hr tablet, Take 1 tablet (30 mg total) by mouth daily., Disp: 90 tablet, Rfl: 2   losartan  (COZAAR ) 50 MG tablet, Take 50 mg by mouth daily., Disp: , Rfl:    NITROSTAT  0.4 MG SL tablet, DISSOLVE ONE TABLET UNDER TONGUE AS NEEDED FOR CHEST PAIN EVERY 5 MINUTES, Disp: 25 tablet, Rfl: 3   oxybutynin (DITROPAN XL) 15 MG 24 hr tablet, Take 15 mg by mouth daily., Disp: , Rfl:    spironolactone (ALDACTONE) 25 MG tablet, Take 25  mg by mouth daily., Disp: , Rfl:    zolpidem  (AMBIEN ) 5 MG tablet, Take 5 mg by mouth at bedtime as needed for sleep., Disp: , Rfl:   ALLERGIES: Allergies  Allergen Reactions   Latex Itching    un   Amoxicillin Other (See Comments)    Yeast infection    FAMILY HISTORY: Family History  Problem Relation Age of Onset   Diabetes Mother    Hyperlipidemia Mother    Hypertension Mother    Heart disease Father    Diabetes Sister    Hypertension Sister    Hyperlipidemia Sister    Breast cancer Sister 14   Prostate cancer Brother 39   Prostate cancer Brother    Anesthesia problems Neg Hx    Ovarian cancer Neg Hx    Uterine cancer Neg Hx    Colon cancer Neg Hx    Pancreatic cancer Neg Hx     SOCIAL HISTORY: Social History   Socioeconomic History   Marital status: Widowed    Spouse name: Not on file   Number of children: Not on file   Years of education: Not on file   Highest education level: Not on file  Occupational History   Not on file  Tobacco Use   Smoking status: Former    Current packs/day: 0.00    Types: Cigarettes    Start date: 02/15/1958    Quit date: 02/16/2003    Years since quitting: 21.3   Smokeless tobacco: Never   Tobacco comments:    Quit 2008  Substance and Sexual Activity   Alcohol use: Not Currently    Comment: Social Drinker only/////////STOP DRINKING IN 2003   Drug  use: No   Sexual activity: Not Currently    Partners: Male  Other Topics Concern   Not on file  Social History Narrative   Not on file   Social Drivers of Health   Financial Resource Strain: Not on file  Food Insecurity: No Food Insecurity (07/02/2024)   Hunger Vital Sign    Worried About Running Out of Food in the Last Year: Never true    Ran Out of Food in the Last Year: Never true  Transportation Needs: No Transportation Needs (07/02/2024)   PRAPARE - Administrator, Civil Service (Medical): No    Lack of Transportation (Non-Medical): No  Physical Activity: Not on file  Stress: Not on file  Social Connections: Not on file  Intimate Partner Violence: Not At Risk (07/02/2024)   Humiliation, Afraid, Rape, and Kick questionnaire    Fear of Current or Ex-Partner: No    Emotionally Abused: No    Physically Abused: No    Sexually Abused: No    REVIEW OF SYSTEMS: New patient intake form was reviewed.  Complete 10-system review is negative except for the following: Urinary frequency, back pain, numbness, leg swelling  PHYSICAL EXAM: BP 138/66 (BP Location: Right Arm, Patient Position: Sitting)   Pulse (!) 55   Temp 98.6 F (37 C) (Oral)   Resp 18   Ht 5' 2 (1.575 m)   Wt 137 lb 3.2 oz (62.2 kg)   SpO2 100%   BMI 25.09 kg/m  Constitutional: No acute distress. Neuro/Psych: Alert, oriented.  Head and Neck: Normocephalic, atraumatic. Neck symmetric without masses. Sclera anicteric.  Respiratory: Normal work of breathing. Clear to auscultation bilaterally. Cardiovascular: Regular rate and rhythm, no murmurs, rubs, or gallops. Abdomen: Normoactive bowel sounds. Soft, non-distended, non-tender to palpation. No masses appreciated.  Midline  infraumbilical incision well-healed. Extremities: Grossly normal range of motion. Warm, well perfused. No edema bilaterally. Skin: No rashes or lesions. Lymphatic: No cervical, supraclavicular, or inguinal adenopathy. Genitourinary:  External genitalia without with evidence of superficial broad involvement of condylomatous like appearing lesions that are only slightly raised on bilateral labia and around the anus.  Urethral meatus without lesions or prolapse. On speculum exam, vagina with atrophic mucosa, no lesions. Bimanual exam reveals mass palpated at the top of the vagina that fills the pelvis, and mobile, mild tenderness to palpation. Rectovaginal exam confirms with mass filling the posterior cul-de-sac and with external compression on the rectum with minimal mobility separate from rectum without full-thickness invasion noted.  Exam chaperoned by Eleanor Epps, NP   LABORATORY AND RADIOLOGIC DATA: Outside medical records were reviewed to synthesize the above history, along with the history and physical obtained during the visit.  Outside laboratory, pathology, and imaging reports were reviewed, with pertinent results below.  I personally reviewed the outside images.  WBC  Date Value Ref Range Status  05/28/2024 6.1 4.0 - 10.5 K/uL Final   Hemoglobin  Date Value Ref Range Status  05/28/2024 10.2 (L) 12.0 - 15.0 g/dL Final   HCT  Date Value Ref Range Status  05/28/2024 32.8 (L) 36.0 - 46.0 % Final   Platelets  Date Value Ref Range Status  05/28/2024 251 150 - 400 K/uL Final   Magnesium  Date Value Ref Range Status  03/20/2013 1.7 1.5 - 2.5 mg/dL Final   Creat  Date Value Ref Range Status  05/05/2015 1.18 (H) 0.60 - 0.93 mg/dL Final   Creatinine, Ser  Date Value Ref Range Status  05/28/2024 1.08 (H) 0.44 - 1.00 mg/dL Final   AST  Date Value Ref Range Status  05/28/2024 20 15 - 41 U/L Final   ALT  Date Value Ref Range Status  05/28/2024 15 0 - 44 U/L Final   MR PELVIS W WO CONTRAST 06/13/2024  Narrative CLINICAL DATA:  Pelvic mass.  History of ovarian carcinosarcoma.  EXAM: MRI PELVIS WITHOUT AND WITH CONTRAST  TECHNIQUE: Multiplanar multisequence MR imaging of the pelvis was performed both  before and after administration of intravenous contrast.  CONTRAST:  6 mL UA  COMPARISON:  CT on 05/28/2024 and 09/06/2018  FINDINGS: Lower Urinary Tract: 3 cm bladder diverticulum is seen arising from the left posterior bladder wall.  Bowel: Sigmoid diverticulosis, without signs of diverticulitis.  Vascular/Lymphatic: Unremarkable. No pathologically enlarged pelvic lymph nodes identified.  Reproductive:  -- Uterus: Prior hysterectomy.  -- Adnexae: A complex cystic and solid mass is seen in, which is centered in the central pelvis. This measures 8.2 x 4.9 by 6.2 cm and shows solid components showing contrast enhancement particularly along its right posterior wall. This is highly suspicious for ovarian malignancy.  Other: No ascites.  Musculoskeletal:  Unremarkable.  IMPRESSION: 8.2 cm complex cystic and solid mass in the central pelvis, highly suspicious for ovarian malignancy.  Sigmoid diverticulosis, without signs of diverticulitis.  3 cm left-sided bladder diverticulum.   Electronically Signed By: Norleen DELENA Kil M.D. On: 06/18/2024 09:59   CT Angio Chest/Abd/Pel for Dissection W and/or W/WO 05/28/2024  Narrative CLINICAL DATA:  Acute aortic syndrome suspected. Chest and abdominal pain.  EXAM: CT ANGIOGRAPHY CHEST, ABDOMEN AND PELVIS  TECHNIQUE: Non-contrast CT of the chest was initially obtained.  Multidetector CT imaging through the chest, abdomen and pelvis was performed using the standard protocol during bolus administration of intravenous contrast. Multiplanar reconstructed images and MIPs  were obtained and reviewed to evaluate the vascular anatomy.  RADIATION DOSE REDUCTION: This exam was performed according to the departmental dose-optimization program which includes automated exposure control, adjustment of the mA and/or kV according to patient size and/or use of iterative reconstruction technique.  CONTRAST:  OMNIPAQUE  IOHEXOL  350 MG/ML  SOLN  COMPARISON:  CT abdomen pelvis 12/07/2022, MR abdomen 12/07/2022.  FINDINGS: CTA CHEST FINDINGS  Cardiovascular: No evidence of an intramural hematoma on precontrast imaging or aneurysm or dissection on postcontrast imaging. Atherosclerotic calcification of the aorta and coronary arteries. Enlarged pulmonic trunk and heart. No pericardial effusion.  Mediastinum/Nodes: No pathologically enlarged mediastinal, hilar or axillary lymph nodes. Esophagus is grossly unremarkable.  Lungs/Pleura: Lungs are clear. No pleural fluid. Airway is unremarkable.  Musculoskeletal: Degenerative changes in the spine. Right shoulder arthroplasty.  Review of the MIP images confirms the above findings.  CTA ABDOMEN AND PELVIS FINDINGS  VASCULAR  Aorta: Atherosclerotic calcification.  No aneurysm or dissection.  Celiac: Ostial calcification with luminal narrowing (series 10, image 38).  SMA: Ostial calcification with mild luminal narrowing.  Renals: Bilateral ostial calcification.  Two left renal arteries.  IMA: Patent.  Inflow: Atherosclerotic without aneurysm or dissection. Attenuation of the left internal iliac artery with reconstitution.  Veins: Poorly evaluated due to lack opacification.  Review of the MIP images confirms the above findings.  NON-VASCULAR  Hepatobiliary: Probable small cyst in the dome of the right hepatic lobe. Cholecystectomy. Similar biliary ductal dilatation.  Pancreas: Negative.  Spleen: Negative.  Adrenals/Urinary Tract: Adrenal glands are unremarkable. Renal cortical thinning bilaterally. Kidneys are otherwise unremarkable. Ureters are decompressed. Bladder is grossly unremarkable.  Stomach/Bowel: Stomach, small bowel and colon are unremarkable. Appendix is not readily visualized.  Lymphatic: Abdominal retroperitoneal lymph nodes measure up to 8 mm in the left periaortic station (4/179), previously 5 mm.  Reproductive: Cystic and solid mass in  the central anatomic pelvis measures approximately 6.8 x 8.1 cm (4/238), new from 12/07/2022.  Other: Small pelvic free fluid.  Musculoskeletal: Degenerative changes in the spine.  Review of the MIP images confirms the above findings.  IMPRESSION: 1. No evidence of acute aortic syndrome. 2. Complex cystic and solid mass in the central anatomic pelvis, worrisome for ovarian cancer. Nonemergent MR pelvis without and with contrast is recommended. 3. Small upper abdominal retroperitoneal lymph nodes, increased in size from 12/07/2022. Metastatic disease is not excluded. 4. Small pelvic free fluid. 5. Aortic atherosclerosis (ICD10-I70.0). Atherosclerotic narrowing of the celiac trunk.   Electronically Signed By: Newell Eke M.D. On: 05/28/2024 14:47

## 2024-07-02 NOTE — Telephone Encounter (Signed)
 erroe

## 2024-07-03 ENCOUNTER — Encounter: Payer: Self-pay | Admitting: Psychiatry

## 2024-07-03 ENCOUNTER — Telehealth (HOSPITAL_BASED_OUTPATIENT_CLINIC_OR_DEPARTMENT_OTHER): Payer: Self-pay | Admitting: *Deleted

## 2024-07-03 ENCOUNTER — Ambulatory Visit: Payer: Self-pay | Admitting: Psychiatry

## 2024-07-03 ENCOUNTER — Telehealth: Payer: Self-pay | Admitting: *Deleted

## 2024-07-03 LAB — CEA (ACCESS): CEA (CHCC): <1 ng/mL (ref 0.00–5.00)

## 2024-07-03 LAB — CA 125: Cancer Antigen (CA) 125: 267 U/mL — ABNORMAL HIGH (ref 0.0–38.1)

## 2024-07-03 LAB — CANCER ANTIGEN 19-9: CA 19-9: 27 U/mL (ref 0–35)

## 2024-07-03 NOTE — Telephone Encounter (Signed)
   Name: [name]  DOB: 1946/06/08  MRN: [id number]  Primary Cardiologist: Jerel Balding, MD  Chart reviewed as part of pre-operative protocol coverage. Because of [name] past medical history and time since last visit, she will require a follow-up in-office visit in order to better assess preoperative cardiovascular risk.  Patient is overdue for follow-up, at last office visit had markedly uncontrolled hypertension.  Pre-op covering staff: - Please schedule appointment and call patient to inform them. If patient already had an upcoming appointment within acceptable timeframe, please add pre-op clearance to the appointment notes so provider is aware. - Please contact requesting surgeon's office via preferred method (i.e, phone, fax) to inform them of need for appointment prior to surgery.  Drake Wuertz D Chesney Klimaszewski, NP  07/03/2024, 2:22 PM

## 2024-07-03 NOTE — Telephone Encounter (Signed)
   Pre-operative Risk Assessment    Patient Name: [name]  DOB: 05/19/1946 MRN: [id number]   Date of last office visit: 12/23/23 GLENDIA FERRIER, [facility] Date of next office visit: NONE   Request for Surgical Clearance    Procedure:  DIAGNOSTIC LAPAROSCOPY, POSSIBLE EXPLORATORY LAPAROTOMY, B/L SALPING-OOPHORECTOMY, DEBULKING, VULVAR Bx    Date of Surgery:  Clearance 07/31/24                                Surgeon:  DR. [name] Surgeon's Group or Practice Name:  [facility]  Phone number:  [phone] Fax number:  [phone]   Type of Clearance Requested:   - Medical  - Pharmacy:  Hold Aspirin      Type of Anesthesia:  General    Additional requests/questions:    Bonney Niels Jest   07/03/2024, 2:11 PM

## 2024-07-03 NOTE — Telephone Encounter (Signed)
 Per Dr Eldonna fax surgical optimization form to the patient's cardiology office  [facility] [place], [place] [phone])

## 2024-07-03 NOTE — Telephone Encounter (Signed)
 Attempted to call patient. No answer left a vm to call back

## 2024-07-04 NOTE — Telephone Encounter (Signed)
 Attempted to call patient for an office appointment for a pre-op clearance. No answer left a vm to call back. ----2nd attempt.

## 2024-07-05 NOTE — Telephone Encounter (Signed)
 Pt has appt 07/12/24 [name], [facility] for preop clearance.

## 2024-07-10 NOTE — Progress Notes (Addendum)
 " Cardiology Office Note   Date:  07/12/2024  ID:  [name], DOB 01/02/1946, MRN [id number] PCP: Delores Rojelio Caldron, NP  [facility] Providers Cardiologist:  Jerel Balding, MD Cardiology APP:  Lelon Glendia DASEN, PA-C     History of Present Illness [name] is a 78 y.o. female with history of HFpEF, non obstructive CAD, hypertension, hyperlipidemia, MR, PAD, and giant cell arteritis.     She was a previous patient of Ronal Ross with non cardaic chest pain. Nuclear stress test 04/22/15 with intermediate risk study, LVEF 55-65%. Heart catheterization 04/23/15 concluded mild, non-obstructive CAD with recommendations to continue aggressive preventative medical therapy. Myoview  stress test 03/22/17 LVEF 57%, low risk study. She continued having chest pain that was felt to be related to acid reflux. Last seen in office with uncontrolled hypertension and confusion of medication regimen. She was started on amlodipine  5 mg and declined labs at the visit. She was recently in the hospital 05/28/24 for back pain and leg swelling. Lower US  revealed no DVT. There was unfortunately an ovarian mass found. She has a laparotomy scheduled for removal and a visit was warranted for surgical clearance.     She presents today for surgical clearance for ovarian laparoscopy. It is unclear of the accuracy of the historian. She lives at home alone. She has historically had some atypical chest pain in the past. Today she has had a few episodes of chest pain at home and takes nitroglycerin . She says that if the first nitroglycerin  does not help, she takes another. Chest pain can be reproducible upon palpation. She has had some SOB that occurs randomly. She has fallen at home a few times, but is unable to remember how many time. She thinks that she has had syncopal episodes and that is why she is unable to remember. She does have trouble getting around and uses a walker.   ROS: All systems negative unless  otherwise indicated in HPI.   Studies Reviewed EKG Interpretation Date/Time:  Thursday July 12 2024 09:35:48 EST Ventricular Rate:  49 PR Interval:  144 QRS Duration:  82 QT Interval:  428 QTC Calculation: 386 R Axis:   -41  Text Interpretation: Sinus bradycardia Left axis deviation Minimal voltage criteria for LVH, may be normal variant ( R in aVL ) When compared with ECG of 28-May-2024 11:39, Confirmed by Teresa Fish [phone]) on 07/12/2024 9:42:16 AM    Cardiac Studies & Procedures   ______________________________________________________________________________________________ CARDIAC CATHETERIZATION  CARDIAC CATHETERIZATION 04/23/2015  Conclusion  Mild, non-obstructive CAD.  Normal LVEDP.  Continue aggressive preventive therapy.  Findings Coronary Findings Diagnostic  Dominance: Right  Left Anterior Descending The vessel exhibits minimal luminal irregularities.  Left Circumflex The vessel exhibits minimal luminal irregularities.  Right Coronary Artery There is mild the vessel.  Right Posterior Descending Artery There is mild in the vessel.  Intervention  No interventions have been documented.   STRESS TESTS  MYOCARDIAL PERFUSION IMAGING 03/22/2017  Interpretation Summary  Nuclear stress EF: 57%. No wall motion abnormalities.  There was no ST segment deviation noted during stress.  The study is normal. No perfusion defects indicative of ischemia or infarction.  This is a low risk study.  Oneil Parchment, MD   ECHOCARDIOGRAM  ECHOCARDIOGRAM COMPLETE 04/22/2015  Narrative ** *[facility]* [address] [place], [place] [address] [phone]  ------------------------------------------------------------------- Transthoracic Echocardiography  Patient:    [name], [name] MR #:       [id number] Study Date:  04/22/2015 Gender:     F Age:        71 Height:     157.5 cm Weight:     87.7 kg BSA:        2 m^2 Pt.  Status: Room:       3W19C  REFERRING    Reyes Forget, MD SONOGRAPHER  Celestia Chi ADMITTING    Niu, Xilin [id number] ORDERING     Niu, Xilin [id number] ATTENDING    Juvenal Harlene PENNER PERFORMING   [facility], Outpatient  cc:  ------------------------------------------------------------------- LV EF: 60% -   65%  ------------------------------------------------------------------- Indications:      (R07.9).  ------------------------------------------------------------------- History:   PMH:  Acquired from the patient and from the patient&'s chart.  Chest pain.  Dyspnea.  Coronary artery disease.  Risk factors:  Former tobacco use. Hypertension. Diabetes mellitus. Dyslipidemia.  ------------------------------------------------------------------- Study Conclusions  - Left ventricle: The cavity size was normal. Systolic function was normal. The estimated ejection fraction was in the range of 60% to 65%. Wall motion was normal; there were no regional wall motion abnormalities. There was an increased relative contribution of atrial contraction to ventricular filling. Doppler parameters are consistent with abnormal left ventricular relaxation (grade 1 diastolic dysfunction). - Aortic valve: Moderate focal calcification. - Tricuspid valve: There was trivial regurgitation. - Pulmonic valve: There was mild regurgitation.  Transthoracic echocardiography.  M-mode, complete 2D, spectral Doppler, and color Doppler.  Birthdate:  Patient birthdate: [birth year].  Age:  Patient is 78 yr old.  Sex:  Gender: female. BMI: 35.4 kg/m^2.  Blood pressure:     130/70  Patient status: Inpatient.  Study date:  Study date: 04/22/2015. Study time: 10:04 AM.  Location:  Bedside.  -------------------------------------------------------------------  ------------------------------------------------------------------- Left ventricle:  The cavity size was normal. Systolic function was normal. The estimated ejection  fraction was in the range of 60% to 65%. Wall motion was normal; there were no regional wall motion abnormalities. There was an increased relative contribution of atrial contraction to ventricular filling. Doppler parameters are consistent with abnormal left ventricular relaxation (grade 1 diastolic dysfunction).  ------------------------------------------------------------------- Aortic valve:   Trileaflet.  Moderate focal calcification. Mobility was not restricted.  Doppler:  Transvalvular velocity was within the normal range. There was no stenosis. There was no regurgitation.  ------------------------------------------------------------------- Aorta:  Aortic root: The aortic root was normal in size.  ------------------------------------------------------------------- Mitral valve:   Structurally normal valve.   Mobility was not restricted.  Doppler:  Transvalvular velocity was within the normal range. There was no evidence for stenosis. There was no regurgitation.    Peak gradient (D): 3 mm Hg.  ------------------------------------------------------------------- Left atrium:  The atrium was normal in size.  ------------------------------------------------------------------- Right ventricle:  The cavity size was normal. Wall thickness was normal. Systolic function was normal.  ------------------------------------------------------------------- Pulmonic valve:    Structurally normal valve.   Cusp separation was normal.  Doppler:  Transvalvular velocity was within the normal range. There was no evidence for stenosis. There was mild regurgitation.  ------------------------------------------------------------------- Tricuspid valve:   Structurally normal valve.    Doppler: Transvalvular velocity was within the normal range. There was trivial regurgitation.  ------------------------------------------------------------------- Pulmonary artery:   The main pulmonary artery was  normal-sized. Systolic pressure was within the normal range.  ------------------------------------------------------------------- Right atrium:  The atrium was normal in size.  ------------------------------------------------------------------- Pericardium:  There was no pericardial effusion.  ------------------------------------------------------------------- Systemic veins: Inferior vena cava: The vessel was normal in size.  ------------------------------------------------------------------- Measurements  Left ventricle  Value        Reference LV ID, ED, PLAX chordal        (L)     41.6  mm     43 - 52 LV ID, ES, PLAX chordal                23.5  mm     23 - 38 LV fx shortening, PLAX chordal         44    %      >=29 Stroke volume, 2D                      78    ml     --------- Stroke volume/bsa, 2D                  39    ml/m^2 --------- LV e&', lateral                         8.68  cm/s   --------- LV E/e&', lateral                       9.95         --------- LV e&', medial                          5.75  cm/s   --------- LV E/e&', medial                        15.03        --------- LV e&', average                         7.22  cm/s   --------- LV E/e&', average                       11.98        ---------  Ventricular septum                     Value        Reference IVS thickness, ED                      41.7  mm     ---------  LVOT                                   Value        Reference LVOT ID, S                             20    mm     --------- LVOT area                              3.14  cm^2   --------- LVOT ID                                20    mm     --------- LVOT peak velocity, S  105   cm/s   --------- LVOT mean velocity, S                  66.9  cm/s   --------- LVOT VTI, S                            24.7  cm     --------- LVOT peak gradient, S                  4     mm Hg  --------- Stroke volume (SV), LVOT DP             77.6  ml     --------- Stroke index (SV/bsa), LVOT DP         38.8  ml/m^2 ---------  Aorta                                  Value        Reference Aortic root ID, ED                     31    mm     --------- Ascending aorta ID, A-P, S             35    mm     ---------  Left atrium                            Value        Reference LA ID, A-P, ES                         35    mm     --------- LA ID/bsa, A-P                         1.75  cm/m^2 <=2.2 LA volume, S                           52    ml     --------- LA volume/bsa, S                       26    ml/m^2 --------- LA volume, ES, 1-p A4C                 42    ml     --------- LA volume/bsa, ES, 1-p A4C             21    ml/m^2 --------- LA volume, ES, 1-p A2C                 62    ml     --------- LA volume/bsa, ES, 1-p A2C             31    ml/m^2 ---------  Mitral valve                           Value        Reference Mitral E-wave peak velocity            86.4  cm/s   --------- Mitral A-wave peak velocity  98.7  cm/s   --------- Mitral deceleration time       (H)     257   ms     150 - 230 Mitral peak gradient, D                3     mm Hg  --------- Mitral E/A ratio, peak                 0.9          ---------  Pulmonary arteries                     Value        Reference PA pressure, S, DP                     26    mm Hg  <=30  Tricuspid valve                        Value        Reference Tricuspid regurg peak velocity         239   cm/s   --------- Tricuspid peak RV-RA gradient          23    mm Hg  ---------  Systemic veins                         Value        Reference Estimated CVP                          3     mm Hg  ---------  Right ventricle                        Value        Reference RV ID, ED, PLAX                (L)     15.1  mm     19 - 38 RV pressure, S, DP                     26    mm Hg  <=30 RV s&', lateral, S                      12.3  cm/s   ---------  Legend: (L)  and  (H)  mark  values outside specified reference range.  ------------------------------------------------------------------- Prepared and Electronically Authenticated by  Wilbert Bihari, MD 2016-09-27T12:37:02          ______________________________________________________________________________________________      Risk Assessment/Calculations   HYPERTENSION CONTROL Vitals:   07/12/24 0921 07/12/24 1030  BP: (!) 150/72 (!) 160/72    The patient's blood pressure is elevated above target today.  In order to address the patient's elevated BP: Follow up with general cardiology has been recommended. (has not taken medications this morning)          Physical Exam VS:  BP (!) 160/72 (BP Location: Left Arm)   Pulse (!) 52   Ht 5' 2.5 (1.588 m)   Wt 133 lb (60.3 kg)   SpO2 97%   BMI 23.94 kg/m        Wt Readings from Last 3 Encounters:  07/12/24 133 lb (60.3 kg)  07/02/24 137 lb  3.2 oz (62.2 kg)  05/28/24 131 lb (59.4 kg)    GEN: Well nourished, well developed in no acute distress NECK: No JVD; No carotid bruits CARDIAC: RRR, no murmurs, rubs, gallops RESPIRATORY:  Clear to auscultation without rales, wheezing or rhonchi  ABDOMEN: Soft, non-tender, non-distended EXTREMITIES:  No edema; No deformity   ASSESSMENT AND PLAN  Surgical clearance- She has had multiple episodes of chest pain and has taken her nitroglycerin . She has also had episodes of SOB. She has not had any workup since 2018. Stress test 08/02/24 was normal and low risk study with no evidence of ischemia. LVEF 70%. Severe triple vessel coronary calcifications present.  According to the Revised Cardiac Risk Index (RCRI), her Perioperative Risk of Major Cardiac Event is (%): 0.9 Her Functional Capacity in METs is: 3.97 according to the Duke Activity Status Index (DASI). Regarding ASA therapy, we recommend continuation of ASA throughout the perioperative period. However, if the surgeon feels that cessation of ASA is required  in the perioperative period, it may be stopped 5-7 days prior to surgery with a plan to resume it as soon as felt to be feasible from a surgical standpoint in the post-operative period. Per AHA/ACC guidelines, she is deemed acceptable risk for the planned procedure without additional cardiovascular testing. Will route to surgical team so they are aware.   HFpEF- Myoview  stress test 03/22/17 LVEF 57%. She has some SOB sporadically. She has difficulty walking and uses a walker. Some SOB is attributed to deconditioning. She has passed out and fallen at home multiple times. Repeat echo needed as she has not had one since 2018. Continue carvedilol  6.25 mg BID, amlodipine  5 mg, spironolactone 25 mg, and losartan  50 mg.   Non-obstructive CAD- Myoview  stress test 03/22/17 low risk study. She reports today that she has had chest pain recently and has taken her nitroglycerin  a few times. She has taken nitroglycerin  x 2 and pain medicine to stop her chest pain. She says when she wakes up form a nap that she feels better. She does get lightheaded and dizzy, but is not eating a lot as she does not have an appetite. Continue aspirin  81 mg, carvedilol  6.25 mg, atorvastatin  40 mg, Imdur  30 mg, and amlodipine  5 mg.   Hypertension- She has historically had a difficult time controlling BP. Today blood pressure elevated. She denies taking her medication this morning. Continue losartan  50 mg,  Imdur  30 mg, and amlodipine  5 mg.   HLD goal <70- Last LDL 54 03/2015. She is fasting. Repeat lipid panel today. Continue atorvastatin  40 mg.    Informed Consent   Shared Decision Making/Informed Consent The risks [chest pain, shortness of breath, cardiac arrhythmias, dizziness, blood pressure fluctuations, myocardial infarction, stroke/transient ischemic attack, nausea, vomiting, allergic reaction, radiation exposure, metallic taste sensation and life-threatening complications (estimated to be 1 in 10,000)], benefits (risk  stratification, diagnosing coronary artery disease, treatment guidance) and alternatives of a nuclear stress test were discussed in detail with Ms. Croston and she agrees to proceed.     Dispo: Follow up with APP in 2-3 months.   Signed, Tammy KATHEE Pizza, [name]  "

## 2024-07-12 ENCOUNTER — Encounter: Payer: Self-pay | Admitting: General Practice

## 2024-07-12 ENCOUNTER — Ambulatory Visit: Attending: Cardiovascular Disease

## 2024-07-12 VITALS — BP 160/72 | HR 52 | Ht 62.5 in | Wt 133.0 lb

## 2024-07-12 DIAGNOSIS — I10 Essential (primary) hypertension: Secondary | ICD-10-CM

## 2024-07-12 DIAGNOSIS — Z01818 Encounter for other preprocedural examination: Secondary | ICD-10-CM

## 2024-07-12 DIAGNOSIS — I25119 Atherosclerotic heart disease of native coronary artery with unspecified angina pectoris: Secondary | ICD-10-CM | POA: Diagnosis not present

## 2024-07-12 DIAGNOSIS — I5032 Chronic diastolic (congestive) heart failure: Secondary | ICD-10-CM | POA: Diagnosis not present

## 2024-07-12 DIAGNOSIS — E782 Mixed hyperlipidemia: Secondary | ICD-10-CM | POA: Diagnosis not present

## 2024-07-12 LAB — LIPID PANEL
Chol/HDL Ratio: 2.4 ratio (ref 0.0–4.4)
Cholesterol, Total: 85 mg/dL — ABNORMAL LOW (ref 100–199)
HDL: 35 mg/dL — ABNORMAL LOW (ref >39)
LDL Chol Calc (NIH): 32 mg/dL (ref 0–99)
Triglycerides: 90 mg/dL (ref 0–149)
VLDL Cholesterol Cal: 18 mg/dL (ref 5–40)

## 2024-07-12 NOTE — Patient Instructions (Addendum)
 Medication Instructions:  Your physician recommends that you continue on your current medications as directed. Please refer to the Current Medication list given to you today. *If you need a refill on your cardiac medications before your next appointment, please call your pharmacy*  Lab Work: TODAY-LIPIDS If you have labs (blood work) drawn today and your tests are completely normal, you will receive your results only by: MyChart Message (if you have MyChart) OR A paper copy in the mail If you have any lab test that is abnormal or we need to change your treatment, we will call you to review the results.  Testing/Procedures: Your physician has requested that you have a lexiscan  myoview . For further information please visit [email or web address] Please follow instruction sheet, as given.  Your physician has requested that you have an echocardiogram. Echocardiography is a painless test that uses sound waves to create images of your heart. It provides your doctor with information about the size and shape of your heart and how well your hearts chambers and valves are working. This procedure takes approximately one hour. There are no restrictions for this procedure. Please do NOT wear cologne, perfume, aftershave, or lotions (deodorant is allowed). Please arrive 15 minutes prior to your appointment time.  Please note: We ask at that you not bring children with you during ultrasound (echo/ vascular) testing. Due to room size and safety concerns, children are not allowed in the ultrasound rooms during exams. Our front office staff cannot provide observation of children in our lobby area while testing is being conducted. An adult accompanying a patient to their appointment will only be allowed in the ultrasound room at the discretion of the ultrasound technician under special circumstances. We apologize for any inconvenience.  Follow-Up: At [facility], you and your health needs are our  priority.  As part of our continuing mission to provide you with exceptional heart care, our providers are all part of one team.  This team includes your primary Cardiologist (physician) and Advanced Practice Providers or APPs (Physician Assistants and Nurse Practitioners) who all work together to provide you with the care you need, when you need it.  Your next appointment:   2-3 month(s)  Provider:   Jerel Balding, MD or ANY APP   We recommend signing up for the patient portal called MyChart.  Sign up information is provided on this After Visit Summary.  MyChart is used to connect with patients for Virtual Visits (Telemedicine).  Patients are able to view lab/test results, encounter notes, upcoming appointments, etc.  Non-urgent messages can be sent to your provider as well.   To learn more about what you can do with MyChart, go to [email or web address].   Other Instructions

## 2024-07-13 ENCOUNTER — Ambulatory Visit: Payer: Self-pay

## 2024-07-23 ENCOUNTER — Encounter (HOSPITAL_COMMUNITY): Payer: Self-pay

## 2024-07-24 ENCOUNTER — Telehealth: Payer: Self-pay | Admitting: Cardiovascular Disease

## 2024-07-24 NOTE — Telephone Encounter (Signed)
 GYN Oncology calling to ask patient be scheduled for stress test before procedure on 07/31/24.

## 2024-07-24 NOTE — Progress Notes (Signed)
 Anesthesia Review:  PCP: Cardiologist : Croit[place]- 07/24/2024 telephone clearance 07/12/24- Alyssa White,FNP card preop   PPM/ ICD: Device Orders: Rep Notified:  Chest x-ray : 05/28/24- 2 view  EKG : 07/12/24  Echo : 2016  Stress test: 2018  Cardiac Cath :  2016   Activity level:  Sleep Study/ CPAP : Fasting Blood Sugar :      / Checks Blood Sugar -- times a day:     DM- type Hgba1c-    Blood Thinner/ Instructions /Last Dose: ASA / Instructions/ Last Dose :    81 mg aspirin     Pt was a no show for stress test on 07/27/24.  Surgeon office aware.   At 1100am on 07/27/24 whenpt had not showed for preop appt.  Called pt .  PT at home.  PT states she has been out of town and was too tired to come for preop appt and stress test.  PT did allow for me to completed med hx via phone.  GDaughter is her poa PER PT.granddaughter was not at home with her.  PT states gdaughter at work.  Gdaughter lives in [place] per pt.  PT states she usually takes [employer] to appts unless they are appt and then granddaughter comes with her. Asked pt wy she did not call to sayshe was not coming.  No answer given.  Informed pt that preop nurse would be calling surgeon office to tell them she was a no show for preop appt and that surgery would probably be rescheduled.  Spoke with Ami at office and made her aware of above.   Gdaughter phone number is [phone].   Pt phoen number is [phone].   Also spoke with preop scheduler, Twanna Kipper and made her aware.  Secure chat sent to charge nurse, Pamila Bertrand.

## 2024-07-24 NOTE — Telephone Encounter (Signed)
 Preop clearance eval from OV on 12/18 has not been routed tor requesting surgeon's office

## 2024-07-24 NOTE — Telephone Encounter (Signed)
 Caller Everlina) is following-up on the status of patient's clearance for surgery on 07/31/24.

## 2024-07-25 ENCOUNTER — Other Ambulatory Visit: Payer: Self-pay

## 2024-07-25 ENCOUNTER — Telehealth: Payer: Self-pay | Admitting: *Deleted

## 2024-07-25 ENCOUNTER — Telehealth: Payer: Self-pay

## 2024-07-25 DIAGNOSIS — I2510 Atherosclerotic heart disease of native coronary artery without angina pectoris: Secondary | ICD-10-CM

## 2024-07-25 NOTE — Telephone Encounter (Signed)
 The patients granddaughter [name] called back and they are now able to make the nuclear stress test appointment 07/27/24 at 7:45 am. Instructions given to be NPO and avoid caffeine 12 hours prior to test.

## 2024-07-25 NOTE — Telephone Encounter (Signed)
-----   Message from Eleanor Epps, NP sent at 07/25/2024  4:07 PM EST ----- Please call the patient and let her know we may need to postpone her surgery to allow for time to get the cardiac testing done. We are reaching out to that office to see if there is any possible way this can be done before Tues and if not we will have to move her. ----- Message ----- From: Teresa Mardy NOVAK, FNP Sent: 07/24/2024   1:36 PM EST To: Eleanor JONETTA Epps, NP; Arlyne HERO Wheat, NT  Hello,  She does need at minimum the stress test prior to surgery. She has had multiple episodes of chest pain, in which she took her nitroglycerin  x2 and SOB. There will be estimated EF on stress test results. Echo can be scheduled at later date, but needs to be done. If stress test looks ok, can proceed. The stress test does not look to be scheduled yet.   Thanks, Alyssa ----- Message ----- From: [place] Arlyne HERO, NT Sent: 07/24/2024  12:39 PM EST To: Eleanor JONETTA Epps, NP; Arlyne HERO Wheat, NT;#  Good afternoon,      You saw this patient for a pre op clearance appt on 12/18. In the office note it talks about the patient needing an Echo and stress test. Does the patient need this before she can be cleared to have her surgery? Her surgery is on 07/31/24.     Rosaline Charleston Jerrald Christoper, NT [address]  [place], [place]   [address] [phone]

## 2024-07-25 NOTE — Patient Instructions (Signed)
 SURGICAL WAITING ROOM VISITATION  Patients having surgery or a procedure may have no more than 2 support people in the waiting area - these visitors may rotate.    Children ages 9 and under will not be able to visit patients in [facility] under most circumstances.   Visitors with respiratory illnesses are discouraged from visiting and should remain at home.  If the patient needs to stay at the hospital during part of their recovery, the visitor guidelines for inpatient rooms apply. Pre-op nurse will coordinate an appropriate time for 1 support person to accompany patient in pre-op.  This support person may not rotate.    Please refer to the [facility] website for the visitor guidelines for Inpatients (after your surgery is over and you are in a regular room).       Your procedure is scheduled on:  07/31/2024    Report to [facility] Main Entrance    Report to admitting at  1000 AM   Call this number if you have problems the morning of surgery [phone] Clear liquid diet the day before surgery    After Midnight you may have the following liquids until _ 0900_____ AM DAY OF SURGERY  Water Non-Citrus Juices (without pulp, NO RED-Apple, White grape, White cranberry) Black Coffee (NO MILK/CREAM OR CREAMERS, sugar ok)  Clear Tea (NO MILK/CREAM OR CREAMERS, sugar ok) regular and decaf                             Plain Jell-O (NO RED)                                           Fruit ices (not with fruit pulp, NO RED)                                     Popsicles (NO RED)                                                               Sports drinks like Gatorade (NO RED)                          If you have questions, please contact your surgeons office.       Oral Hygiene is also important to reduce your risk of infection.                                    Remember - BRUSH YOUR TEETH THE MORNING OF SURGERY WITH YOUR REGULAR TOOTHPASTE  DENTURES WILL BE  REMOVED PRIOR TO SURGERY PLEASE DO NOT APPLY Poly grip OR ADHESIVES!!!   Do NOT smoke after Midnight   Stop all vitamins and herbal supplements 7 days before surgery.   Take these medicines the morning of surgery with A SIP OF WATER:  coreg ,  Imdur    DO NOT TAKE ANY ORAL DIABETIC MEDICATIONS DAY OF YOUR SURGERY  Bring CPAP mask  and tubing day of surgery.                              You may not have any metal on your body including hair pins, jewelry, and body piercing             Do not wear make-up, lotions, powders, perfumes/cologne, or deodorant  Do not wear nail polish including gel and S&S, artificial/acrylic nails, or any other type of covering on natural nails including finger and toenails. If you have artificial nails, gel coating, etc. that needs to be removed by a nail salon please have this removed prior to surgery or surgery may need to be canceled/ delayed if the surgeon/ anesthesia feels like they are unable to be safely monitored.   Do not shave  48 hours prior to surgery.               Men may shave face and neck.   Do not bring valuables to the hospital. [facility] IS NOT             RESPONSIBLE   FOR VALUABLES.   Contacts, glasses, dentures or bridgework may not be worn into surgery.   Bring small overnight bag day of surgery.   DO NOT BRING YOUR HOME MEDICATIONS TO THE HOSPITAL. PHARMACY WILL DISPENSE MEDICATIONS LISTED ON YOUR MEDICATION LIST TO YOU DURING YOUR ADMISSION IN THE HOSPITAL!    Patients discharged on the day of surgery will not be allowed to drive home.  Someone NEEDS to stay with you for the first 24 hours after anesthesia.   Special Instructions: Bring a copy of your healthcare power of attorney and living will documents the day of surgery if you haven't scanned them before.              Please read over the following fact sheets you were given: IF YOU HAVE QUESTIONS ABOUT YOUR PRE-OP INSTRUCTIONS PLEASE CALL [phone].   If you received a  COVID test during your pre-op visit  it is requested that you wear a mask when out in public, stay away from anyone that may not be feeling well and notify your surgeon if you develop symptoms. If you test positive for Covid or have been in contact with anyone that has tested positive in the last 10 days please notify you surgeon.    [place] - Preparing for Surgery Before surgery, you can play an important role.  Because skin is not sterile, your skin needs to be as free of germs as possible.  You can reduce the number of germs on your skin by washing with CHG (chlorahexidine gluconate) soap before surgery.  CHG is an antiseptic cleaner which kills germs and bonds with the skin to continue killing germs even after washing. Please DO NOT use if you have an allergy to CHG or antibacterial soaps.  If your skin becomes reddened/irritated stop using the CHG and inform your nurse when you arrive at Short Stay. Do not shave (including legs and underarms) for at least 48 hours prior to the first CHG shower.  You may shave your face/neck.  Please follow these instructions carefully:  1.  Shower with CHG Soap the night before surgery ONLY (DO NOT USE THE SOAP THE MORNING OF SURGERY).  2.  If you choose to wash your hair, wash your hair first as usual with your normal  shampoo.  3.  After you  shampoo, rinse your hair and body thoroughly to remove the shampoo.                             4.  Use CHG as you would any other liquid soap.  You can apply chg directly to the skin and wash.  Gently with a scrungie or clean washcloth.  5.  Apply the CHG Soap to your body ONLY FROM THE NECK DOWN.   Do   not use on face/ open                           Wound or open sores. Avoid contact with eyes, ears mouth and   genitals (private parts).                       Wash face,  Genitals (private parts) with your normal soap.             6.  Wash thoroughly, paying special attention to the area where your    surgery  will be  performed.  7.  Thoroughly rinse your body with warm water from the neck down.  8.  DO NOT shower/wash with your normal soap after using and rinsing off the CHG Soap.                9.  Pat yourself dry with a clean towel.            10.  Wear clean pajamas.            11.  Place clean sheets on your bed the night of your first shower and do not  sleep with pets. Day of Surgery : Do not apply any CHG, lotions/deodorants the morning of surgery.  Please wear clean clothes to the hospital/surgery center.  FAILURE TO FOLLOW THESE INSTRUCTIONS MAY RESULT IN THE CANCELLATION OF YOUR SURGERY  PATIENT SIGNATURE_________________________________  NURSE SIGNATURE__________________________________  ________________________________________________________________________

## 2024-07-25 NOTE — Telephone Encounter (Signed)
 Spoke with patients granddaughter Wallace Molt. Offered the patient nuclear stress test appointment 07/27/2024 at 7:45 am for surgical clearance. She has just returned from [place] . The granddaughter states that they will not be able to make this appointment. The appointment has been canceled. I will contact surgical office to let them know she will not be clear from a cardiac standpoint for her upcoming surgery 07/31/24. I told the patient to contact the office to schedule nuclear stress test.

## 2024-07-25 NOTE — Telephone Encounter (Signed)
 Attempted to reach patient to relay message from provider. Left voicemail requesting call back to [phone].

## 2024-07-27 ENCOUNTER — Encounter (HOSPITAL_COMMUNITY): Payer: Self-pay

## 2024-07-27 ENCOUNTER — Telehealth: Payer: Self-pay | Admitting: *Deleted

## 2024-07-27 ENCOUNTER — Other Ambulatory Visit: Payer: Self-pay

## 2024-07-27 ENCOUNTER — Encounter (HOSPITAL_COMMUNITY)
Admission: RE | Admit: 2024-07-27 | Discharge: 2024-07-27 | Disposition: A | Discharge status: Home / Self Care | Source: Ambulatory Visit | Attending: Psychiatry | Admitting: Psychiatry

## 2024-07-27 ENCOUNTER — Ambulatory Visit (HOSPITAL_COMMUNITY): Admission: RE | Admit: 2024-07-27 | Source: Ambulatory Visit

## 2024-07-27 DIAGNOSIS — Z01818 Encounter for other preprocedural examination: Secondary | ICD-10-CM

## 2024-07-27 HISTORY — DX: Personal history of urinary calculi: Z87.442

## 2024-07-27 NOTE — Telephone Encounter (Signed)
 Attempted to reach patient to relay message from provider. Left voicemail requesting call back to [phone].

## 2024-07-27 NOTE — Telephone Encounter (Signed)
-----   Message from Eleanor Epps, NP sent at 07/27/2024  2:00 PM EST ----- Not sure if I already sent this but just in case:   Patient's surgery has now been moved to the 13th of Jan. If she does not have the cardiac stress test before then we will have to push her surgery out to a later date since cardiology has to have this to clear her for surgery.

## 2024-07-30 ENCOUNTER — Telehealth: Payer: Self-pay

## 2024-07-30 ENCOUNTER — Telehealth (HOSPITAL_COMMUNITY): Payer: Self-pay

## 2024-07-30 NOTE — Telephone Encounter (Signed)
 07/27/24 Patient NO SHOWED Myoview .  07/30/24 LVM to call office to reschedule @ 11:17. We will not continue to reach out to patient due to NO SHOW RATE 19%. Order will be removed from the WQ.  If patient calls abck we will reinstate and schedule test. Thank you.

## 2024-07-30 NOTE — Telephone Encounter (Signed)
 [name] returned a call from [name],  She is aware to call her cardiologist (number given) to reschedule the missed stress test from 1/2. Aware to schedule it for sometime this week for clearance for upcoming surgery on 08/07/24 with Dr.Newton.   Pt voiced an understanding and is aware if the stress test is not scheduled for this week, surgery may have to be rescheduled again.

## 2024-07-30 NOTE — Telephone Encounter (Signed)
 Note that there have been several staff messages about this patient behind the scenes regarding need for completion of stress test prior to surgery. The patient previously no showed for her stress test. Will need to be completed prior to clearance. [name] with oncology has called both the patient and the granddaughter and LMOM today to call the office back to get rescheduled for her stress test before surgery on 1/13.   I will also route this message to our triage team to help reinforce need for stress test prior to surgery. Triage team - note patient has memory issues so granddaughter is point of contact.

## 2024-07-31 ENCOUNTER — Telehealth (HOSPITAL_COMMUNITY): Payer: Self-pay | Admitting: *Deleted

## 2024-07-31 ENCOUNTER — Encounter (HOSPITAL_COMMUNITY): Payer: Self-pay | Admitting: *Deleted

## 2024-07-31 DIAGNOSIS — R1900 Intra-abdominal and pelvic swelling, mass and lump, unspecified site: Secondary | ICD-10-CM

## 2024-07-31 DIAGNOSIS — I2510 Atherosclerotic heart disease of native coronary artery without angina pectoris: Secondary | ICD-10-CM

## 2024-07-31 NOTE — Addendum Note (Signed)
 Addended by: TERESA FISH B on: 07/31/2024 08:54 AM   Modules accepted: Orders

## 2024-07-31 NOTE — Telephone Encounter (Signed)
Attempted to call patient regarding upcoming appointment- no answer, unable to leave a message. Letter sent with instructions to my chart.  [name]  

## 2024-08-01 ENCOUNTER — Telehealth: Payer: Self-pay | Admitting: *Deleted

## 2024-08-01 NOTE — Telephone Encounter (Signed)
 Spoke with the patient and gave date/time/instructions for the appt tomorrow. Patient verbalized understanding

## 2024-08-01 NOTE — Telephone Encounter (Addendum)
 Late entry-----LMOM for the patient to call the office back regarding an appt for tomorrow

## 2024-08-02 ENCOUNTER — Ambulatory Visit (HOSPITAL_COMMUNITY)
Admission: RE | Admit: 2024-08-02 | Discharge: 2024-08-02 | Disposition: A | Discharge status: Home / Self Care | Source: Ambulatory Visit | Attending: Cardiovascular Disease | Admitting: Cardiovascular Disease

## 2024-08-02 DIAGNOSIS — I2510 Atherosclerotic heart disease of native coronary artery without angina pectoris: Secondary | ICD-10-CM | POA: Diagnosis present

## 2024-08-02 LAB — MYOCARDIAL PERFUSION IMAGING
LV dias vol: 80 mL (ref 46–106)
LV sys vol: 24 mL
Nuc Stress EF: 70 %
Peak HR: 81 {beats}/min
Rest HR: 52 {beats}/min
Rest Nuclear Isotope Dose: 10.5 mCi
SDS: 1
SRS: 0
SSS: 1
ST Depression (mm): 0 mm
Stress Nuclear Isotope Dose: 32.3 mCi
TID: 1.04

## 2024-08-02 MED ORDER — TECHNETIUM TC 99M TETROFOSMIN IV KIT
32.3000 | PACK | Freq: Once | INTRAVENOUS | Status: AC | PRN
  Administered 2024-08-02: 32.3 via INTRAVENOUS

## 2024-08-02 MED ORDER — TECHNETIUM TC 99M TETROFOSMIN IV KIT
10.5000 | PACK | Freq: Once | INTRAVENOUS | Status: AC | PRN
  Administered 2024-08-02: 10.5 via INTRAVENOUS

## 2024-08-02 MED ORDER — REGADENOSON 0.4 MG/5ML IV SOLN
INTRAVENOUS | Status: AC
  Filled 2024-08-02: qty 5

## 2024-08-02 MED ORDER — REGADENOSON 0.4 MG/5ML IV SOLN
0.4000 mg | Freq: Once | INTRAVENOUS | Status: AC
  Administered 2024-08-02: 0.4 mg via INTRAVENOUS

## 2024-08-03 ENCOUNTER — Encounter (HOSPITAL_COMMUNITY): Payer: Self-pay

## 2024-08-03 NOTE — Patient Instructions (Addendum)
 SURGICAL WAITING ROOM VISITATION   Patients having surgery or a procedure may have no more than 2 support people in the waiting area - these visitors may rotate.     Children ages 41 and under will not be able to visit patients in [facility] under most circumstances.    Visitors with respiratory illnesses are discouraged from visiting and should remain at home.   If the patient needs to stay at the hospital during part of their recovery, the visitor guidelines for inpatient rooms apply. Pre-op nurse will coordinate an appropriate time for 1 support person to accompany patient in pre-op.  This support person may not rotate.    Please refer to the [facility] website for the visitor guidelines for Inpatients (after your surgery is over and you are in a regular room).                    Your procedure is scheduled on:  Tuesday, Jan. 13, 2026               Report to [facility] Main Entrance               Report to admitting at  1000 AM               Call this number if you have problems the morning of surgery [phone]  Clear liquid diet the day before surgery until 0900 AM DAY OF SURGERY   Water Non-Citrus Juices (without pulp, NO RED-Apple, White grape, White cranberry) Black Coffee (NO MILK/CREAM OR CREAMERS, sugar ok)  Clear Tea (NO MILK/CREAM OR CREAMERS, sugar ok) regular and decaf                             Plain Jell-O (NO RED)                                           Fruit ices (not with fruit pulp, NO RED)                                     Popsicles (NO RED)                                                               Sports drinks like Gatorade (NO RED)                If you have questions, please contact your surgeons office.    Oral Hygiene is also important to reduce your risk of infection.                                    Remember - BRUSH YOUR TEETH THE MORNING OF SURGERY WITH YOUR REGULAR TOOTHPASTE   DENTURES WILL BE REMOVED PRIOR  TO SURGERY PLEASE DO NOT APPLY Poly grip OR ADHESIVES!!!               Do NOT smoke after Midnight  Stop all vitamins and herbal supplements 7 days before surgery.               Take these medicines the morning of surgery with A SIP OF WATER:   Carvedilol  Isosorbide                               You may not have any metal on your body including hair pins, jewelry, and body piercing              Do not wear make-up, lotions, powders, perfumes/cologne, or deodorant   Do not wear nail polish including gel and S&S, artificial/acrylic nails, or any other type of covering on natural nails including finger and toenails. If you have artificial nails, gel coating, etc. that needs to be removed by a nail salon please have this removed prior to surgery or surgery may need to be canceled/ delayed if the surgeon/ anesthesia feels like they are unable to be safely monitored.    Do not shave  48 hours prior to surgery.                Do not bring valuables to the hospital. [facility] IS NOT             RESPONSIBLE   FOR VALUABLES.               Contacts, glasses, dentures or bridgework may not be worn into surgery.               Bring small overnight bag day of surgery.    DO NOT BRING YOUR HOME MEDICATIONS TO THE HOSPITAL. PHARMACY WILL DISPENSE MEDICATIONS LISTED ON YOUR MEDICATION LIST TO YOU DURING YOUR ADMISSION IN THE HOSPITAL!                          Patients discharged on the day of surgery will not be allowed to drive home.  Someone NEEDS to stay with you for the first 24 hours after anesthesia.               Special Instructions: Bring a copy of your healthcare power of attorney and living will documents the day of surgery if you haven't scanned them before.               Please read over the following fact sheets you were given: IF YOU HAVE QUESTIONS ABOUT YOUR PRE-OP INSTRUCTIONS PLEASE CALL [phone].     If you received a COVID test during your pre-op visit  it is  requested that you wear a mask when out in public, stay away from anyone that may not be feeling well and notify your surgeon if you develop symptoms. If you test positive for Covid or have been in contact with anyone that has tested positive in the last 10 days please notify you surgeon.     [place] - Preparing for Surgery Before surgery, you can play an important role.  Because skin is not sterile, your skin needs to be as free of germs as possible.  You can reduce the number of germs on your skin by washing with CHG (chlorahexidine gluconate) soap before surgery.  CHG is an antiseptic cleaner which kills germs and bonds with the skin to continue killing germs even after washing. Please DO NOT use if you have an allergy to CHG or antibacterial soaps.  If your  skin becomes reddened/irritated stop using the CHG and inform your nurse when you arrive at Short Stay. Do not shave (including legs and underarms) for at least 48 hours prior to the first CHG shower.  You may shave your face/neck.   Please follow these instructions carefully:             1.  Shower with CHG Soap the night before surgery ONLY (DO NOT USE THE SOAP THE MORNING OF SURGERY).             2.  If you choose to wash your hair, wash your hair first as usual with your normal  shampoo.             3.  After you shampoo, rinse your hair and body thoroughly to remove the shampoo.                                           4.  Use CHG as you would any other liquid soap.  You can apply chg directly to the skin and wash.  Gently with a scrungie or clean washcloth.             5.  Apply the CHG Soap to your body ONLY FROM THE NECK DOWN.   Do                not use on face/ open                           Wound or open sores. Avoid contact with eyes, ears mouth and                        genitals (private parts).                       Wash face,  Genitals (private parts) with your normal soap.             6.  Wash thoroughly, paying special  attention to the area where your                                     surgery  will be performed.             7.  Thoroughly rinse your body with warm water from the neck down.             8.  DO NOT shower/wash with your normal soap after using and rinsing off the CHG Soap.                9.  Pat yourself dry with a clean towel.            10.  Wear clean pajamas.            11.  Place clean sheets on your bed the night of your first shower and do not  sleep with pets. Day of Surgery : Do not apply any CHG, lotions/deodorants the morning of surgery.  Please wear clean clothes to the hospital/surgery center.   FAILURE TO FOLLOW THESE INSTRUCTIONS MAY RESULT IN THE CANCELLATION OF YOUR SURGERY   PATIENT SIGNATURE_________________________________   NURSE SIGNATURE__________________________________   ________________________________________________________________________

## 2024-08-03 NOTE — Telephone Encounter (Signed)
 Received cardio clearance

## 2024-08-06 ENCOUNTER — Telehealth: Payer: Self-pay | Admitting: *Deleted

## 2024-08-06 ENCOUNTER — Encounter (HOSPITAL_COMMUNITY): Payer: Self-pay

## 2024-08-06 ENCOUNTER — Encounter (HOSPITAL_COMMUNITY)
Admission: RE | Admit: 2024-08-06 | Discharge: 2024-08-06 | Disposition: A | Discharge status: Home / Self Care | Source: Ambulatory Visit | Attending: Psychiatry | Admitting: Psychiatry

## 2024-08-06 ENCOUNTER — Telehealth: Payer: Self-pay

## 2024-08-06 DIAGNOSIS — Z01812 Encounter for preprocedural laboratory examination: Secondary | ICD-10-CM | POA: Insufficient documentation

## 2024-08-06 DIAGNOSIS — R1900 Intra-abdominal and pelvic swelling, mass and lump, unspecified site: Secondary | ICD-10-CM | POA: Insufficient documentation

## 2024-08-06 DIAGNOSIS — Z01818 Encounter for other preprocedural examination: Secondary | ICD-10-CM

## 2024-08-06 LAB — COMPREHENSIVE METABOLIC PANEL WITH GFR
ALT: 20 U/L (ref 0–44)
AST: 25 U/L (ref 15–41)
Albumin: 3.7 g/dL (ref 3.5–5.0)
Alkaline Phosphatase: 68 U/L (ref 38–126)
Anion gap: 6 (ref 5–15)
BUN: 14 mg/dL (ref 8–23)
CO2: 29 mmol/L (ref 22–32)
Calcium: 10.2 mg/dL (ref 8.9–10.3)
Chloride: 104 mmol/L (ref 98–111)
Creatinine, Ser: 1.23 mg/dL — ABNORMAL HIGH (ref 0.44–1.00)
GFR, Estimated: 44 mL/min — ABNORMAL LOW
Glucose, Bld: 116 mg/dL — ABNORMAL HIGH (ref 70–99)
Potassium: 4.9 mmol/L (ref 3.5–5.1)
Sodium: 140 mmol/L (ref 135–145)
Total Bilirubin: 0.8 mg/dL (ref 0.0–1.2)
Total Protein: 7.9 g/dL (ref 6.5–8.1)

## 2024-08-06 LAB — CBC
HCT: 32.6 % — ABNORMAL LOW (ref 36.0–46.0)
Hemoglobin: 10.1 g/dL — ABNORMAL LOW (ref 12.0–15.0)
MCH: 29.2 pg (ref 26.0–34.0)
MCHC: 31 g/dL (ref 30.0–36.0)
MCV: 94.2 fL (ref 80.0–100.0)
Platelets: 204 K/uL (ref 150–400)
RBC: 3.46 MIL/uL — ABNORMAL LOW (ref 3.87–5.11)
RDW: 12.5 % (ref 11.5–15.5)
WBC: 4.4 K/uL (ref 4.0–10.5)
nRBC: 0 % (ref 0.0–0.2)

## 2024-08-06 LAB — GLUCOSE, CAPILLARY: Glucose-Capillary: 117 mg/dL — ABNORMAL HIGH (ref 70–99)

## 2024-08-06 LAB — HEMOGLOBIN A1C
Hgb A1c MFr Bld: 5.9 % — ABNORMAL HIGH (ref 4.8–5.6)
Mean Plasma Glucose: 122.63 mg/dL

## 2024-08-06 NOTE — Progress Notes (Signed)
 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  Patient follows with Cardiology for hx of nonobstructive CAD and HFpEF. Last seen on 12/18 and she reported having chest pain and taking nitroglycerin . It was advised she undergo stress testing which was done on 08/02/24 and was low risk. Echo was also ordered but not done however EF estimated to be 70%. She was cleared for surgery:  Surgical clearance- She has had multiple episodes of chest pain and has taken her nitroglycerin . She has also had episodes of SOB. She has not had any workup since 2018. Stress test 08/02/24 was normal and low risk study with no evidence of ischemia. LVEF 70%. Severe triple vessel coronary calcifications present.  According to the Revised Cardiac Risk Index (RCRI), her Perioperative Risk of Major Cardiac Event is (%): 0.9 Her Functional Capacity in METs is: 3.97 according to the [facility] Activity Status Index (DASI).   VS:     08/06/2024    8:19 AM 07/12/2024   10:30 AM 07/12/2024    9:21 AM  Vitals with BMI  Height 5' 2  5' 2.5  Weight 126 lbs 10 oz  133 lbs  BMI 23.15  23.92  Systolic 139 160 849  Diastolic 70 72 72  Pulse 52  52     PROVIDERS: Delores Rojelio Caldron, NP   LABS: Labs reviewed: Acceptable for surgery. Kidney function slightly worse. Anemia stable (all labs ordered are listed, but only abnormal results are displayed)  Labs Reviewed - No data to display   CT chest/abd/pelvis  05/28/24:  IMPRESSION: 1. No evidence of acute aortic syndrome. 2. Complex cystic and solid mass in the central anatomic pelvis, worrisome for ovarian cancer. Nonemergent MR pelvis without and with contrast is recommended. 3. Small upper abdominal retroperitoneal lymph nodes, increased in size from 12/07/2022. Metastatic disease is not excluded. 4. Small pelvic free fluid. 5. Aortic atherosclerosis (ICD10-I70.0). Atherosclerotic narrowing of the celiac trunk.    EKG 07/12/24:  Sinus bradycardia LAD Minimal voltage criteria for LVH, may be normal variant   Lexiscan  stress test 08/02/24:    The study is normal. The study is low risk.   No ST deviation was noted.   LV perfusion is normal. There is no evidence of ischemia. There is no evidence of infarction.   Left ventricular function is normal. Nuclear stress EF: 70%. The left ventricular ejection fraction is hyperdynamic (>65%). End diastolic cavity size is normal. End systolic cavity size is normal.   CT images were obtained for attenuation correction and were examined for the presence of coronary calcium  when appropriate.   Coronary calcium  was present on the attenuation correction CT images. Severe coronary calcifications were present. Coronary calcifications were present in the left anterior descending artery, left circumflex artery and right coronary artery distribution(s).  Past Medical History:  Diagnosis Date   Arthritis    Atypical face pain    CAD (coronary artery  disease)    Catheterization 2006, mild two-vessel nonobstructive disease   CKD (chronic kidney disease), stage III (HCC)    Ejection fraction    EF 60%, echo, October, 2010,   Fluid overload    Foot pain, right    November, 2012   Headache(784.0)    History of kidney stones    Hyperlipidemia    Hypertension    Mitral regurgitation    Mild, echo, October, 2010   Overweight(278.02)    PAD (peripheral artery disease)    Dr. Harvey,  Bilateral superficial  femoral artery occlusions with one vessel runoff via the peroneal artery   Shortness of breath    Sleep apnea    CPAP compliance ???   Urinary incontinence    UTI (lower urinary tract infection)     Past Surgical History:  Procedure Laterality Date   ABDOMINAL HYSTERECTOMY     Due to bleeding, dysmenorrhea   ARTERY BIOPSY  12/27/2011   Procedure: BIOPSY TEMPORAL ARTERY;  Surgeon: Carlin FORBES Harvey, MD;  [facility];  Service: Vascular;  Laterality: Left;   CARDIAC CATHETERIZATION N/A 04/23/2015   Procedure: Left Heart Cath and Coronary Angiography;  Surgeon: Candyce GORMAN Reek, MD;  Location: [facility] INVASIVE CV LAB;  Service: Cardiovascular;  Laterality: N/A;   EYE SURGERY Bilateral 12/11/2010   Cataract   REVERSE SHOULDER ARTHROPLASTY Right 09/18/2015   Procedure: REVERSE SHOULDER ARTHROPLASTY;  Surgeon: Eva Herring, MD;  Location: [facility] OR;  Service: Orthopedics;  Laterality: Right;  Right reverse total shoulder arthroplasty   REVERSE TOTAL SHOULDER ARTHROPLASTY Right 09/18/2015   SHOULDER ARTHROSCOPY Right 02/24/2015    MEDICATIONS:  aspirin  EC 81 MG tablet   atorvastatin  (LIPITOR) 40 MG tablet   bisacodyl  (DULCOLAX) 5 MG EC tablet   carvedilol  (COREG ) 6.25 MG tablet   Cholecalciferol (VITAMIN D3) 5000 units TABS   citalopram  (CELEXA ) 20 MG tablet   gabapentin  (NEURONTIN ) 100 MG capsule   HYDROcodone -acetaminophen  (NORCO) 10-325 MG tablet   isosorbide  mononitrate (IMDUR ) 30 MG 24 hr tablet   Lidocaine -Menthol  (LIDOPRO EX)   losartan  (COZAAR ) 50 MG tablet   NITROSTAT  0.4 MG SL tablet   oxybutynin (DITROPAN XL) 15 MG 24 hr tablet   polyethylene glycol (MIRALAX ) 17 g packet   senna-docusate (SENOKOT-S) 8.6-50 MG tablet   spironolactone (ALDACTONE) 25 MG tablet   traZODone  (DESYREL ) 100 MG tablet   No current facility-administered medications for this encounter.          "

## 2024-08-06 NOTE — Telephone Encounter (Signed)
 Our office received office notes from [facility] regarding Ms.Banners recent evaluation.   Per the office note she has been surgically cleared for the planned procedure with Dr.Newton on 08/07/24.   Records given to MD and nurse practitioner for review.

## 2024-08-06 NOTE — Telephone Encounter (Signed)
 Attempted to reach patient for pre-op call,. Left voicemail requesting call back to [phone].

## 2024-08-06 NOTE — Telephone Encounter (Signed)
 Telephone call to check on pre-operative status.  Patient compliant with pre-operative instructions.  Reinforced nothing to eat after midnight. Clear liquids until 0845. Patient to arrive at 0945.  No questions or concerns voiced.  Instructed to call for any needs.

## 2024-08-07 ENCOUNTER — Inpatient Hospital Stay (HOSPITAL_COMMUNITY): Payer: Self-pay | Admitting: Medical

## 2024-08-07 ENCOUNTER — Inpatient Hospital Stay (HOSPITAL_COMMUNITY)
Admission: RE | Admit: 2024-08-07 | Discharge: 2024-08-16 | DRG: 737 | Disposition: A | Discharge status: Skilled Nursing Facility | Attending: Psychiatry | Admitting: Psychiatry

## 2024-08-07 ENCOUNTER — Inpatient Hospital Stay (HOSPITAL_COMMUNITY): Admitting: Anesthesiology

## 2024-08-07 ENCOUNTER — Encounter (HOSPITAL_COMMUNITY): Payer: Self-pay | Admitting: Psychiatry

## 2024-08-07 ENCOUNTER — Encounter (HOSPITAL_COMMUNITY)
Admission: RE | Disposition: A | Discharge status: Skilled Nursing Facility | Payer: Self-pay | Source: Home / Self Care | Attending: Psychiatry

## 2024-08-07 ENCOUNTER — Other Ambulatory Visit: Payer: Self-pay

## 2024-08-07 DIAGNOSIS — Z79899 Other long term (current) drug therapy: Secondary | ICD-10-CM | POA: Diagnosis not present

## 2024-08-07 DIAGNOSIS — Z7901 Long term (current) use of anticoagulants: Secondary | ICD-10-CM

## 2024-08-07 DIAGNOSIS — N939 Abnormal uterine and vaginal bleeding, unspecified: Secondary | ICD-10-CM | POA: Diagnosis not present

## 2024-08-07 DIAGNOSIS — N736 Female pelvic peritoneal adhesions (postinfective): Secondary | ICD-10-CM | POA: Diagnosis present

## 2024-08-07 DIAGNOSIS — I2510 Atherosclerotic heart disease of native coronary artery without angina pectoris: Secondary | ICD-10-CM | POA: Diagnosis present

## 2024-08-07 DIAGNOSIS — I340 Nonrheumatic mitral (valve) insufficiency: Secondary | ICD-10-CM | POA: Diagnosis present

## 2024-08-07 DIAGNOSIS — Z96611 Presence of right artificial shoulder joint: Secondary | ICD-10-CM | POA: Diagnosis present

## 2024-08-07 DIAGNOSIS — R1900 Intra-abdominal and pelvic swelling, mass and lump, unspecified site: Secondary | ICD-10-CM

## 2024-08-07 DIAGNOSIS — F32A Depression, unspecified: Secondary | ICD-10-CM | POA: Diagnosis present

## 2024-08-07 DIAGNOSIS — N9089 Other specified noninflammatory disorders of vulva and perineum: Secondary | ICD-10-CM | POA: Diagnosis not present

## 2024-08-07 DIAGNOSIS — K660 Peritoneal adhesions (postprocedural) (postinfection): Secondary | ICD-10-CM | POA: Diagnosis present

## 2024-08-07 DIAGNOSIS — Z91040 Latex allergy status: Secondary | ICD-10-CM | POA: Diagnosis not present

## 2024-08-07 DIAGNOSIS — I129 Hypertensive chronic kidney disease with stage 1 through stage 4 chronic kidney disease, or unspecified chronic kidney disease: Secondary | ICD-10-CM | POA: Diagnosis present

## 2024-08-07 DIAGNOSIS — N183 Chronic kidney disease, stage 3 unspecified: Secondary | ICD-10-CM | POA: Diagnosis present

## 2024-08-07 DIAGNOSIS — R87622 Low grade squamous intraepithelial lesion on cytologic smear of vagina (LGSIL): Secondary | ICD-10-CM | POA: Diagnosis present

## 2024-08-07 DIAGNOSIS — E119 Type 2 diabetes mellitus without complications: Secondary | ICD-10-CM | POA: Diagnosis present

## 2024-08-07 DIAGNOSIS — N9971 Accidental puncture and laceration of a genitourinary system organ or structure during a genitourinary system procedure: Secondary | ICD-10-CM | POA: Diagnosis not present

## 2024-08-07 DIAGNOSIS — C561 Malignant neoplasm of right ovary: Principal | ICD-10-CM | POA: Diagnosis present

## 2024-08-07 DIAGNOSIS — G4730 Sleep apnea, unspecified: Secondary | ICD-10-CM | POA: Diagnosis present

## 2024-08-07 DIAGNOSIS — Z880 Allergy status to penicillin: Secondary | ICD-10-CM

## 2024-08-07 DIAGNOSIS — E785 Hyperlipidemia, unspecified: Secondary | ICD-10-CM | POA: Diagnosis present

## 2024-08-07 DIAGNOSIS — Z5331 Laparoscopic surgical procedure converted to open procedure: Secondary | ICD-10-CM

## 2024-08-07 DIAGNOSIS — Z7982 Long term (current) use of aspirin: Secondary | ICD-10-CM | POA: Diagnosis not present

## 2024-08-07 DIAGNOSIS — I739 Peripheral vascular disease, unspecified: Secondary | ICD-10-CM | POA: Diagnosis present

## 2024-08-07 DIAGNOSIS — I10 Essential (primary) hypertension: Secondary | ICD-10-CM

## 2024-08-07 DIAGNOSIS — I509 Heart failure, unspecified: Secondary | ICD-10-CM

## 2024-08-07 DIAGNOSIS — D62 Acute posthemorrhagic anemia: Secondary | ICD-10-CM | POA: Diagnosis not present

## 2024-08-07 DIAGNOSIS — Z87891 Personal history of nicotine dependence: Secondary | ICD-10-CM | POA: Diagnosis not present

## 2024-08-07 DIAGNOSIS — Z01818 Encounter for other preprocedural examination: Secondary | ICD-10-CM

## 2024-08-07 DIAGNOSIS — R971 Elevated cancer antigen 125 [CA 125]: Secondary | ICD-10-CM | POA: Diagnosis present

## 2024-08-07 DIAGNOSIS — R630 Anorexia: Principal | ICD-10-CM

## 2024-08-07 DIAGNOSIS — I130 Hypertensive heart and chronic kidney disease with heart failure and stage 1 through stage 4 chronic kidney disease, or unspecified chronic kidney disease: Secondary | ICD-10-CM | POA: Diagnosis not present

## 2024-08-07 DIAGNOSIS — C775 Secondary and unspecified malignant neoplasm of intrapelvic lymph nodes: Secondary | ICD-10-CM | POA: Diagnosis present

## 2024-08-07 HISTORY — PX: PARTIAL COLECTOMY: SHX5273

## 2024-08-07 HISTORY — PX: LAPAROSCOPY: SHX197

## 2024-08-07 HISTORY — PX: VULVA /PERINEUM BIOPSY: SHX319

## 2024-08-07 HISTORY — PX: BOWEL RESECTION: SHX1257

## 2024-08-07 HISTORY — PX: SALPINGOOPHORECTOMY: SHX82

## 2024-08-07 LAB — GLUCOSE, CAPILLARY: Glucose-Capillary: 131 mg/dL — ABNORMAL HIGH (ref 70–99)

## 2024-08-07 MED ORDER — ONDANSETRON HCL 4 MG/2ML IJ SOLN
INTRAMUSCULAR | Status: DC | PRN
  Administered 2024-08-07: 4 mg via INTRAVENOUS

## 2024-08-07 MED ORDER — ROCURONIUM BROMIDE 10 MG/ML (PF) SYRINGE
PREFILLED_SYRINGE | INTRAVENOUS | Status: DC | PRN
  Administered 2024-08-07: 15 mg via INTRAVENOUS
  Administered 2024-08-07: 35 mg via INTRAVENOUS

## 2024-08-07 MED ORDER — ORAL CARE MOUTH RINSE: 15.0000 mL | Freq: Once | OROMUCOSAL | Status: AC

## 2024-08-07 MED ORDER — KETAMINE HCL 50 MG/5ML IJ SOSY
PREFILLED_SYRINGE | INTRAMUSCULAR | Status: AC
  Filled 2024-08-07: qty 5

## 2024-08-07 MED ORDER — BUPIVACAINE LIPOSOME 1.3 % IJ SUSP
INTRAMUSCULAR | Status: DC | PRN
  Administered 2024-08-07: 20 mL

## 2024-08-07 MED ORDER — LACTATED RINGERS IV SOLN: INTRAVENOUS | Status: DC

## 2024-08-07 MED ORDER — PROPOFOL 10 MG/ML IV BOLUS
INTRAVENOUS | Status: AC
  Filled 2024-08-07: qty 20

## 2024-08-07 MED ORDER — FENTANYL CITRATE (PF) 100 MCG/2ML IJ SOLN
INTRAMUSCULAR | Status: DC | PRN
  Administered 2024-08-07 (×5): 50 ug via INTRAVENOUS

## 2024-08-07 MED ORDER — DEXAMETHASONE SOD PHOSPHATE PF 10 MG/ML IJ SOLN
4.0000 mg | INTRAMUSCULAR | Status: AC
  Administered 2024-08-07: 4 mg via INTRAVENOUS

## 2024-08-07 MED ORDER — HEPARIN SODIUM (PORCINE) 5000 UNIT/ML IJ SOLN
5000.0000 [IU] | INTRAMUSCULAR | Status: AC
  Administered 2024-08-07: 5000 [IU] via SUBCUTANEOUS
  Filled 2024-08-07: qty 1

## 2024-08-07 MED ORDER — SODIUM CHLORIDE 0.45 % IV SOLN: INTRAVENOUS | Status: AC

## 2024-08-07 MED ORDER — CHLORHEXIDINE GLUCONATE 0.12 % MT SOLN
15.0000 mL | Freq: Once | OROMUCOSAL | Status: AC
  Administered 2024-08-07: 15 mL via OROMUCOSAL

## 2024-08-07 MED ORDER — FENTANYL CITRATE (PF) 50 MCG/ML IJ SOSY
PREFILLED_SYRINGE | INTRAMUSCULAR | Status: AC
  Filled 2024-08-07: qty 2

## 2024-08-07 MED ORDER — BUPIVACAINE LIPOSOME 1.3 % IJ SUSP
INTRAMUSCULAR | Status: AC
  Filled 2024-08-07: qty 20

## 2024-08-07 MED ORDER — LIDOCAINE HCL (CARDIAC) PF 100 MG/5ML IV SOSY
PREFILLED_SYRINGE | INTRAVENOUS | Status: DC | PRN
  Administered 2024-08-07: 60 mg via INTRAVENOUS

## 2024-08-07 MED ORDER — SODIUM CHLORIDE (PF) 0.9 % IJ SOLN
INTRAMUSCULAR | Status: AC
  Filled 2024-08-07: qty 50

## 2024-08-07 MED ORDER — SUGAMMADEX SODIUM 200 MG/2ML IV SOLN
INTRAVENOUS | Status: AC
  Filled 2024-08-07: qty 2

## 2024-08-07 MED ORDER — FENTANYL CITRATE (PF) 50 MCG/ML IJ SOSY
25.0000 ug | PREFILLED_SYRINGE | INTRAMUSCULAR | Status: DC | PRN
  Administered 2024-08-07 (×2): 50 ug via INTRAVENOUS

## 2024-08-07 MED ORDER — ROCURONIUM BROMIDE 10 MG/ML (PF) SYRINGE
PREFILLED_SYRINGE | INTRAVENOUS | Status: AC
  Filled 2024-08-07: qty 10

## 2024-08-07 MED ORDER — ACETAMINOPHEN 500 MG PO TABS
1000.0000 mg | ORAL_TABLET | ORAL | Status: AC
  Administered 2024-08-07: 1000 mg via ORAL
  Filled 2024-08-07: qty 2

## 2024-08-07 MED ORDER — EPHEDRINE SULFATE (PRESSORS) 25 MG/5ML IV SOSY
PREFILLED_SYRINGE | INTRAVENOUS | Status: DC | PRN
  Administered 2024-08-07: 5 mg via INTRAVENOUS
  Administered 2024-08-07: 7.5 mg via INTRAVENOUS

## 2024-08-07 MED ORDER — CEFAZOLIN SODIUM 1 G IJ SOLR
INTRAMUSCULAR | Status: AC
  Filled 2024-08-07: qty 10

## 2024-08-07 MED ORDER — HYDRALAZINE HCL 20 MG/ML IJ SOLN
10.0000 mg | Freq: Once | INTRAMUSCULAR | Status: DC | PRN
  Administered 2024-08-07: 10 mg via INTRAVENOUS

## 2024-08-07 MED ORDER — HYDRALAZINE HCL 20 MG/ML IJ SOLN
INTRAMUSCULAR | Status: AC
  Filled 2024-08-07: qty 1

## 2024-08-07 MED ORDER — BUPIVACAINE HCL 0.25 % IJ SOLN
INTRAMUSCULAR | Status: DC | PRN
  Administered 2024-08-07: 15 mL

## 2024-08-07 MED ORDER — FENTANYL CITRATE (PF) 100 MCG/2ML IJ SOLN
INTRAMUSCULAR | Status: AC
  Filled 2024-08-07: qty 2

## 2024-08-07 MED ORDER — ACETAMINOPHEN 10 MG/ML IV SOLN
INTRAVENOUS | Status: DC | PRN
  Administered 2024-08-07: 1000 mg via INTRAVENOUS

## 2024-08-07 MED ORDER — ALBUMIN HUMAN 5 % IV SOLN: INTRAVENOUS | Status: DC | PRN

## 2024-08-07 MED ORDER — POVIDONE-IODINE 10 % EX SWAB
2.0000 | Freq: Once | CUTANEOUS | Status: AC
  Administered 2024-08-07: 2 via TOPICAL

## 2024-08-07 MED ORDER — BUPIVACAINE HCL (PF) 0.25 % IJ SOLN
INTRAMUSCULAR | Status: DC | PRN
  Administered 2024-08-07: 10 mL

## 2024-08-07 MED ORDER — LIDOCAINE HCL (PF) 2 % IJ SOLN
INTRAMUSCULAR | Status: AC
  Filled 2024-08-07: qty 5

## 2024-08-07 MED ORDER — ACETAMINOPHEN 10 MG/ML IV SOLN
INTRAVENOUS | Status: AC
  Filled 2024-08-07: qty 100

## 2024-08-07 MED ORDER — BUPIVACAINE HCL (PF) 0.25 % IJ SOLN
INTRAMUSCULAR | Status: AC
  Filled 2024-08-07: qty 30

## 2024-08-07 MED ORDER — PROPOFOL 10 MG/ML IV BOLUS
INTRAVENOUS | Status: DC | PRN
  Administered 2024-08-07: 80 mg via INTRAVENOUS

## 2024-08-07 MED ORDER — LABETALOL HCL 5 MG/ML IV SOLN: 5.0000 mg | INTRAVENOUS | Status: DC | PRN

## 2024-08-07 MED ORDER — ONDANSETRON HCL 4 MG/2ML IJ SOLN: 4.0000 mg | Freq: Once | INTRAMUSCULAR | Status: DC | PRN

## 2024-08-07 MED ORDER — SUGAMMADEX SODIUM 200 MG/2ML IV SOLN
INTRAVENOUS | Status: DC | PRN
  Administered 2024-08-07 (×2): 200 mg via INTRAVENOUS

## 2024-08-07 MED ORDER — DEXAMETHASONE SOD PHOSPHATE PF 10 MG/ML IJ SOLN
INTRAMUSCULAR | Status: AC
  Filled 2024-08-07: qty 1

## 2024-08-07 MED ORDER — PHENYLEPHRINE 80 MCG/ML (10ML) SYRINGE FOR IV PUSH (FOR BLOOD PRESSURE SUPPORT)
PREFILLED_SYRINGE | INTRAVENOUS | Status: DC | PRN
  Administered 2024-08-07: 40 ug via INTRAVENOUS

## 2024-08-07 MED ORDER — CEFAZOLIN SODIUM-DEXTROSE 2-4 GM/100ML-% IV SOLN
2.0000 g | INTRAVENOUS | Status: AC
  Administered 2024-08-07 (×2): 2 g via INTRAVENOUS
  Filled 2024-08-07: qty 100

## 2024-08-07 MED ORDER — PHENYLEPHRINE HCL (PRESSORS) 10 MG/ML IV SOLN
INTRAVENOUS | Status: AC
  Filled 2024-08-07: qty 1

## 2024-08-07 MED ORDER — EPHEDRINE 5 MG/ML INJ
INTRAVENOUS | Status: AC
  Filled 2024-08-07: qty 5

## 2024-08-07 MED ORDER — METRONIDAZOLE 500 MG/100ML IV SOLN
INTRAVENOUS | Status: AC
  Filled 2024-08-07: qty 100

## 2024-08-07 MED ORDER — METRONIDAZOLE 500 MG/100ML IV SOLN
INTRAVENOUS | Status: DC | PRN
  Administered 2024-08-07: 500 mg via INTRAVENOUS

## 2024-08-07 MED ORDER — ONDANSETRON HCL 4 MG/2ML IJ SOLN
INTRAMUSCULAR | Status: AC
  Filled 2024-08-07: qty 2

## 2024-08-07 MED ORDER — SILVER NITRATE-POT NITRATE 75-25 % EX MISC
CUTANEOUS | Status: AC
  Filled 2024-08-07: qty 10

## 2024-08-07 NOTE — Anesthesia Preprocedure Evaluation (Addendum)
"                                    Anesthesia Evaluation  Patient identified by MRN, date of birth, ID band Patient awake    Reviewed: Allergy & Precautions, NPO status , Patient's Chart, lab work & pertinent test results, reviewed documented beta blocker date and time   Airway Mallampati: II  TM Distance: >3 FB Neck ROM: Full    Dental  (+) Teeth Intact, Dental Advisory Given   Pulmonary sleep apnea , former smoker   Pulmonary exam normal breath sounds clear to auscultation       Cardiovascular hypertension, Pt. on home beta blockers and Pt. on medications + CAD, + Peripheral Vascular Disease and +CHF  Normal cardiovascular exam+ Valvular Problems/Murmurs MR  Rhythm:Regular Rate:Normal  Echo 08/02/2024: The study is normal. The study is low risk. No ST deviation was noted. LV perfusion is normal. There is no evidence of ischemia. There is no evidence of infarction. Left ventricular function is normal. Nuclear stress EF: 70%. The left ventricular ejection fraction is hyperdynamic (>65%). End diastolic cavity size is normal. End systolic cavity size is normal. CT images were obtained for attenuation correction and were examined for the presence of coronary calcium  when appropriate. Coronary calcium  was present on the attenuation correction CT images. Severe coronary calcifications were present. Coronary calcifications were present in the left anterior descending artery, left circumflex artery and right coronary artery distribution(s).    Neuro/Psych  Headaches PSYCHIATRIC DISORDERS  Depression       GI/Hepatic Neg liver ROS,GERD  ,,  Endo/Other  diabetes, Type 2    Renal/GU Renal InsufficiencyRenal disease     Musculoskeletal  (+) Arthritis ,    Abdominal   Peds  Hematology  (+) Blood dyscrasia, anemia   Anesthesia Other Findings Day of surgery medications reviewed with the patient.  Reproductive/Obstetrics  Pelvic mass                               Anesthesia Physical Anesthesia Plan  ASA: 3  Anesthesia Plan: General   Post-op Pain Management: Tylenol  PO (pre-op)*   Induction: Intravenous  PONV Risk Score and Plan: 4 or greater and Dexamethasone  and Ondansetron   Airway Management Planned: Oral ETT  Additional Equipment: ClearSight  Intra-op Plan:   Post-operative Plan: Extubation in OR  Informed Consent: I have reviewed the patients History and Physical, chart, labs and discussed the procedure including the risks, benefits and alternatives for the proposed anesthesia with the patient or authorized representative who has indicated his/her understanding and acceptance.     Dental advisory given  Plan Discussed with: CRNA  Anesthesia Plan Comments: (2nd PIV after induction.)        Anesthesia Quick Evaluation  "

## 2024-08-07 NOTE — Transfer of Care (Signed)
 Immediate Anesthesia Transfer of Care Note  Patient: [name]  Procedure(s) Performed: DIAGNOSTIC LAPAROSCOPY LAPAROTOMY FOR RIGHT SALPINGO-OOPHORECTOMY (Right) RECTOSIGMOID RESECTION WITH LOW ANASTAMOSIS SMALL BOWEL RESECTION WITH ANASTAMOSIS VULVAR BIOPSIES  Patient Location: PACU  Anesthesia Type:General  Level of Consciousness: awake and alert   Airway & Oxygen Therapy: Patient Spontanous Breathing and Patient connected to face mask oxygen  Post-op Assessment: Report given to RN and Post -op Vital signs reviewed and stable  Post vital signs: Reviewed and stable  Last Vitals:  Vitals Value Taken Time  BP 216/84 08/07/24 22:15  Temp    Pulse 53 08/07/24 22:17  Resp 7 08/07/24 22:17  SpO2 100 % 08/07/24 22:17  Vitals shown include unfiled device data.  Last Pain:  Vitals:   08/07/24 0958  TempSrc: Oral  PainSc: 9          Complications: No notable events documented.

## 2024-08-07 NOTE — Op Note (Signed)
 GYNECOLOGIC ONCOLOGY OPERATIVE NOTE  Date of Service: 08/07/2024  Preoperative Diagnosis: Complex pelvic mass, elevated tumor markers, vulvar lesions with history of condylomas  Postoperative Diagnosis: Complex right ovarian mass, elevated tumor markers, vulvar lesions with history of condylomas  Procedures: Diagnostic laparoscopy, laparotomy for right salpingo-oophorectomy, rectosigmoid resection with low anastomosis, small bowel resection with side-to-side functional end-to-end anastomosis, vaginal cuff repair, cystoscopy, vulvar biopsies (Modifer 22: increased duration of the procedure by >120 min due to complexity due adhesive disease requiring extensive meticulous lysis of adhesions, necessitating additional instrumentation for retraction and safe exposure)  Surgeon: [name], MD  Assistants: Olam Mill, MD and (an MD assistant was necessary for tissue manipulation, management of robotic instrumentation, retraction and positioning due to the complexity of the case and hospital policies)  Anesthesia: General  Estimated Blood Loss: 500 mL    Fluids: 2000 ml, crystalloid  Urine Output: 1000 ml, clear yellow  Findings: On vulvar exam bilateral labia and perineal body involved with raised condylomatous lesions.  Biopsy obtained of the right labia, the left labia, and the perineum.  On diagnostic laparoscopy, upper abdominal survey with few catarrh string-like adhesions of the right liver to the anterior abdominal wall consistent with Fitz-Hugh Curtis syndrome.  Otherwise smooth diaphragm and liver surface.  Normal-appearing stomach, omentum, and bowel.  Cystic and solid mass noted in the pelvis.  No ascites or peritoneal disease.  Given this, decision made to proceed with laparotomy for removal of mass.  On further exploration of the pelvis mass noted to be arising from the right ovary and densely adherent to the rectosigmoid colon, the posterior pelvic peritoneum, the vaginal  cuff, and the bladder.  Additionally loop of distal ileum was adherent to the mass.  With dissection, rupture of the mass occurred.  Upon placing an EEA sizer in the vagina for elevating the mass off of the vaginal cuff, the vaginal apex was separated and opened to the peritoneal cavity.  Apparent tumor spillage was noted through the vagina.  The mass was dissected off of the rectosigmoid colon but following this dissection the integrity of the rectum was noted to have several serosal defects to the muscularis layer.  Given this decision made to resect this portion of the colon with low anastomosis.  Additionally the adherent loop of small bowel similarly had significant disruption in the bowel wall and decision made to resect this small portion of the ileum, approximately 30 cm from the ileocecal valve. Following rectosigmoid anastamosis, negative bubble test. Cystocopy performed with no injury noted to the bladder. Left diverticulum noted. Normal efflux from bilateral ureters.  No palpable pelvic lymphadenopathy.  Palpable prominent left para-aortic lymph node, not enlarged.  IOFS c/w a benign cyst.    Specimens:  ID Type Source Tests Collected by Time Destination  1 : Left vulva Tissue PATH Gyn biopsy SURGICAL PATHOLOGY Masters Hoy, MD 08/07/2024 1617   2 : Right vulva Tissue PATH Gyn biopsy SURGICAL PATHOLOGY Masters Hoy, MD 08/07/2024 1626   3 : Posterior vulva Tissue PATH Gyn biopsy SURGICAL PATHOLOGY Masters Hoy, MD 08/07/2024 1626   4 : Right Ovary Tissue PATH Other SURGICAL PATHOLOGY Masters Hoy, MD 08/07/2024 1802   5 : portion of small bowel Tissue PATH GI tumor resection SURGICAL PATHOLOGY Masters Hoy, MD 08/07/2024 1940   6 : rectum Tissue PATH GI tumor resection SURGICAL PATHOLOGY Masters Hoy, MD 08/07/2024 1957   7 : anastamosis Tissue PATH GI tumor resection SURGICAL PATHOLOGY Masters Hoy, MD 08/07/2024 2019   A :  Pelvic Washing Body Fluid PATH Cytology Pelvic  Washing CYTOLOGY - NON PAP Eldonna Mays, MD 08/07/2024 1713     Complications:  None  Indications for Procedure: TENEISHA GIGNAC is a 79 y.o. woman with a complex pelvic mass and elevated CA125.  At time of her initial consult she was also noted to have multiple raised verrucous lesions on her vulva and history noted of prior condyloma.  Prior to the procedure, all risks, benefits, and alternatives were discussed and informed surgical consent was signed.  Procedure: Patient was taken to the operating room where general anesthesia was achieved.  She was positioned in dorsal lithotomy and prepped and draped.  A foley catheter was inserted into the bladder.   First, her vulva was inspected.  Three vulvar biopsies were obtained using the Tischler biopsy forceps.  Hemostasis was achieved with silver  nitrate.  A 5 mm incision was made in the left upper quadrant near Palmer's point.  The abdomen was entered with a 5 mm OptiView trocar under direct visualization.  The abdomen was insufflated and surveyed with the findings as noted above. The patient placed in Trendelenburg.  An additional 5 mm incision was made in the midline below the umbilicus and a second 5 mm trocar was placed.  A small bowel was mobilized to tried to survey the pelvis extensively.  No additional peritoneal disease or ascites was noted.  The mass in the pelvis was not clearly involving the bowel as could be visualized on laparoscopic survey, so decision was made to proceed with laparotomy for removal.  With insufflation in place, the midline trocar was removed and a vertical midline incision was made with the scalpel and the abdomen was entered sharply.  The abdomen and pelvis were surveyed with findings as documented above. A wound protector was placed followed by the Bookwalter retractor.   The peritoneum overlying the right pelvic sidewall was incised and the retroperitoneum entered.  The right ureter was identified.  The peritoneal  incision was extended caudally towards the bladder.  Adhesions of the mass to the pelvic sidewall bladder peritoneum were incised sharply.  The mass was noted to be fixed to the pelvic peritoneum, rectosigmoid colon, and a loop of small bowel.  An EEA sizer was placed in the vagina to attempt to elevate the mass from the cuff in the bladder.  At this time, the EEA sizer was noted to give way with concern for possible rupture of the apex of the vaginal cuff.  Attention was then returned to the abdomen.  Additional dissection was performed to mobilize the mass from its adhesions and during this dissection and subtle rupture of the mass occurred.  Drainage of what appeared to be tumor contents was noted from the vagina.  Sharp dissection was performed with Metzenbaum scissors to dissect the mass further off of the rectum and the loop of distal ileum.  At this time, the peritoneum overlying the left pelvic sidewall was incised and the left ureter identified.  The rectosigmoid colon adhesions were sharply dissected to normalize the pelvic anatomy to better identify the adhesions of the mass to the rectum.  Additionally, no left adnexa was identified during this dissection.  The mass was ultimately able to be freed from its adhesions to the rectum, the ileum, the vaginal cuff, and the bladder.  The mass was handed off the field for frozen pathology.  The pelvis was inspected and the vaginal cuff was noted to be open approximately 4 cm.  The  anterior vaginal cuff was grasped.  The bladder was mobilized sharply off of the anterior vaginal wall.  The posterior wall of the bladder was then grasped.  An EEA sizer was placed in the rectum and the rectovaginal space was developed with blunt and sharp dissection to mobilize the posterior wall of the vagina.  The vaginal cuff was then closed with several figure-of-eight stitches of 0 Vicryl.  The rectum and ileum were then both inspected.  Both areas of bowel that had been  adherent to the mass were noted to have several areas of disruption of the serosa and muscularis and thus decision was made to resect these portions.  The rectosigmoid colon was mobilized and elevated.  A small window was made in the mesentery and the rectosigmoid was transected with GIA stapler at the level of the sacral promontory. The mesentery was transected with the Ligasure to allow mobilization of the rectosigmoid. The suprior portion of the sigmoid was packed superiorly until later in the case.  Posterior rectal pillars were isolated, cauterized and cut with Ligasure and carried down to below the level of affected rectum, completely mobilizing the rectal tube. The distal rectum was divided with a contour stapler. The specimen was handed off the field.  The affected area of small bowel was then identified. This area was noted to be approximately 30cm from the ileocecal valve. A defect was made in the mesentery distal to the disrupted bowel wall. The GIA stapler was placed and the bowel at this level was stapled and divided. A similar defect was made proximal to the affected area and the bowel was again stapled and divided with the GIA stapler. The mesentery of this section of bowel was cauterized and transected with the ligasure. The proximal and distal ends of the ileum were aligned. A stabilizing suture was placed proximally. A defect was made in the ileal segments and the GIA stapler was used to create a side-to-side, functional end to end anastomosis. The defect left was grasped with allis clamps to elevate the cut edge. The GIA stapler was then used to close the defect. A couple of interrupted sutures of 2-0 vicryl were used to close the mesenteric defect.  The proximal descending colon was again identified and the colon was mobilized and found to reach the pelvis without tension. The proximal transected colon was grasped and the staple line removed. The colon lumen was measured with EEA sizers  and a pursestring suture was placed around the colonic opening. The EEA anvil was placed and the suture was tied down securing the anvil. A 25 EEA stapler was available and was used from below. This was placed in the rectum and the anvil was secured in the stapler. The stapler was fired and two rings from the anastomotic site were confirmed after the EEA stapler was removed. The pelvis was filled with fluid, and air was pushed through the rectum, the patient was noted to have a negative bubble test, the anastomotic site was intact without any leakage of air.  The bladder was then backfilled with 200 mL of saline.  The Foley catheter was removed.  Camera was introduced into the bladder and a cystoscopy was performed with the findings noted above.  The camera was removed and the Foley catheter replaced, draining the bladder.  The pelvis was copiously irrigated.  Additional electrocautery was used to obtain hemostasis along with Floseal.  Hemostasis was achieved.  A JP drain was then placed in the pelvis overlying the rectosigmoid  anastomosis.  This drain was exited out through the right lower quadrant abdominal wall.  The drain was secured with a drain stitch.  The fascia was closed with two running stitches of #1 PDS, tied separately in the midline. Exparel  was injected at the incision site in standard fashion. The subcutaneous tissues were irrigated and hemostasis achieved. The subcutaneous space was approximated with 2-0 Vicryl suture. The skin was closed with 4-0 Vicryl deep dermal and 4-0 monocryl in a subcuticular fashion followed by surgical glue.  The laparoscopic incision was closed with a deep dermal stitch of 4-0 Vicryl followed by 4-0 monocryl in a subcuticular fashion and surgical glue.  Patient tolerated the procedure well. Sponge, lap, and instrument counts were correct.  2 gm of Ancef  was administered prior to skin incision for routine perioperative antibiotics.  500 mg of metronidazole  was  added during the procedure when the vaginal cuff was noted to open as well as given decision to proceed with colon surgery.  Patient was extubated and taken to the PACU in stable condition.  [name], MD Gynecologic Oncology

## 2024-08-07 NOTE — Anesthesia Postprocedure Evaluation (Signed)
"   Anesthesia Post Note  Patient: [name]  Procedure(s) Performed: DIAGNOSTIC LAPAROSCOPY LAPAROTOMY FOR RIGHT SALPINGO-OOPHORECTOMY (Right) RECTOSIGMOID RESECTION WITH LOW ANASTAMOSIS SMALL BOWEL RESECTION WITH ANASTAMOSIS VULVAR BIOPSIES     Patient location during evaluation: PACU Anesthesia Type: General Level of consciousness: sedated, oriented and patient cooperative Pain management: pain level controlled Vital Signs Assessment: post-procedure vital signs reviewed and stable Respiratory status: spontaneous breathing, nonlabored ventilation, respiratory function stable and patient connected to nasal cannula oxygen Cardiovascular status: blood pressure returned to baseline and stable Postop Assessment: no apparent nausea or vomiting Anesthetic complications: no   No notable events documented.  Last Vitals:  Vitals:   08/07/24 2227 08/07/24 2230  BP: (!) 179/78 (!) 174/73  Pulse:    Resp: 12 10  Temp:    SpO2: 97%     Last Pain:  Vitals:   08/07/24 0958  TempSrc: Oral  PainSc: 9                  Donise Woodle,E. Alilah Mcmeans      "

## 2024-08-07 NOTE — Brief Op Note (Signed)
 08/07/2024 12:00 PM  9:56 PM  PATIENT:  [name]  79 y.o. female  PRE-OPERATIVE DIAGNOSIS:  Complex pelvic mass, elevated tumor markers  POST-OPERATIVE DIAGNOSIS: Same  PROCEDURE:  Procedures with comments: DIAGNOSTIC LAPAROSCOPY (N/A) LAPAROTOMY FOR RIGHT SALPINGO-OOPHORECTOMY (Right) - EXPLORATORY LAPAROTOMY RECTOSIGMOID RESECTION WITH LOW ANASTAMOSIS SMALL BOWEL RESECTION WITH ANASTAMOSIS VULVAR BIOPSIES  SURGEON:  Surgeons and Role:    DEWAINE Eldonna Mays, MD - Primary    DEWAINE Rogelio Planas, MD  PHYSICIAN ASSISTANT:   EBL:  500 mL   BLOOD ADMINISTERED:none  DRAINS: (65F) Jackson-Pratt drain(s) with closed bulb suction in the RLQ   LOCAL MEDICATIONS USED:  BUPIVICAINE  and OTHER EXPAREL   SPECIMEN:   ID Type Source Tests Collected by Time Destination  1 : Left vulva Tissue PATH Gyn biopsy SURGICAL PATHOLOGY Eldonna Mays, MD 08/07/2024 1617   2 : Right vulva Tissue PATH Gyn biopsy SURGICAL PATHOLOGY Eldonna Mays, MD 08/07/2024 1626   3 : Posterior vulva Tissue PATH Gyn biopsy SURGICAL PATHOLOGY Eldonna Mays, MD 08/07/2024 1626   4 : Right Ovary Tissue PATH Other SURGICAL PATHOLOGY Eldonna Mays, MD 08/07/2024 1802   5 : portion of small bowel Tissue PATH GI tumor resection SURGICAL PATHOLOGY Eldonna Mays, MD 08/07/2024 1940   6 : rectum Tissue PATH GI tumor resection SURGICAL PATHOLOGY Eldonna Mays, MD 08/07/2024 1957   7 : anastamosis Tissue PATH GI tumor resection SURGICAL PATHOLOGY Eldonna Mays, MD 08/07/2024 2019   A : Pelvic Washing Body Fluid PATH Cytology Pelvic Washing CYTOLOGY - NON PAP Eldonna Mays, MD 08/07/2024 1713     DISPOSITION OF SPECIMEN:  PATHOLOGY  COUNTS:  YES  TOURNIQUET:  * No tourniquets in log *  DICTATION: .Note written in EPIC  PLAN OF CARE: Admit to inpatient   PATIENT DISPOSITION:  PACU - hemodynamically stable.   Delay start of Pharmacological VTE agent (>24hrs) due to surgical blood loss or risk of  bleeding: no

## 2024-08-07 NOTE — Anesthesia Procedure Notes (Signed)
 Procedure Name: Intubation Date/Time: 08/07/2024 4:02 PM  Performed by: Nada Corean CROME, CRNAPre-anesthesia Checklist: Patient identified, Emergency Drugs available, Suction available, Patient being monitored and Timeout performed Patient Re-evaluated:Patient Re-evaluated prior to induction Oxygen Delivery Method: Circle system utilized Preoxygenation: Pre-oxygenation with 100% oxygen Induction Type: IV induction Ventilation: Mask ventilation without difficulty Laryngoscope Size: Mac and 3 Grade View: Grade I Tube type: Oral Tube size: 7.0 mm Number of attempts: 1 Airway Equipment and Method: Stylet Placement Confirmation: ETT inserted through vocal cords under direct vision, positive ETCO2, breath sounds checked- equal and bilateral and CO2 detector Secured at: 22 cm Tube secured with: Tape Dental Injury: Teeth and Oropharynx as per pre-operative assessment

## 2024-08-07 NOTE — H&P (Signed)
 Brief Pre-operative History & Physical  Patient name: [name] CSN: [id number] MRN: [id number] Admit Date: 08/07/2024 Date of Surgery: 08/07/2024 Performing Service: Gynecology   Code Status: Prior    Assessment & Plan    [name] is a 79 y.o. female with pelvic mass, who presents for: Procedures (LRB): LAPAROSCOPY, DIAGNOSTIC (N/A) SALPINGO-OOPHORECTOMY, OPEN (Bilateral) DEBULKING, ABDOMINAL (N/A) BIOPSY, VULVA (N/A).   Consent obtained in office is accurate. Risks, benefits, and alternatives to surgery were reviewed, and all questions were answered.  Proceed to the OR as planned.     History of Present Illness:  [name] is a 79 y.o. female with pelvic mass. She was recently seen in clinic, where a detailed HPI can be found. She was noted to benefit from: Procedures (LRB): LAPAROSCOPY, DIAGNOSTIC (N/A) SALPINGO-OOPHORECTOMY, OPEN (Bilateral) DEBULKING, ABDOMINAL (N/A) BIOPSY, VULVA (N/A).   Medical History Past Medical History:  Diagnosis Date   Arthritis    Atypical face pain    CAD (coronary artery disease)    Catheterization 2006, mild two-vessel nonobstructive disease   CKD (chronic kidney disease), stage III (HCC)    Ejection fraction    EF 60%, echo, October, 2010,   Fluid overload    Foot pain, right    November, 2012   Headache(784.0)    History of kidney stones    Hyperlipidemia    Hypertension    Mitral regurgitation    Mild, echo, October, 2010   Overweight(278.02)    PAD (peripheral artery disease)    Dr. Harvey,  Bilateral superficial femoral artery occlusions with one vessel runoff via the peroneal artery   Shortness of breath    Sleep apnea    CPAP compliance ???   Urinary incontinence    UTI (lower urinary tract infection)    Surgical History Past Surgical History:  Procedure Laterality Date   ABDOMINAL HYSTERECTOMY     Due to bleeding, dysmenorrhea   ARTERY BIOPSY  12/27/2011   Procedure: BIOPSY TEMPORAL ARTERY;  Surgeon:  Carlin FORBES Harvey, MD;  Location: [facility];  Service: Vascular;  Laterality: Left;   CARDIAC CATHETERIZATION N/A 04/23/2015   Procedure: Left Heart Cath and Coronary Angiography;  Surgeon: Candyce GORMAN Reek, MD;  Location: [facility] INVASIVE CV LAB;  Service: Cardiovascular;  Laterality: N/A;   EYE SURGERY Bilateral 12/11/2010   Cataract   REVERSE SHOULDER ARTHROPLASTY Right 09/18/2015   Procedure: REVERSE SHOULDER ARTHROPLASTY;  Surgeon: Eva Herring, MD;  Location: [facility] OR;  Service: Orthopedics;  Laterality: Right;  Right reverse total shoulder arthroplasty   REVERSE TOTAL SHOULDER ARTHROPLASTY Right 09/18/2015   SHOULDER ARTHROSCOPY Right 02/24/2015   Allergies Latex and Amoxicillin  Medications   Current Facility-Administered Medications  Medication Dose Route Frequency Provider Last Rate Last Admin   ceFAZolin  (ANCEF ) IVPB 2g/100 mL premix  2 g Intravenous On Call to OR Cross, Melissa D, NP       dexamethasone  (DECADRON ) injection 4 mg  4 mg Intravenous On Call to OR Cross, Eleanor BIRCH, NP       lactated ringers  infusion   Intravenous Continuous Jerrye Sharper, MD 10 mL/hr at 08/07/24 1041 New Bag at 08/07/24 1041    Vital Signs BP (!) 171/72   Pulse (!) 51   Temp 98.7 F (37.1 C) (Oral)   Resp 16   Ht 5' 2 (1.575 m)   Wt 126 lb 9.6 oz (57.4 kg)   SpO2 99%   BMI 23.16 kg/m  Facility age limit for growth %iles is 20 years.  Facility age limit for growth %iles is 20 years..   Physical Exam General: Well developed, appears stated age, in no acute distress  Mental status: Alert and oriented x3 Cardiovascular: Normal Pulmonary: Symmetric chest rise, unlabored breathing Relevant System for Surgery: Surgical site examination deferred to the OR   Labs and Studies: Lab Results  Component Value Date   WBC 4.4 08/06/2024   HGB 10.1 (L) 08/06/2024   HCT 32.6 (L) 08/06/2024   PLT 204 08/06/2024    Lab Results  Component Value Date   INR 1.02 09/09/2015   APTT 29 09/09/2015    \

## 2024-08-07 NOTE — Discharge Instructions (Addendum)
 AFTER SURGERY INSTRUCTIONS   Return to work: 4-6 weeks if applicable   Plan to keep a clean, dry dressing over the drain site and change this daily. When this area is not draining or has scabbed over, you can use a bandaid to cover this area.   Plan to start taking Eliquis  the blood thinner tonight (evening of August 16, 2024) and you will take this twice daily for an additional 19 days. After you have completed this, you can restart your aspirin . Do not take aspirin  while taking eliquis . The Eliquis  is to prevent blood clots. Please call for heavy vaginal bleeding. Do not take NSAIDS.  Activity: 1. Be up and out of the bed during the day.  Take a nap if needed.  You may walk up steps but be careful and use the hand rail.  Stair climbing will tire you more than you think, you may need to stop part way and rest.    2. No lifting or straining for 6 weeks over 10 pounds. No pushing, pulling, straining for 6 weeks.   3. No driving for 4-89 days when the following criteria have been met: Do not drive if you are taking narcotic pain medicine and make sure that your reaction time has returned.    4. You can shower as soon as the next day after surgery. Shower daily.  Use your regular soap and water (not directly on the incision) and pat your incision(s) dry afterwards; don't rub.  No tub baths or submerging your body in water until cleared by your surgeon. If you have the soap that was given to you by pre-surgical testing that was used before surgery, you do not need to use it afterwards because this can irritate your incisions.    5. No sexual activity and nothing in the vagina for 6 weeks.   6. You may experience a small amount of clear drainage from your incisions, which is normal.  If the drainage persists, increases, or changes color please call the office.   7. Do not use creams, lotions, or ointments such as neosporin on your incisions after surgery until advised by your surgeon because they  can cause removal of the dermabond glue on your incisions.     8. You may experience vaginal spotting after surgery or when the stitches at the top of the vagina begin to dissolve.  The spotting is normal but if you experience heavy bleeding, call our office.   9. Take Tylenol  first for pain if you are able to take these medication and only use narcotic pain medication for severe pain not relieved by the Tylenol .  Monitor your Tylenol  intake to a max of 4,000 mg in a 24 hour period. MONITOR YOUR TYLENOL  INTAKE SINCE HYDROCODONE  HAS TYLENOL  IN IT.   Diet: 1. Low sodium Heart Healthy Diet is recommended but you are cleared to resume your normal (before surgery) diet after your procedure.   2. It is safe to use a laxative, such as Miralax  or Colace, if you have difficulty moving your bowels before surgery. YOU WILL NEED TO TAKE MIRALAX  DAILY TO PREVENT CONSTIPATION.    Wound Care: 1. Keep clean and dry.  Shower daily.   Reasons to call the Doctor: Fever - Oral temperature greater than 100.4 degrees Fahrenheit Foul-smelling vaginal discharge Difficulty urinating Nausea and vomiting Increased pain at the site of the incision that is unrelieved with pain medicine. Difficulty breathing with or without chest pain New calf pain especially if  only on one side Sudden, continuing increased vaginal bleeding with or without clots.   Contacts: For questions or concerns you should contact:   Dr. [name] at [phone]   Eleanor Epps, NP at [phone]   After Hours: call [phone] and have the GYN Oncologist paged/contacted (after 5 pm or on the weekends). You will speak with an after hours RN and let he or she know you have had surgery.   Messages sent via mychart are for non-urgent matters and are not responded to after hours so for urgent needs, please call the after hours number.

## 2024-08-08 ENCOUNTER — Encounter (HOSPITAL_COMMUNITY): Payer: Self-pay | Admitting: Psychiatry

## 2024-08-08 LAB — CBC
HCT: 27.5 % — ABNORMAL LOW (ref 36.0–46.0)
Hemoglobin: 8.9 g/dL — ABNORMAL LOW (ref 12.0–15.0)
MCH: 29.2 pg (ref 26.0–34.0)
MCHC: 32.4 g/dL (ref 30.0–36.0)
MCV: 90.2 fL (ref 80.0–100.0)
Platelets: 112 K/uL — ABNORMAL LOW (ref 150–400)
RBC: 3.05 MIL/uL — ABNORMAL LOW (ref 3.87–5.11)
RDW: 12.3 % (ref 11.5–15.5)
WBC: 10.2 K/uL (ref 4.0–10.5)
nRBC: 0 % (ref 0.0–0.2)

## 2024-08-08 LAB — BASIC METABOLIC PANEL WITH GFR
Anion gap: 9 (ref 5–15)
BUN: 18 mg/dL (ref 8–23)
CO2: 26 mmol/L (ref 22–32)
Calcium: 9.4 mg/dL (ref 8.9–10.3)
Chloride: 99 mmol/L (ref 98–111)
Creatinine, Ser: 1.13 mg/dL — ABNORMAL HIGH (ref 0.44–1.00)
GFR, Estimated: 49 mL/min — ABNORMAL LOW
Glucose, Bld: 142 mg/dL — ABNORMAL HIGH (ref 70–99)
Potassium: 4.7 mmol/L (ref 3.5–5.1)
Sodium: 134 mmol/L — ABNORMAL LOW (ref 135–145)

## 2024-08-08 MED ORDER — TRAZODONE HCL 100 MG PO TABS
200.0000 mg | ORAL_TABLET | Freq: Every day | ORAL | Status: DC
  Administered 2024-08-08: 200 mg via ORAL
  Filled 2024-08-08: qty 2

## 2024-08-08 MED ORDER — CARVEDILOL 6.25 MG PO TABS
6.2500 mg | ORAL_TABLET | Freq: Two times a day (BID) | ORAL | Status: DC
  Administered 2024-08-08 – 2024-08-16 (×17): 6.25 mg via ORAL
  Filled 2024-08-08 (×18): qty 1

## 2024-08-08 MED ORDER — SENNOSIDES-DOCUSATE SODIUM 8.6-50 MG PO TABS
2.0000 | ORAL_TABLET | Freq: Every day | ORAL | Status: DC
  Administered 2024-08-08 – 2024-08-12 (×5): 2 via ORAL
  Filled 2024-08-08 (×5): qty 2

## 2024-08-08 MED ORDER — ONDANSETRON HCL 4 MG/2ML IJ SOLN
4.0000 mg | Freq: Four times a day (QID) | INTRAMUSCULAR | Status: DC | PRN
  Administered 2024-08-14 – 2024-08-15 (×2): 4 mg via INTRAVENOUS
  Filled 2024-08-08 (×2): qty 2

## 2024-08-08 MED ORDER — LABETALOL HCL 100 MG PO TABS
200.0000 mg | ORAL_TABLET | Freq: Once | ORAL | Status: AC
  Administered 2024-08-08: 200 mg via ORAL
  Filled 2024-08-08: qty 2

## 2024-08-08 MED ORDER — ISOSORBIDE MONONITRATE ER 30 MG PO TB24
30.0000 mg | ORAL_TABLET | Freq: Every day | ORAL | Status: DC
  Administered 2024-08-08 – 2024-08-16 (×9): 30 mg via ORAL
  Filled 2024-08-08 (×9): qty 1

## 2024-08-08 MED ORDER — NON FORMULARY: 1.0000 [IU] | Freq: Three times a day (TID) | Status: DC

## 2024-08-08 MED ORDER — HYDROMORPHONE HCL 1 MG/ML IJ SOLN
0.5000 mg | INTRAMUSCULAR | Status: DC | PRN
  Administered 2024-08-09 – 2024-08-11 (×3): 0.5 mg via INTRAVENOUS
  Filled 2024-08-08 (×3): qty 0.5

## 2024-08-08 MED ORDER — CITALOPRAM HYDROBROMIDE 20 MG PO TABS
20.0000 mg | ORAL_TABLET | Freq: Every evening | ORAL | Status: DC
  Administered 2024-08-08 – 2024-08-15 (×8): 20 mg via ORAL
  Filled 2024-08-08 (×8): qty 1

## 2024-08-08 MED ORDER — OXYCODONE HCL 5 MG PO TABS
5.0000 mg | ORAL_TABLET | ORAL | Status: DC | PRN
  Administered 2024-08-08 – 2024-08-16 (×15): 5 mg via ORAL
  Filled 2024-08-08 (×16): qty 1

## 2024-08-08 MED ORDER — TRAMADOL HCL 50 MG PO TABS
100.0000 mg | ORAL_TABLET | Freq: Two times a day (BID) | ORAL | Status: DC | PRN
  Administered 2024-08-08 – 2024-08-16 (×5): 100 mg via ORAL
  Filled 2024-08-08 (×6): qty 2

## 2024-08-08 MED ORDER — CHEWING GUM (ORBIT) SUGAR FREE
1.0000 | CHEWING_GUM | Freq: Three times a day (TID) | ORAL | Status: AC
  Administered 2024-08-08 – 2024-08-09 (×6): 1 via ORAL
  Filled 2024-08-08: qty 1

## 2024-08-08 MED ORDER — ACETAMINOPHEN 500 MG PO TABS
1000.0000 mg | ORAL_TABLET | Freq: Two times a day (BID) | ORAL | Status: DC
  Administered 2024-08-08 – 2024-08-16 (×16): 1000 mg via ORAL
  Filled 2024-08-08 (×16): qty 2

## 2024-08-08 MED ORDER — ATORVASTATIN CALCIUM 20 MG PO TABS
40.0000 mg | ORAL_TABLET | Freq: Every evening | ORAL | Status: DC
  Administered 2024-08-08 – 2024-08-15 (×8): 40 mg via ORAL
  Filled 2024-08-08 (×8): qty 2

## 2024-08-08 MED ORDER — ONDANSETRON HCL 4 MG PO TABS: 4.0000 mg | ORAL_TABLET | Freq: Four times a day (QID) | ORAL | Status: DC | PRN

## 2024-08-08 MED ORDER — ENOXAPARIN SODIUM 30 MG/0.3ML IJ SOSY
30.0000 mg | PREFILLED_SYRINGE | INTRAMUSCULAR | Status: DC
  Administered 2024-08-08 – 2024-08-09 (×2): 30 mg via SUBCUTANEOUS
  Filled 2024-08-08: qty 0.3

## 2024-08-08 MED ORDER — GABAPENTIN 100 MG PO CAPS
100.0000 mg | ORAL_CAPSULE | Freq: Three times a day (TID) | ORAL | Status: DC
  Administered 2024-08-08 – 2024-08-16 (×25): 100 mg via ORAL
  Filled 2024-08-08 (×25): qty 1

## 2024-08-08 MED ORDER — NITROGLYCERIN 0.4 MG SL SUBL: 0.4000 mg | SUBLINGUAL_TABLET | SUBLINGUAL | Status: DC | PRN

## 2024-08-08 NOTE — Progress Notes (Signed)
 1 Day Post-Op Procedures (LRB): DIAGNOSTIC LAPAROSCOPY (N/A) LAPAROTOMY FOR RIGHT SALPINGO-OOPHORECTOMY (Right) RECTOSIGMOID RESECTION WITH LOW ANASTAMOSIS SMALL BOWEL RESECTION WITH ANASTAMOSIS VULVAR BIOPSIES  Subjective: Patient reports abdominal soreness. States she passed small amount of flatus. No nausea or emesis reported. Denies chest pain, dyspnea. Reports having cramping in legs last pm that resolved. Has not been out of bed.   Objective: Vital signs in last 24 hours: Temp:  [95.6 F (35.3 C)-98.7 F (37.1 C)] 98.4 F (36.9 C) (01/14 0508) Pulse Rate:  [48-75] 64 (01/14 0508) Resp:  [10-17] 17 (01/14 0508) BP: (156-216)/(71-94) 175/82 (01/14 0508) SpO2:  [96 %-100 %] 100 % (01/14 0508) Weight:  [126 lb 9.6 oz (57.4 kg)] 126 lb 9.6 oz (57.4 kg) (01/13 0958) Last BM Date : 08/07/24  Intake/Output from previous day: 01/13 0701 - 01/14 0700 In: 5350 [I.V.:4900; IV Piggyback:450] Out: 2495 [Urine:1900; Drains:95; Blood:500]  Physical Examination: General: alert, cooperative, and no distress Resp: clear to auscultation bilaterally Cardio: regular rate and rhythm, S1, S2 normal, no murmur, click, rub or gallop GI: incision: abdominal incision covered with a dry dressing, jp drain with serosanguinous drainage and abdomen soft, hypoactive bowel sounds, non-distended Extremities: extremities normal, atraumatic, no cyanosis or edema SCDs in place. Granddaughter at the bedside. Abd binder in place. Loosen while in bed.  Labs: WBC/Hgb/Hct/Plts:  10.2/8.9/27.5/112 (01/14 0504)    Assessment: 79 y.o. s/p Procedures: DIAGNOSTIC LAPAROSCOPY LAPAROTOMY FOR RIGHT SALPINGO-OOPHORECTOMY RECTOSIGMOID RESECTION WITH LOW ANASTAMOSIS SMALL BOWEL RESECTION WITH ANASTAMOSIS VULVAR BIOPSIES: stable Pain:  Pain is well-controlled on PRN medications.  Heme: Hgb 8.9 and Hct 27.5 this am. EBL 500 cc. Continue to monitor with labs.  ID: WBC 10.2. Given decadron  and intra-op antibiotics.    CV: BP elevated. Will continue to monitor, meds ordered. HR stable.  GI:  Tolerating po: yes to clears. Antiemetics ordered if needed. S/P RECTOSIGMOID RESECTION WITH LOW ANASTOMOSIS, SMALL BOWEL RESECTION WITH ANASTOMOSIS.   GU: Foley removed this am. Awaiting results of Bmet. Adequate urine reported overnight.  FEN: Awaiting results of Bmet.   Prophylaxis: Lovenox  and SCDs ordered.  Plan: Awaiting results of Bmet. Imdur  ordered. Will continue to monitor BP. Increase mobility Due to void after foley removal. Continue strict intake and output after. Continue with clear liquid diet. Given IS and instructed on use.   LOS: 1 day    [name] 08/08/2024, 9:22 AM

## 2024-08-08 NOTE — Progress Notes (Addendum)
 Patient blood pressure currently 185/90. MD on call, Dr. Leonce Ada notified. Discussed with MD that patient had a dose of Coreg  after coming up to the floor. Blood pressure  continued to be elevated.  New orders received. Plan of care ongoing.

## 2024-08-08 NOTE — Progress Notes (Signed)
 Patient foley removed per order.

## 2024-08-09 ENCOUNTER — Telehealth: Payer: Self-pay | Admitting: Oncology

## 2024-08-09 LAB — BASIC METABOLIC PANEL WITH GFR
Anion gap: 5 (ref 5–15)
BUN: 20 mg/dL (ref 8–23)
CO2: 26 mmol/L (ref 22–32)
Calcium: 8.8 mg/dL — ABNORMAL LOW (ref 8.9–10.3)
Chloride: 100 mmol/L (ref 98–111)
Creatinine, Ser: 0.99 mg/dL (ref 0.44–1.00)
GFR, Estimated: 57 mL/min — ABNORMAL LOW
Glucose, Bld: 115 mg/dL — ABNORMAL HIGH (ref 70–99)
Potassium: 4.6 mmol/L (ref 3.5–5.1)
Sodium: 131 mmol/L — ABNORMAL LOW (ref 135–145)

## 2024-08-09 LAB — CBC
HCT: 21.9 % — ABNORMAL LOW (ref 36.0–46.0)
Hemoglobin: 7.2 g/dL — ABNORMAL LOW (ref 12.0–15.0)
MCH: 29.6 pg (ref 26.0–34.0)
MCHC: 32.9 g/dL (ref 30.0–36.0)
MCV: 90.1 fL (ref 80.0–100.0)
Platelets: 183 K/uL (ref 150–400)
RBC: 2.43 MIL/uL — ABNORMAL LOW (ref 3.87–5.11)
RDW: 12.6 % (ref 11.5–15.5)
WBC: 7.6 K/uL (ref 4.0–10.5)
nRBC: 0 % (ref 0.0–0.2)

## 2024-08-09 LAB — PREPARE RBC (CROSSMATCH)

## 2024-08-09 LAB — HEMOGLOBIN AND HEMATOCRIT, BLOOD
HCT: 27.5 % — ABNORMAL LOW (ref 36.0–46.0)
Hemoglobin: 9.3 g/dL — ABNORMAL LOW (ref 12.0–15.0)

## 2024-08-09 MED ORDER — LOSARTAN POTASSIUM 50 MG PO TABS
50.0000 mg | ORAL_TABLET | Freq: Every day | ORAL | Status: DC
  Administered 2024-08-09 – 2024-08-11 (×3): 50 mg via ORAL
  Filled 2024-08-09 (×3): qty 1

## 2024-08-09 MED ORDER — TRAZODONE HCL 100 MG PO TABS
100.0000 mg | ORAL_TABLET | Freq: Every day | ORAL | Status: DC
  Administered 2024-08-09 – 2024-08-15 (×6): 100 mg via ORAL
  Filled 2024-08-09 (×7): qty 1

## 2024-08-09 NOTE — Telephone Encounter (Signed)
 Called [name] and discussed that Jasnoor's Hgb is 7.2 this morning and that we are recommending a blood transfusion today to help increase her levels and help with healing. [name] verbalized understanding and agreement and will be coming to the hospital to stay with Lauraine after work today.

## 2024-08-09 NOTE — Progress Notes (Signed)
 Patient resting is bed at this time. Family member at bedside. Plan of care ongoing.

## 2024-08-09 NOTE — Telephone Encounter (Signed)
 Left a message for [name] regarding a blood transfusion for her [name] today.  Requested a return call.

## 2024-08-09 NOTE — Progress Notes (Signed)
 2 Days Post-Op Procedures (LRB): DIAGNOSTIC LAPAROSCOPY (N/A) LAPAROTOMY FOR RIGHT SALPINGO-OOPHORECTOMY (Right) RECTOSIGMOID RESECTION WITH LOW ANASTAMOSIS SMALL BOWEL RESECTION WITH ANASTAMOSIS VULVAR BIOPSIES  Subjective: Patient reports continued abdominal soreness. States she has passed small amount of flatus. No BM. No nausea or emesis reported. Denies chest pain, dyspnea. Tolerated clear liquids. Reports voiding went well after foley removal.  Objective: Vital signs in last 24 hours: Temp:  [98 F (36.7 C)-100.9 F (38.3 C)] 99.3 F (37.4 C) (01/15 0710) Pulse Rate:  [49-67] 67 (01/15 0548) Resp:  [15-18] 15 (01/15 0548) BP: (130-150)/(62-71) 130/62 (01/15 0548) SpO2:  [98 %-100 %] 99 % (01/15 0548) Last BM Date : 08/07/24  Intake/Output from previous day: 01/14 0701 - 01/15 0700 In: 1158.4 [I.V.:1158.4] Out: 1190 [Urine:1065; Drains:125]  Physical Examination: General: cooperative, no distress, and appears sleepy, slow to respond, talking softly Resp: clear to auscultation bilaterally Cardio: regular rate and rhythm, S1, S2 normal, no murmur, click, rub or gallop GI: incision: abdominal incision covered with a dry dressing, jp drain with serosanguinous drainage and abdomen soft, hypoactive bowel sounds, non-distended Extremities: extremities normal, atraumatic, no cyanosis or edema SCDs in place.  Labs: WBC/Hgb/Hct/Plts:  7.6/7.2/21.9/183 (01/15 0502) BUN/Cr/glu/ALT/AST/amyl/lip:  20/0.99/--/--/--/--/-- (01/15 0502)  Assessment: 79 y.o. s/p Procedures: DIAGNOSTIC LAPAROSCOPY LAPAROTOMY FOR RIGHT SALPINGO-OOPHORECTOMY RECTOSIGMOID RESECTION WITH LOW ANASTAMOSIS SMALL BOWEL RESECTION WITH ANASTAMOSIS VULVAR BIOPSIES: stable Pain:  Pain is well-controlled on PRN medications.  Heme: Hgb 7.2 from 8.9 and Hct 21.9 from 27.5. EBL 500 cc. Continue to monitor with labs. Will discuss possible need for transfusion with Dr. Eldonna. Drain with serosanguinous drainage.  ID:  WBC 7.6. Given decadron  and intra-op antibiotics. Low grade elevation in temperature overnight that improved. Continue to monitor.  CV: Hypertension improving. Will continue to monitor, meds ordered. HR stable.  GI:  Tolerating po: yes to clears. Antiemetics ordered if needed. S/P RECTOSIGMOID RESECTION WITH LOW ANASTOMOSIS, SMALL BOWEL RESECTION WITH ANASTOMOSIS.   GU: Voiding after foley removal. Creatinine 0.99. Adequate urine reported overnight.  FEN: No critical values on am labs.   Prophylaxis: Lovenox  and SCDs ordered.  Plan: Will discuss possible transfusion with Dr. Eldonna Increase mobility/IS Continue with current plan of care   LOS: 2 days    [name] 08/09/2024, 10:12 AM

## 2024-08-09 NOTE — Progress Notes (Signed)
" °   08/09/24 1319  TOC Brief Assessment  Insurance and Status Reviewed  Patient has primary care physician Yes  Home environment has been reviewed home alone with family support  Prior level of function: independent  Prior/Current Home Services No current home services  Social Drivers of Health Review SDOH reviewed no interventions necessary  Readmission risk has been reviewed Yes  Transition of care needs no transition of care needs at this time    "

## 2024-08-09 NOTE — Plan of Care (Signed)
" °  Problem: Education: Goal: Knowledge of General Education information will improve Description: Including pain rating scale, medication(s)/side effects and non-pharmacologic comfort measures Outcome: Progressing   Problem: Clinical Measurements: Goal: Ability to maintain clinical measurements within normal limits will improve Outcome: Progressing Goal: Will remain free from infection Outcome: Progressing Goal: Diagnostic test results will improve Outcome: Progressing Goal: Respiratory complications will improve Outcome: Progressing Goal: Cardiovascular complication will be avoided Outcome: Progressing   Problem: Activity: Goal: Risk for activity intolerance will decrease Outcome: Progressing   Problem: Nutrition: Goal: Adequate nutrition will be maintained Outcome: Progressing   Problem: Coping: Goal: Level of anxiety will decrease Outcome: Progressing   Problem: Elimination: Goal: Will not experience complications related to urinary retention Outcome: Progressing   Problem: Pain Managment: Goal: General experience of comfort will improve and/or be controlled Outcome: Progressing   Problem: Safety: Goal: Ability to remain free from injury will improve Outcome: Progressing   Problem: Skin Integrity: Goal: Risk for impaired skin integrity will decrease Outcome: Progressing   Problem: Education: Goal: Knowledge of the prescribed therapeutic regimen will improve Outcome: Progressing   Problem: Bowel/Gastric: Goal: Gastrointestinal status for postoperative course will improve Outcome: Progressing   Problem: Cardiac: Goal: Ability to maintain an adequate cardiac output Outcome: Progressing   Problem: Nutritional: Goal: Will attain and maintain optimal nutritional status Outcome: Progressing   Problem: Neurological: Goal: Will regain or maintain usual level of consciousness Outcome: Progressing   Problem: Clinical Measurements: Goal: Ability to maintain  clinical measurements within normal limits Outcome: Progressing Goal: Postoperative complications will be avoided or minimized Outcome: Progressing   Problem: Respiratory: Goal: Will regain and/or maintain adequate ventilation Outcome: Progressing Goal: Respiratory status will improve Outcome: Progressing   Problem: Skin Integrity: Goal: Demonstrates signs of wound healing without infection Outcome: Progressing   Problem: Urinary Elimination: Goal: Will remain free from infection Outcome: Progressing Goal: Ability to achieve and maintain adequate urine output Outcome: Progressing   Problem: Education: Goal: Knowledge of the prescribed therapeutic regimen will improve Outcome: Progressing   Problem: Skin Integrity: Goal: Demonstration of wound healing without infection will improve Outcome: Progressing   "

## 2024-08-10 LAB — CBC
HCT: 27.9 % — ABNORMAL LOW (ref 36.0–46.0)
Hemoglobin: 9.1 g/dL — ABNORMAL LOW (ref 12.0–15.0)
MCH: 29.3 pg (ref 26.0–34.0)
MCHC: 32.6 g/dL (ref 30.0–36.0)
MCV: 89.7 fL (ref 80.0–100.0)
Platelets: 179 K/uL (ref 150–400)
RBC: 3.11 MIL/uL — ABNORMAL LOW (ref 3.87–5.11)
RDW: 13.5 % (ref 11.5–15.5)
WBC: 8.2 K/uL (ref 4.0–10.5)
nRBC: 0 % (ref 0.0–0.2)

## 2024-08-10 LAB — BASIC METABOLIC PANEL WITH GFR
Anion gap: 7 (ref 5–15)
BUN: 18 mg/dL (ref 8–23)
CO2: 29 mmol/L (ref 22–32)
Calcium: 9.6 mg/dL (ref 8.9–10.3)
Chloride: 100 mmol/L (ref 98–111)
Creatinine, Ser: 1 mg/dL (ref 0.44–1.00)
GFR, Estimated: 57 mL/min — ABNORMAL LOW
Glucose, Bld: 93 mg/dL (ref 70–99)
Potassium: 4.2 mmol/L (ref 3.5–5.1)
Sodium: 136 mmol/L (ref 135–145)

## 2024-08-10 LAB — BPAM RBC
Blood Product Expiration Date: 202602132359
ISSUE DATE / TIME: 202601151246
Unit Type and Rh: 6200

## 2024-08-10 LAB — TYPE AND SCREEN
ABO/RH(D): A POS
Antibody Screen: NEGATIVE
Unit division: 0

## 2024-08-10 LAB — CYTOLOGY - NON PAP

## 2024-08-10 MED ORDER — HYDRALAZINE HCL 20 MG/ML IJ SOLN
5.0000 mg | Freq: Once | INTRAMUSCULAR | Status: AC
  Administered 2024-08-10: 5 mg via INTRAVENOUS
  Filled 2024-08-10: qty 1

## 2024-08-10 MED ORDER — ENOXAPARIN SODIUM 40 MG/0.4ML IJ SOSY
40.0000 mg | PREFILLED_SYRINGE | INTRAMUSCULAR | Status: DC
  Administered 2024-08-10 – 2024-08-15 (×6): 40 mg via SUBCUTANEOUS
  Filled 2024-08-10 (×6): qty 0.4

## 2024-08-10 NOTE — Plan of Care (Signed)
" °  Problem: Education: Goal: Knowledge of General Education information will improve Description: Including pain rating scale, medication(s)/side effects and non-pharmacologic comfort measures Outcome: Progressing   Problem: Clinical Measurements: Goal: Ability to maintain clinical measurements within normal limits will improve Outcome: Progressing Goal: Will remain free from infection Outcome: Progressing Goal: Diagnostic test results will improve Outcome: Progressing Goal: Respiratory complications will improve Outcome: Progressing Goal: Cardiovascular complication will be avoided Outcome: Progressing   Problem: Activity: Goal: Risk for activity intolerance will decrease Outcome: Progressing   Problem: Nutrition: Goal: Adequate nutrition will be maintained Outcome: Progressing   Problem: Coping: Goal: Level of anxiety will decrease Outcome: Progressing   Problem: Pain Managment: Goal: General experience of comfort will improve and/or be controlled Outcome: Progressing   Problem: Safety: Goal: Ability to remain free from injury will improve Outcome: Progressing   Problem: Skin Integrity: Goal: Risk for impaired skin integrity will decrease Outcome: Progressing   Problem: Education: Goal: Knowledge of the prescribed therapeutic regimen will improve Outcome: Progressing   Problem: Bowel/Gastric: Goal: Gastrointestinal status for postoperative course will improve Outcome: Progressing   Problem: Cardiac: Goal: Ability to maintain an adequate cardiac output Outcome: Progressing   Problem: Nutritional: Goal: Will attain and maintain optimal nutritional status Outcome: Progressing   Problem: Neurological: Goal: Will regain or maintain usual level of consciousness Outcome: Progressing   Problem: Clinical Measurements: Goal: Ability to maintain clinical measurements within normal limits Outcome: Progressing Goal: Postoperative complications will be avoided or  minimized Outcome: Progressing   Problem: Respiratory: Goal: Will regain and/or maintain adequate ventilation Outcome: Progressing Goal: Respiratory status will improve Outcome: Progressing   Problem: Skin Integrity: Goal: Demonstrates signs of wound healing without infection Outcome: Progressing   Problem: Urinary Elimination: Goal: Will remain free from infection Outcome: Progressing   Problem: Education: Goal: Knowledge of the prescribed therapeutic regimen will improve Outcome: Progressing   Problem: Skin Integrity: Goal: Demonstration of wound healing without infection will improve Outcome: Progressing   "

## 2024-08-10 NOTE — Progress Notes (Signed)
 3 Days Post-Op Procedures (LRB): DIAGNOSTIC LAPAROSCOPY (N/A) LAPAROTOMY FOR RIGHT SALPINGO-OOPHORECTOMY (Right) RECTOSIGMOID RESECTION WITH LOW ANASTAMOSIS SMALL BOWEL RESECTION WITH ANASTAMOSIS VULVAR BIOPSIES  Subjective: Patient reports continued abdominal soreness. She continues to pass a small amount of flatus. No BM. No nausea or emesis reported. Denies chest pain, dyspnea. Tolerated clear liquids. Reports voiding went well after foley removal. Sat up in the chair yesterday and walked in the halls.   Objective: Vital signs in last 24 hours: Temp:  [98.5 F (36.9 C)-99.2 F (37.3 C)] 98.5 F (36.9 C) (01/16 0444) Pulse Rate:  [56-75] 75 (01/16 0444) Resp:  [15-17] 15 (01/16 0444) BP: (108-186)/(54-80) 173/73 (01/16 0444) SpO2:  [96 %-98 %] 96 % (01/16 0444) Last BM Date : 08/07/24  Intake/Output from previous day: 01/15 0701 - 01/16 0700 In: 1680 [P.O.:1320; Blood:360] Out: 950 [Urine:900; Drains:50]  Physical Examination: General: cooperative, no distress, and appears sleepy, slow to respond, talking softly Resp: clear to auscultation bilaterally Cardio: regular rate and rhythm, S1, S2 normal, no murmur, click, rub or gallop GI: incision: abdominal incision covered with a dry dressing, jp drain with serosanguinous drainage and abdomen soft, hypoactive bowel sounds, non-distended Extremities: extremities normal, atraumatic, no cyanosis or edema SCDs currently off  Labs: WBC/Hgb/Hct/Plts:  8.2/9.1/27.9/179 (01/16 0520) BUN/Cr/glu/ALT/AST/amyl/lip:  18/1.00/--/--/--/--/-- (01/16 0520)  Assessment: 79 y.o. s/p Procedures: DIAGNOSTIC LAPAROSCOPY LAPAROTOMY FOR RIGHT SALPINGO-OOPHORECTOMY RECTOSIGMOID RESECTION WITH LOW ANASTAMOSIS SMALL BOWEL RESECTION WITH ANASTAMOSIS VULVAR BIOPSIES: stable Pain:  Pain is well-controlled on PRN medications.  Heme: Hgb 9.1 and Hct 27.9 this am s/p transfusion of 1 unit PRBCs on 08/09/2024. Surgical EBL 500 cc. Continue to monitor with  labs. Drain with serosanguinous drainage.  ID: WBC 8.2. Given decadron  and intra-op antibiotics. Afebrile. Continue to monitor.  CV: Hypertension improving. Will continue to monitor, meds ordered. HR stable.  GI:  Tolerating po: yes to clears. Antiemetics ordered if needed. S/P RECTOSIGMOID RESECTION WITH LOW ANASTOMOSIS, SMALL BOWEL RESECTION WITH ANASTOMOSIS.   GU: Voiding after foley removal. Creatinine 1.0. Adequate urine reported overnight.  FEN: No critical values on am labs.   Prophylaxis: Lovenox  and SCDs ordered.  Plan: Plan for diet advancement since passing some flatus Continue to monitor intake and output Increase mobility/IS Continue with current plan of care   LOS: 3 days    Eleanor JONETTA Epps 08/10/2024, 8:42 AM

## 2024-08-10 NOTE — Progress Notes (Signed)
 GYN Oncology Progress Note  Patient is alert, oriented, resting in bed.  States she has not been out of bed today.  Agreeable to try to get to the chair.  Patient assisted to the chair with walker and tolerated this without difficulty.  Reports mild feelings of weakness when getting out of bed.  States she has been emptying her bladder without difficulty.  No flatus reported.  Reports having a decreased appetite.  Not used incentive spirometer today.  Reinstructed patient on use.  Reapplied SCDs.  Blood pressure is decreasing after one-time hydralazine  dose.  Continue with current plan of care.

## 2024-08-10 NOTE — Progress Notes (Signed)
 Pt is laying in bed calm and cooperative she has c/o ABD pain see MAR for medication given JP drain was drained and ml put in chart surgical site looks good no drainage or odor no swelling in legs will continue to monitor for pain

## 2024-08-10 NOTE — Progress Notes (Signed)
 Pt B/P 180/91 will let doctor know

## 2024-08-10 NOTE — Care Management Important Message (Signed)
 Important Message  Patient Details IM Letter given. Name: [name] MRN: [id number] Date of Birth: 01-23-1946   Important Message Given:  Yes - Medicare IM     Melba Ates 08/10/2024, 2:54 PM

## 2024-08-11 LAB — BASIC METABOLIC PANEL WITH GFR
Anion gap: 8 (ref 5–15)
BUN: 21 mg/dL (ref 8–23)
CO2: 27 mmol/L (ref 22–32)
Calcium: 9.4 mg/dL (ref 8.9–10.3)
Chloride: 101 mmol/L (ref 98–111)
Creatinine, Ser: 0.91 mg/dL (ref 0.44–1.00)
GFR, Estimated: >60 mL/min
Glucose, Bld: 82 mg/dL (ref 70–99)
Potassium: 4.4 mmol/L (ref 3.5–5.1)
Sodium: 137 mmol/L (ref 135–145)

## 2024-08-11 LAB — CBC
HCT: 26.9 % — ABNORMAL LOW (ref 36.0–46.0)
Hemoglobin: 8.6 g/dL — ABNORMAL LOW (ref 12.0–15.0)
MCH: 29.3 pg (ref 26.0–34.0)
MCHC: 32 g/dL (ref 30.0–36.0)
MCV: 91.5 fL (ref 80.0–100.0)
Platelets: 194 K/uL (ref 150–400)
RBC: 2.94 MIL/uL — ABNORMAL LOW (ref 3.87–5.11)
RDW: 13.2 % (ref 11.5–15.5)
WBC: 7 K/uL (ref 4.0–10.5)
nRBC: 0 % (ref 0.0–0.2)

## 2024-08-11 NOTE — Progress Notes (Signed)
 Pt urine is notably brighter red and bloodier than the urine she had this morning.

## 2024-08-11 NOTE — Plan of Care (Signed)
 " Problem: Education: Goal: Knowledge of General Education information will improve Description: Including pain rating scale, medication(s)/side effects and non-pharmacologic comfort measures 08/11/2024 2315 by Rockford Leander BIRCH, RN Outcome: Progressing 08/11/2024 2315 by Rockford Leander BIRCH, RN Outcome: Progressing   Problem: Health Behavior/Discharge Planning: Goal: Ability to manage health-related needs will improve 08/11/2024 2315 by Rockford Leander BIRCH, RN Outcome: Progressing 08/11/2024 2315 by Rockford Leander BIRCH, RN Outcome: Progressing   Problem: Clinical Measurements: Goal: Ability to maintain clinical measurements within normal limits will improve 08/11/2024 2315 by Rockford Leander BIRCH, RN Outcome: Progressing 08/11/2024 2315 by Rockford Leander BIRCH, RN Outcome: Progressing Goal: Will remain free from infection 08/11/2024 2315 by Rockford Leander BIRCH, RN Outcome: Progressing 08/11/2024 2315 by Rockford Leander BIRCH, RN Outcome: Progressing Goal: Diagnostic test results will improve 08/11/2024 2315 by Rockford Leander BIRCH, RN Outcome: Progressing 08/11/2024 2315 by Rockford Leander D, RN Outcome: Progressing Goal: Respiratory complications will improve 08/11/2024 2315 by Rockford Leander BIRCH, RN Outcome: Progressing 08/11/2024 2315 by Rockford Leander D, RN Outcome: Progressing Goal: Cardiovascular complication will be avoided 08/11/2024 2315 by Rockford Leander BIRCH, RN Outcome: Progressing 08/11/2024 2315 by Rockford Leander BIRCH, RN Outcome: Progressing   Problem: Activity: Goal: Risk for activity intolerance will decrease 08/11/2024 2315 by Rockford Leander BIRCH, RN Outcome: Progressing 08/11/2024 2315 by Rockford Leander BIRCH, RN Outcome: Progressing   Problem: Nutrition: Goal: Adequate nutrition will be maintained 08/11/2024 2315 by Rockford Leander BIRCH, RN Outcome: Progressing 08/11/2024 2315 by Rockford Leander D, RN Outcome: Progressing   Problem: Coping: Goal: Level of anxiety will decrease 08/11/2024 2315 by Rockford Leander BIRCH, RN Outcome: Progressing 08/11/2024 2315 by Rockford Leander BIRCH, RN Outcome: Progressing   Problem: Elimination: Goal: Will not experience complications related to bowel motility 08/11/2024 2315 by Rockford Leander BIRCH, RN Outcome: Progressing 08/11/2024 2315 by Rockford Leander BIRCH, RN Outcome: Progressing Goal: Will not experience complications related to urinary retention 08/11/2024 2315 by Rockford Leander BIRCH, RN Outcome: Progressing 08/11/2024 2315 by Rockford Leander D, RN Outcome: Progressing   Problem: Pain Managment: Goal: General experience of comfort will improve and/or be controlled 08/11/2024 2315 by Rockford Leander BIRCH, RN Outcome: Progressing 08/11/2024 2315 by Rockford Leander BIRCH, RN Outcome: Progressing   Problem: Safety: Goal: Ability to remain free from injury will improve 08/11/2024 2315 by Rockford Leander BIRCH, RN Outcome: Progressing 08/11/2024 2315 by Rockford Leander D, RN Outcome: Progressing   Problem: Skin Integrity: Goal: Risk for impaired skin integrity will decrease 08/11/2024 2315 by Rockford Leander BIRCH, RN Outcome: Progressing 08/11/2024 2315 by Rockford Leander D, RN Outcome: Progressing   Problem: Education: Goal: Knowledge of the prescribed therapeutic regimen will improve 08/11/2024 2315 by Rockford Leander BIRCH, RN Outcome: Progressing 08/11/2024 2315 by Rockford Leander BIRCH, RN Outcome: Progressing   Problem: Bowel/Gastric: Goal: Gastrointestinal status for postoperative course will improve 08/11/2024 2315 by Rockford Leander BIRCH, RN Outcome: Progressing 08/11/2024 2315 by Rockford Leander BIRCH, RN Outcome: Progressing   Problem: Cardiac: Goal: Ability to maintain an adequate cardiac output 08/11/2024 2315 by Rockford Leander BIRCH, RN Outcome: Progressing 08/11/2024 2315 by Rockford Leander BIRCH, RN Outcome: Progressing Goal: Will show no evidence of cardiac arrhythmias 08/11/2024 2315 by Rockford Leander BIRCH, RN Outcome: Progressing 08/11/2024 2315 by Rockford Leander D, RN Outcome:  Progressing   Problem: Nutritional: Goal: Will attain and maintain optimal nutritional status 08/11/2024 2315 by Rockford Leander BIRCH, RN Outcome: Progressing 08/11/2024 2315 by Rockford Leander BIRCH, RN Outcome: Progressing   Problem: Neurological: Goal: Will regain or maintain usual level of  consciousness 08/11/2024 2315 by Rockford Leander BIRCH, RN Outcome: Progressing 08/11/2024 2315 by Rockford Leander BIRCH, RN Outcome: Progressing   Problem: Clinical Measurements: Goal: Ability to maintain clinical measurements within normal limits 08/11/2024 2315 by Rockford Leander BIRCH, RN Outcome: Progressing 08/11/2024 2315 by Rockford Leander BIRCH, RN Outcome: Progressing Goal: Postoperative complications will be avoided or minimized 08/11/2024 2315 by Rockford Leander BIRCH, RN Outcome: Progressing 08/11/2024 2315 by Rockford Leander BIRCH, RN Outcome: Progressing   Problem: Respiratory: Goal: Will regain and/or maintain adequate ventilation 08/11/2024 2315 by Rockford Leander BIRCH, RN Outcome: Progressing 08/11/2024 2315 by Rockford Leander BIRCH, RN Outcome: Progressing Goal: Respiratory status will improve 08/11/2024 2315 by Rockford Leander BIRCH, RN Outcome: Progressing 08/11/2024 2315 by Rockford Leander D, RN Outcome: Progressing   Problem: Skin Integrity: Goal: Demonstrates signs of wound healing without infection 08/11/2024 2315 by Rockford Leander BIRCH, RN Outcome: Progressing 08/11/2024 2315 by Rockford Leander D, RN Outcome: Progressing   Problem: Urinary Elimination: Goal: Will remain free from infection 08/11/2024 2315 by Rockford Leander BIRCH, RN Outcome: Progressing 08/11/2024 2315 by Rockford Leander BIRCH, RN Outcome: Progressing Goal: Ability to achieve and maintain adequate urine output 08/11/2024 2315 by Rockford Leander BIRCH, RN Outcome: Progressing 08/11/2024 2315 by Rockford Leander BIRCH, RN Outcome: Progressing   Problem: Education: Goal: Knowledge of the prescribed therapeutic regimen will improve 08/11/2024 2315 by Rockford Leander BIRCH,  RN Outcome: Progressing 08/11/2024 2315 by Rockford Leander BIRCH, RN Outcome: Progressing Goal: Understanding of sexual limitations or changes related to disease process or condition will improve 08/11/2024 2315 by Rockford Leander BIRCH, RN Outcome: Progressing 08/11/2024 2315 by Rockford Leander BIRCH, RN Outcome: Progressing Goal: Individualized Educational Video(s) 08/11/2024 2315 by Rockford Leander BIRCH, RN Outcome: Progressing 08/11/2024 2315 by Rockford Leander D, RN Outcome: Progressing   Problem: Self-Concept: Goal: Communication of feelings regarding changes in body function or appearance will improve 08/11/2024 2315 by Rockford Leander BIRCH, RN Outcome: Progressing 08/11/2024 2315 by Rockford Leander D, RN Outcome: Progressing   Problem: Skin Integrity: Goal: Demonstration of wound healing without infection will improve 08/11/2024 2315 by Rockford Leander BIRCH, RN Outcome: Progressing 08/11/2024 2315 by Rockford Leander BIRCH, RN Outcome: Progressing   "

## 2024-08-11 NOTE — Plan of Care (Signed)

## 2024-08-11 NOTE — Progress Notes (Signed)
 4 Days Post-Op Procedures (LRB): DIAGNOSTIC LAPAROSCOPY (N/A) LAPAROTOMY FOR RIGHT SALPINGO-OOPHORECTOMY (Right) RECTOSIGMOID RESECTION WITH LOW ANASTAMOSIS SMALL BOWEL RESECTION WITH ANASTAMOSIS VULVAR BIOPSIES  Subjective:  Large PVL yesterday, less today per pt's granddaughter.  -N/V/BM  Objective: Vital signs in last 24 hours: Temp:  [98.6 F (37 C)-98.8 F (37.1 C)] 98.8 F (37.1 C) (01/17 0555) Pulse Rate:  [70-87] 70 (01/17 0555) Resp:  [16-18] 18 (01/17 0555) BP: (147-180)/(76-91) 147/76 (01/17 0555) SpO2:  [97 %-100 %] 99 % (01/17 0555) Last BM Date : 08/07/24  Intake/Output from previous day: 01/16 0701 - 01/17 0700 In: 960 [P.O.:960] Out: 1055 [Urine:700; Drains:355]  Physical Examination: General: cooperative, no distress, and appears sleepy, slow to respond, talking softly Resp: clear to auscultation bilaterally Cardio: regular rate and rhythm, S1, S2 normal, no murmur, click, rub or gallop GI: incision: lower--superficial separation, serosanguinous drainage, jp drain with serosanguinous drainage and abdomen soft, hypoactive bowel sounds, non-distended Extremities: extremities normal, atraumatic, no cyanosis or edema SCDs currently off  Labs: WBC/Hgb/Hct/Plts:  7.0/8.6/26.9/194 (01/17 0502) BUN/Cr/glu/ALT/AST/amyl/lip:  21/0.91/--/--/--/--/-- (01/17 0502)  Assessment: 79 y.o. s/p Procedures: DIAGNOSTIC LAPAROSCOPY LAPAROTOMY FOR RIGHT SALPINGO-OOPHORECTOMY RECTOSIGMOID RESECTION WITH LOW ANASTAMOSIS SMALL BOWEL RESECTION WITH ANASTAMOSIS VULVAR BIOPSIES: stable Pain:  Pain is well-controlled on PRN medications.  Heme: Hgb 9.1 and Hct 27.9 this am s/p transfusion of 1 unit PRBCs on 08/09/2024. Surgical EBL 500 cc. Continue to monitor with labs. Drain with serosanguinous drainage.  ID: WBC downward trending.  CV: Hypertension improving. Will continue to monitor, meds ordered. HR stable.  GI:  Tolerating po: yes. Awaiting full return of bowel  function  FEN: No critical values on am labs.   Prophylaxis: Lovenox  and SCDs ordered.  Plan: Increase mobility/IS Continue with current plan of care   LOS: 4 days    Olam Mill 08/11/2024, 10:21 AM

## 2024-08-11 NOTE — Plan of Care (Signed)
" °  Problem: Activity: Goal: Risk for activity intolerance will decrease 08/11/2024 2316 by Rockford Leander BIRCH, RN Outcome: Progressing 08/11/2024 2315 by Rockford Leander BIRCH, RN Outcome: Progressing 08/11/2024 2315 by Rockford Leander BIRCH, RN Outcome: Progressing 08/11/2024 2315 by Rockford Leander BIRCH, RN Outcome: Progressing   "

## 2024-08-11 NOTE — Plan of Care (Signed)
" °  Problem: Education: Goal: Knowledge of General Education information will improve Description: Including pain rating scale, medication(s)/side effects and non-pharmacologic comfort measures Outcome: Progressing   Problem: Health Behavior/Discharge Planning: Goal: Ability to manage health-related needs will improve Outcome: Progressing   Problem: Clinical Measurements: Goal: Ability to maintain clinical measurements within normal limits will improve Outcome: Progressing Goal: Will remain free from infection Outcome: Progressing Goal: Diagnostic test results will improve Outcome: Progressing Goal: Respiratory complications will improve Outcome: Progressing Goal: Cardiovascular complication will be avoided Outcome: Progressing   Problem: Activity: Goal: Risk for activity intolerance will decrease Outcome: Progressing   Problem: Nutrition: Goal: Adequate nutrition will be maintained Outcome: Progressing   Problem: Coping: Goal: Level of anxiety will decrease Outcome: Progressing   Problem: Elimination: Goal: Will not experience complications related to bowel motility Outcome: Progressing Goal: Will not experience complications related to urinary retention Outcome: Progressing   Problem: Pain Managment: Goal: General experience of comfort will improve and/or be controlled Outcome: Progressing   Problem: Safety: Goal: Ability to remain free from injury will improve Outcome: Progressing   Problem: Skin Integrity: Goal: Risk for impaired skin integrity will decrease Outcome: Progressing   Problem: Education: Goal: Knowledge of the prescribed therapeutic regimen will improve Outcome: Progressing   Problem: Bowel/Gastric: Goal: Gastrointestinal status for postoperative course will improve Outcome: Progressing   Problem: Cardiac: Goal: Ability to maintain an adequate cardiac output Outcome: Progressing Goal: Will show no evidence of cardiac arrhythmias Outcome:  Progressing   Problem: Nutritional: Goal: Will attain and maintain optimal nutritional status Outcome: Progressing   Problem: Neurological: Goal: Will regain or maintain usual level of consciousness Outcome: Progressing   Problem: Clinical Measurements: Goal: Ability to maintain clinical measurements within normal limits Outcome: Progressing Goal: Postoperative complications will be avoided or minimized Outcome: Progressing   Problem: Respiratory: Goal: Will regain and/or maintain adequate ventilation Outcome: Progressing Goal: Respiratory status will improve Outcome: Progressing   Problem: Skin Integrity: Goal: Demonstrates signs of wound healing without infection Outcome: Progressing   Problem: Urinary Elimination: Goal: Will remain free from infection Outcome: Progressing Goal: Ability to achieve and maintain adequate urine output Outcome: Progressing   Problem: Education: Goal: Knowledge of the prescribed therapeutic regimen will improve Outcome: Progressing Goal: Understanding of sexual limitations or changes related to disease process or condition will improve Outcome: Progressing Goal: Individualized Educational Video(s) Outcome: Progressing   Problem: Self-Concept: Goal: Communication of feelings regarding changes in body function or appearance will improve Outcome: Progressing   Problem: Skin Integrity: Goal: Demonstration of wound healing without infection will improve Outcome: Progressing   "

## 2024-08-11 NOTE — Plan of Care (Signed)
 " Problem: Education: Goal: Knowledge of General Education information will improve Description: Including pain rating scale, medication(s)/side effects and non-pharmacologic comfort measures 08/11/2024 2315 by Tammy Leander BIRCH, RN Outcome: Progressing 08/11/2024 2315 by Tammy Leander BIRCH, RN Outcome: Progressing 08/11/2024 2315 by Tammy Leander BIRCH, RN Outcome: Progressing   Problem: Health Behavior/Discharge Planning: Goal: Ability to manage health-related needs will improve 08/11/2024 2315 by Tammy Leander BIRCH, RN Outcome: Progressing 08/11/2024 2315 by Tammy Leander BIRCH, RN Outcome: Progressing 08/11/2024 2315 by Tammy Leander BIRCH, RN Outcome: Progressing   Problem: Clinical Measurements: Goal: Ability to maintain clinical measurements within normal limits will improve 08/11/2024 2315 by Tammy Leander BIRCH, RN Outcome: Progressing 08/11/2024 2315 by Tammy Leander BIRCH, RN Outcome: Progressing 08/11/2024 2315 by Tammy Leander D, RN Outcome: Progressing Goal: Will remain free from infection 08/11/2024 2315 by Tammy Leander BIRCH, RN Outcome: Progressing 08/11/2024 2315 by Tammy Leander BIRCH, RN Outcome: Progressing 08/11/2024 2315 by Tammy Leander D, RN Outcome: Progressing Goal: Diagnostic test results will improve 08/11/2024 2315 by Tammy Leander BIRCH, RN Outcome: Progressing 08/11/2024 2315 by Tammy Leander BIRCH, RN Outcome: Progressing 08/11/2024 2315 by Tammy Leander D, RN Outcome: Progressing Goal: Respiratory complications will improve 08/11/2024 2315 by Tammy Leander BIRCH, RN Outcome: Progressing 08/11/2024 2315 by Tammy Leander BIRCH, RN Outcome: Progressing 08/11/2024 2315 by Tammy Leander D, RN Outcome: Progressing Goal: Cardiovascular complication will be avoided 08/11/2024 2315 by Tammy Leander BIRCH, RN Outcome: Progressing 08/11/2024 2315 by Tammy Leander BIRCH, RN Outcome: Progressing 08/11/2024 2315 by Tammy Leander BIRCH, RN Outcome: Progressing   Problem: Activity: Goal: Risk for  activity intolerance will decrease 08/11/2024 2315 by Tammy Leander BIRCH, RN Outcome: Progressing 08/11/2024 2315 by Tammy Leander BIRCH, RN Outcome: Progressing 08/11/2024 2315 by Tammy Leander BIRCH, RN Outcome: Progressing   Problem: Nutrition: Goal: Adequate nutrition will be maintained 08/11/2024 2315 by Tammy Leander BIRCH, RN Outcome: Progressing 08/11/2024 2315 by Tammy Leander BIRCH, RN Outcome: Progressing 08/11/2024 2315 by Tammy Leander D, RN Outcome: Progressing   Problem: Coping: Goal: Level of anxiety will decrease 08/11/2024 2315 by Tammy Leander BIRCH, RN Outcome: Progressing 08/11/2024 2315 by Tammy Leander BIRCH, RN Outcome: Progressing 08/11/2024 2315 by Tammy Leander BIRCH, RN Outcome: Progressing   Problem: Elimination: Goal: Will not experience complications related to bowel motility 08/11/2024 2315 by Tammy Leander BIRCH, RN Outcome: Progressing 08/11/2024 2315 by Tammy Leander BIRCH, RN Outcome: Progressing 08/11/2024 2315 by Tammy Leander BIRCH, RN Outcome: Progressing Goal: Will not experience complications related to urinary retention 08/11/2024 2315 by Tammy Leander BIRCH, RN Outcome: Progressing 08/11/2024 2315 by Tammy Leander BIRCH, RN Outcome: Progressing 08/11/2024 2315 by Tammy Leander D, RN Outcome: Progressing   Problem: Pain Managment: Goal: General experience of comfort will improve and/or be controlled 08/11/2024 2315 by Tammy Leander BIRCH, RN Outcome: Progressing 08/11/2024 2315 by Tammy Leander BIRCH, RN Outcome: Progressing 08/11/2024 2315 by Tammy Leander BIRCH, RN Outcome: Progressing   Problem: Safety: Goal: Ability to remain free from injury will improve 08/11/2024 2315 by Tammy Leander BIRCH, RN Outcome: Progressing 08/11/2024 2315 by Tammy Leander BIRCH, RN Outcome: Progressing 08/11/2024 2315 by Tammy Leander D, RN Outcome: Progressing   Problem: Skin Integrity: Goal: Risk for impaired skin integrity will decrease 08/11/2024 2315 by Tammy Leander BIRCH, RN Outcome:  Progressing 08/11/2024 2315 by Tammy Leander BIRCH, RN Outcome: Progressing 08/11/2024 2315 by Tammy Leander BIRCH, RN Outcome: Progressing   Problem: Education: Goal: Knowledge of the prescribed therapeutic regimen will improve 08/11/2024 2315 by Tammy Leander BIRCH, RN Outcome: Progressing 08/11/2024 2315  by Tammy Leander BIRCH, RN Outcome: Progressing 08/11/2024 2315 by Tammy Leander BIRCH, RN Outcome: Progressing   Problem: Bowel/Gastric: Goal: Gastrointestinal status for postoperative course will improve 08/11/2024 2315 by Tammy Leander BIRCH, RN Outcome: Progressing 08/11/2024 2315 by Tammy Leander BIRCH, RN Outcome: Progressing 08/11/2024 2315 by Tammy Leander BIRCH, RN Outcome: Progressing   Problem: Cardiac: Goal: Ability to maintain an adequate cardiac output 08/11/2024 2315 by Tammy Leander BIRCH, RN Outcome: Progressing 08/11/2024 2315 by Tammy Leander BIRCH, RN Outcome: Progressing 08/11/2024 2315 by Tammy Leander BIRCH, RN Outcome: Progressing Goal: Will show no evidence of cardiac arrhythmias 08/11/2024 2315 by Tammy Leander BIRCH, RN Outcome: Progressing 08/11/2024 2315 by Tammy Leander BIRCH, RN Outcome: Progressing 08/11/2024 2315 by Tammy Leander BIRCH, RN Outcome: Progressing   Problem: Nutritional: Goal: Will attain and maintain optimal nutritional status 08/11/2024 2315 by Tammy Leander BIRCH, RN Outcome: Progressing 08/11/2024 2315 by Tammy Leander BIRCH, RN Outcome: Progressing 08/11/2024 2315 by Tammy Leander BIRCH, RN Outcome: Progressing   Problem: Neurological: Goal: Will regain or maintain usual level of consciousness 08/11/2024 2315 by Tammy Leander BIRCH, RN Outcome: Progressing 08/11/2024 2315 by Tammy Leander BIRCH, RN Outcome: Progressing 08/11/2024 2315 by Tammy Leander BIRCH, RN Outcome: Progressing   Problem: Clinical Measurements: Goal: Ability to maintain clinical measurements within normal limits 08/11/2024 2315 by Tammy Leander BIRCH, RN Outcome: Progressing 08/11/2024 2315 by Tammy Leander BIRCH,  RN Outcome: Progressing 08/11/2024 2315 by Tammy Leander BIRCH, RN Outcome: Progressing Goal: Postoperative complications will be avoided or minimized 08/11/2024 2315 by Tammy Leander BIRCH, RN Outcome: Progressing 08/11/2024 2315 by Tammy Leander BIRCH, RN Outcome: Progressing 08/11/2024 2315 by Tammy Leander D, RN Outcome: Progressing   Problem: Respiratory: Goal: Will regain and/or maintain adequate ventilation 08/11/2024 2315 by Tammy Leander BIRCH, RN Outcome: Progressing 08/11/2024 2315 by Tammy Leander BIRCH, RN Outcome: Progressing 08/11/2024 2315 by Tammy Leander D, RN Outcome: Progressing Goal: Respiratory status will improve 08/11/2024 2315 by Tammy Leander BIRCH, RN Outcome: Progressing 08/11/2024 2315 by Tammy Leander BIRCH, RN Outcome: Progressing 08/11/2024 2315 by Tammy Leander D, RN Outcome: Progressing   Problem: Skin Integrity: Goal: Demonstrates signs of wound healing without infection 08/11/2024 2315 by Tammy Leander BIRCH, RN Outcome: Progressing 08/11/2024 2315 by Tammy Leander BIRCH, RN Outcome: Progressing 08/11/2024 2315 by Tammy Leander D, RN Outcome: Progressing   Problem: Urinary Elimination: Goal: Will remain free from infection 08/11/2024 2315 by Tammy Leander BIRCH, RN Outcome: Progressing 08/11/2024 2315 by Tammy Leander BIRCH, RN Outcome: Progressing 08/11/2024 2315 by Tammy Leander BIRCH, RN Outcome: Progressing Goal: Ability to achieve and maintain adequate urine output 08/11/2024 2315 by Tammy Leander BIRCH, RN Outcome: Progressing 08/11/2024 2315 by Tammy Leander BIRCH, RN Outcome: Progressing 08/11/2024 2315 by Tammy Leander BIRCH, RN Outcome: Progressing   Problem: Education: Goal: Knowledge of the prescribed therapeutic regimen will improve 08/11/2024 2315 by Tammy Leander BIRCH, RN Outcome: Progressing 08/11/2024 2315 by Tammy Leander BIRCH, RN Outcome: Progressing 08/11/2024 2315 by Tammy Leander BIRCH, RN Outcome: Progressing Goal: Understanding of sexual limitations or changes  related to disease process or condition will improve 08/11/2024 2315 by Tammy Leander BIRCH, RN Outcome: Progressing 08/11/2024 2315 by Tammy Leander BIRCH, RN Outcome: Progressing 08/11/2024 2315 by Tammy Leander BIRCH, RN Outcome: Progressing Goal: Individualized Educational Video(s) 08/11/2024 2315 by Tammy Leander BIRCH, RN Outcome: Progressing 08/11/2024 2315 by Tammy Leander BIRCH, RN Outcome: Progressing 08/11/2024 2315 by Tammy Leander BIRCH, RN Outcome: Progressing   Problem: Self-Concept: Goal: Communication of feelings regarding changes in body function or appearance will improve  08/11/2024 2315 by Tammy Leander BIRCH, RN Outcome: Progressing 08/11/2024 2315 by Tammy Leander BIRCH, RN Outcome: Progressing 08/11/2024 2315 by Tammy Leander D, RN Outcome: Progressing   Problem: Skin Integrity: Goal: Demonstration of wound healing without infection will improve 08/11/2024 2315 by Tammy Leander BIRCH, RN Outcome: Progressing 08/11/2024 2315 by Tammy Leander BIRCH, RN Outcome: Progressing 08/11/2024 2315 by Tammy Leander BIRCH, RN Outcome: Progressing   "

## 2024-08-12 LAB — BASIC METABOLIC PANEL WITH GFR
Anion gap: 7 (ref 5–15)
Anion gap: 6 (ref 5–15)
BUN: 26 mg/dL — ABNORMAL HIGH (ref 8–23)
BUN: 27 mg/dL — ABNORMAL HIGH (ref 8–23)
CO2: 26 mmol/L (ref 22–32)
CO2: 27 mmol/L (ref 22–32)
Calcium: 9.5 mg/dL (ref 8.9–10.3)
Calcium: 9.5 mg/dL (ref 8.9–10.3)
Chloride: 101 mmol/L (ref 98–111)
Chloride: 102 mmol/L (ref 98–111)
Creatinine, Ser: 1.21 mg/dL — ABNORMAL HIGH (ref 0.44–1.00)
Creatinine, Ser: 1.08 mg/dL — ABNORMAL HIGH (ref 0.44–1.00)
GFR, Estimated: 45 mL/min — ABNORMAL LOW
GFR, Estimated: 52 mL/min — ABNORMAL LOW
Glucose, Bld: 90 mg/dL (ref 70–99)
Glucose, Bld: 100 mg/dL — ABNORMAL HIGH (ref 70–99)
Potassium: 3.9 mmol/L (ref 3.5–5.1)
Potassium: 4.2 mmol/L (ref 3.5–5.1)
Sodium: 134 mmol/L — ABNORMAL LOW (ref 135–145)
Sodium: 135 mmol/L (ref 135–145)

## 2024-08-12 LAB — CBC
HCT: 24.8 % — ABNORMAL LOW (ref 36.0–46.0)
Hemoglobin: 8.1 g/dL — ABNORMAL LOW (ref 12.0–15.0)
MCH: 29.9 pg (ref 26.0–34.0)
MCHC: 32.7 g/dL (ref 30.0–36.0)
MCV: 91.5 fL (ref 80.0–100.0)
Platelets: 225 K/uL (ref 150–400)
RBC: 2.71 MIL/uL — ABNORMAL LOW (ref 3.87–5.11)
RDW: 13.2 % (ref 11.5–15.5)
WBC: 6.2 K/uL (ref 4.0–10.5)
nRBC: 0 % (ref 0.0–0.2)

## 2024-08-12 MED ORDER — LABETALOL HCL 100 MG PO TABS: 200.0000 mg | ORAL_TABLET | Freq: Two times a day (BID) | ORAL | Status: DC | PRN

## 2024-08-12 NOTE — Progress Notes (Signed)
 5 Days Post-Op Procedures (LRB): DIAGNOSTIC LAPAROSCOPY (N/A) LAPAROTOMY FOR RIGHT SALPINGO-OOPHORECTOMY (Right) RECTOSIGMOID RESECTION WITH LOW ANASTAMOSIS SMALL BOWEL RESECTION WITH ANASTAMOSIS VULVAR BIOPSIES  Subjective: Minimal PVL today per pt's granddaughter.  -N/V. +BM  Objective: Vital signs in last 24 hours: Temp:  [97.4 F (36.3 C)-99.3 F (37.4 C)] 97.4 F (36.3 C) (01/18 0559) Pulse Rate:  [59-75] 63 (01/18 0559) Resp:  [14-18] 18 (01/18 0559) BP: (90-176)/(56-77) 176/77 (01/18 0559) SpO2:  [98 %-100 %] 99 % (01/18 0559) Last BM Date : 08/07/24  Intake/Output from previous day: 01/17 0701 - 01/18 0700 In: 960 [P.O.:960] Out: 445 [Urine:300; Drains:145]  Physical Examination: General: cooperative, no distress, and appears sleepy, slow to respond, talking softly Resp: clear to auscultation bilaterally Cardio: regular rate and rhythm, S1, S2 normal, no murmur, click, rub or gallop GI: incision: lower--superficial separation, serosanguinous drainage, jp drain with serosanguinous drainage and abdomen soft, normoactive bowel sounds, non-distended Extremities: extremities normal, atraumatic, no cyanosis or edema SCDs currently off  Labs: WBC/Hgb/Hct/Plts:  6.2/8.1/24.8/225 (01/18 0505) BUN/Cr/glu/ALT/AST/amyl/lip:  26/1.21/--/--/--/--/-- (01/18 0505)  Assessment: 79 y.o. s/p Procedures: DIAGNOSTIC LAPAROSCOPY LAPAROTOMY FOR RIGHT SALPINGO-OOPHORECTOMY RECTOSIGMOID RESECTION WITH LOW ANASTAMOSIS SMALL BOWEL RESECTION WITH ANASTAMOSIS VULVAR BIOPSIES: stable Pain:  Pain is well-controlled on PRN medications.  Heme: Postop anemia--stable.  Less PVL  CV: Labile B/P  GI:  Tolerating po: yes. Awaiting full return of bowel function  Renal: Upward trend in Cr  FEN: No significant metabolic derangements  Prophylaxis: Lovenox  and SCDs ordered. CrCl > 30  Plan: Increase mobility/IS Hold the ARB for now; Labetalol  as tolerated prn Recheck BMET today OT/PT/care  management referral requests placed   LOS: 5 days    Olam Mill, MD 08/12/2024, 12:32 PM

## 2024-08-12 NOTE — Plan of Care (Signed)
" °  Problem: Skin Integrity: Goal: Demonstrates signs of wound healing without infection Outcome: Progressing   Problem: Urinary Elimination: Goal: Will remain free from infection Outcome: Progressing Goal: Ability to achieve and maintain adequate urine output Outcome: Progressing   Problem: Education: Goal: Knowledge of the prescribed therapeutic regimen will improve Outcome: Progressing Goal: Understanding of sexual limitations or changes related to disease process or condition will improve Outcome: Progressing Goal: Individualized Educational Video(s) Outcome: Progressing   Problem: Self-Concept: Goal: Communication of feelings regarding changes in body function or appearance will improve Outcome: Progressing   "

## 2024-08-12 NOTE — Plan of Care (Signed)
" °  Problem: Education: Goal: Knowledge of General Education information will improve Description: Including pain rating scale, medication(s)/side effects and non-pharmacologic comfort measures Outcome: Progressing   Problem: Health Behavior/Discharge Planning: Goal: Ability to manage health-related needs will improve Outcome: Progressing   Problem: Clinical Measurements: Goal: Ability to maintain clinical measurements within normal limits will improve Outcome: Progressing Goal: Will remain free from infection Outcome: Progressing Goal: Diagnostic test results will improve Outcome: Progressing Goal: Respiratory complications will improve Outcome: Progressing Goal: Cardiovascular complication will be avoided Outcome: Progressing   Problem: Activity: Goal: Risk for activity intolerance will decrease Outcome: Progressing   Problem: Nutrition: Goal: Adequate nutrition will be maintained Outcome: Progressing   Problem: Coping: Goal: Level of anxiety will decrease Outcome: Progressing   Problem: Elimination: Goal: Will not experience complications related to bowel motility Outcome: Progressing Goal: Will not experience complications related to urinary retention Outcome: Progressing   Problem: Pain Managment: Goal: General experience of comfort will improve and/or be controlled Outcome: Progressing   Problem: Safety: Goal: Ability to remain free from injury will improve Outcome: Progressing   Problem: Skin Integrity: Goal: Risk for impaired skin integrity will decrease Outcome: Progressing   Problem: Education: Goal: Knowledge of the prescribed therapeutic regimen will improve Outcome: Progressing   Problem: Bowel/Gastric: Goal: Gastrointestinal status for postoperative course will improve Outcome: Progressing   Problem: Cardiac: Goal: Ability to maintain an adequate cardiac output Outcome: Progressing Goal: Will show no evidence of cardiac arrhythmias Outcome:  Progressing   Problem: Nutritional: Goal: Will attain and maintain optimal nutritional status Outcome: Progressing   Problem: Neurological: Goal: Will regain or maintain usual level of consciousness Outcome: Progressing   Problem: Clinical Measurements: Goal: Ability to maintain clinical measurements within normal limits Outcome: Progressing Goal: Postoperative complications will be avoided or minimized Outcome: Progressing   Problem: Respiratory: Goal: Will regain and/or maintain adequate ventilation Outcome: Progressing Goal: Respiratory status will improve Outcome: Progressing   Problem: Skin Integrity: Goal: Demonstrates signs of wound healing without infection Outcome: Progressing   Problem: Urinary Elimination: Goal: Will remain free from infection Outcome: Progressing Goal: Ability to achieve and maintain adequate urine output Outcome: Progressing   Problem: Education: Goal: Knowledge of the prescribed therapeutic regimen will improve Outcome: Progressing Goal: Understanding of sexual limitations or changes related to disease process or condition will improve Outcome: Progressing Goal: Individualized Educational Video(s) Outcome: Progressing   Problem: Self-Concept: Goal: Communication of feelings regarding changes in body function or appearance will improve Outcome: Progressing   Problem: Skin Integrity: Goal: Demonstration of wound healing without infection will improve Outcome: Progressing   "

## 2024-08-13 ENCOUNTER — Inpatient Hospital Stay: Admitting: Psychiatry

## 2024-08-13 DIAGNOSIS — R630 Anorexia: Principal | ICD-10-CM

## 2024-08-13 LAB — BASIC METABOLIC PANEL WITH GFR
Anion gap: 6 (ref 5–15)
BUN: 26 mg/dL — ABNORMAL HIGH (ref 8–23)
CO2: 28 mmol/L (ref 22–32)
Calcium: 9.3 mg/dL (ref 8.9–10.3)
Chloride: 104 mmol/L (ref 98–111)
Creatinine, Ser: 0.93 mg/dL (ref 0.44–1.00)
GFR, Estimated: >60 mL/min
Glucose, Bld: 78 mg/dL (ref 70–99)
Potassium: 4.3 mmol/L (ref 3.5–5.1)
Sodium: 138 mmol/L (ref 135–145)

## 2024-08-13 LAB — CBC
HCT: 24.5 % — ABNORMAL LOW (ref 36.0–46.0)
Hemoglobin: 8 g/dL — ABNORMAL LOW (ref 12.0–15.0)
MCH: 30 pg (ref 26.0–34.0)
MCHC: 32.7 g/dL (ref 30.0–36.0)
MCV: 91.8 fL (ref 80.0–100.0)
Platelets: 224 K/uL (ref 150–400)
RBC: 2.67 MIL/uL — ABNORMAL LOW (ref 3.87–5.11)
RDW: 13.2 % (ref 11.5–15.5)
WBC: 5.9 K/uL (ref 4.0–10.5)
nRBC: 0 % (ref 0.0–0.2)

## 2024-08-13 MED ORDER — ENSURE PLUS HIGH PROTEIN PO LIQD: 237.0000 mL | Freq: Two times a day (BID) | ORAL | Status: DC

## 2024-08-13 MED ORDER — POLYETHYLENE GLYCOL 3350 17 G PO PACK
17.0000 g | PACK | Freq: Every day | ORAL | Status: DC
  Administered 2024-08-13 – 2024-08-16 (×4): 17 g via ORAL
  Filled 2024-08-13 (×4): qty 1

## 2024-08-13 NOTE — [place] FL2 (Signed)
 " [place]  MEDICAID FL2 LEVEL OF CARE FORM     IDENTIFICATION  Patient Name: [name] Birthdate: [birth year] Sex: female Admission Date (Current Location): 08/07/2024  [facility] and [place] Number:  Producer, Television/film/video and Address:  [facility],  [address] [place], [place] [address]      Provider Number: [id number]  Attending Physician Name and Address:  Eldonna Mays, MD  Relative Name and Phone Number:  merita Wallace Molt @ [phone]    Current Level of Care: Hospital Recommended Level of Care: Skilled Nursing Facility Prior Approval Number:    Date Approved/Denied:   PASRR Number: [id number]  Discharge Plan: SNF    Current Diagnoses: Patient Active Problem List   Diagnosis Date Noted   Pelvic mass 08/07/2024   Pelvic mass in female 08/07/2024   Vulvar lesion 08/07/2024   S/p reverse total shoulder arthroplasty 09/18/2015   Abnormal nuclear cardiac imaging test    Diabetes mellitus without complication (HCC) 04/22/2015   Depression 04/22/2015   Chest pain 04/21/2015   Chronic diastolic CHF (congestive heart failure) (HCC) 04/21/2015   CKD (chronic kidney disease), stage III (HCC)    Pain in the chest    Essential hypertension, benign 12/11/2014   Spinal stenosis of lumbar region 03/20/2013   Acute on chronic kidney failure 03/20/2013   (HFpEF) heart failure with preserved ejection fraction (HCC) 03/20/2013   Gout 03/20/2013   Anemia of chronic disease 03/20/2013   Trochanteric bursitis 03/19/2013   Lumbar spondylosis with myelopathy 02/27/2013   Giant cell arteritis (HCC) 12/23/2011   Foot pain, right    CAD (coronary artery disease)    GERD (gastroesophageal reflux disease)    Sleep apnea    PAD (peripheral artery disease)    Ejection fraction    Mitral regurgitation    FLUID OVERLOAD 06/04/2009   HYPOKALEMIA 06/04/2009   SHORTNESS OF BREATH 05/07/2009   DM 05/06/2009   HLD (hyperlipidemia) 05/06/2009   Overweight  05/06/2009   Essential hypertension 05/06/2009    Orientation RESPIRATION BLADDER Height & Weight     Self, Time, Situation, Place  Normal Continent Weight: 126 lb 9.6 oz (57.4 kg) Height:  5' 2 (157.5 cm)  BEHAVIORAL SYMPTOMS/MOOD NEUROLOGICAL BOWEL NUTRITION STATUS      Continent Diet (regular)  AMBULATORY STATUS COMMUNICATION OF NEEDS Skin   Limited Assist Verbally Other (Comment) (surgical incision; JP drain abdomen)                       Personal Care Assistance Level of Assistance  Bathing, Dressing Bathing Assistance: Limited assistance   Dressing Assistance: Limited assistance     Functional Limitations Info  Sight, Hearing, Speech Sight Info: Adequate Hearing Info: Adequate Speech Info: Adequate    SPECIAL CARE FACTORS FREQUENCY  PT (By licensed PT), OT (By licensed OT)     PT Frequency: 5x/wk OT Frequency: 5x/wk            Contractures Contractures Info: Not present    Additional Factors Info  Code Status, Allergies Code Status Info: Full Allergies Info: Latex, Amoxicillin           Current Medications (08/13/2024):  This is the current hospital active medication list Current Facility-Administered Medications  Medication Dose Route Frequency Provider Last Rate Last Admin   acetaminophen  (TYLENOL ) tablet 1,000 mg  1,000 mg Oral Q12H Rogelio Planas, MD   1,000 mg at 08/13/24 0850   atorvastatin  (LIPITOR) tablet 40 mg  40 mg Oral  QPM Rogelio Planas, MD   40 mg at 08/12/24 1700   carvedilol  (COREG ) tablet 6.25 mg  6.25 mg Oral BID Rogelio Planas, MD   6.25 mg at 08/13/24 9150   citalopram  (CELEXA ) tablet 20 mg  20 mg Oral QPM Rogelio Planas, MD   20 mg at 08/12/24 1700   enoxaparin  (LOVENOX ) injection 40 mg  40 mg Subcutaneous Q24H Cross, Melissa D, NP   40 mg at 08/12/24 1701   gabapentin  (NEURONTIN ) capsule 100 mg  100 mg Oral TID Rogelio Planas, MD   100 mg at 08/13/24 9150   HYDROmorphone  (DILAUDID ) injection 0.5 mg   0.5 mg Intravenous Q4H PRN Rogelio Planas, MD   0.5 mg at 08/11/24 0559   isosorbide  mononitrate (IMDUR ) 24 hr tablet 30 mg  30 mg Oral Daily Rogelio Planas, MD   30 mg at 08/13/24 9149   labetalol  (NORMODYNE ) tablet 200 mg  200 mg Oral BID PRN Rogelio Planas, MD       nitroGLYCERIN  (NITROSTAT ) SL tablet 0.4 mg  0.4 mg Sublingual Q5 min PRN Rogelio Planas, MD       ondansetron  (ZOFRAN ) tablet 4 mg  4 mg Oral Q6H PRN Rogelio Planas, MD       Or   ondansetron  (ZOFRAN ) injection 4 mg  4 mg Intravenous Q6H PRN Rogelio Planas, MD       oxyCODONE  (Oxy IR/ROXICODONE ) immediate release tablet 5 mg  5 mg Oral Q4H PRN Rogelio Planas, MD   5 mg at 08/13/24 9150   senna-docusate (Senokot-S) tablet 2 tablet  2 tablet Oral QHS Rogelio Planas, MD   2 tablet at 08/12/24 2213   traMADol  (ULTRAM ) tablet 100 mg  100 mg Oral Q12H PRN Rogelio Planas, MD   100 mg at 08/08/24 1318   traZODone  (DESYREL ) tablet 100 mg  100 mg Oral QHS Cross, Melissa D, NP   100 mg at 08/12/24 2213     Discharge Medications: Please see discharge summary for a list of discharge medications.  Relevant Imaging Results:  Relevant Lab Results:   Additional Information SS# [id number]  NORMAN ASPEN, LCSW     "

## 2024-08-13 NOTE — Evaluation (Signed)
 Occupational Therapy Evaluation Patient Details Name: [name] MRN: [id number] DOB: 1945/12/04 Today's Date: 08/13/2024   History of Present Illness   Patient is an 79 yo female presenting to the ED on 08/07/24 with a pelvic mass and with laparotomy, small bowel  and rectosigmoid resection, and vulvar biopsies. PMH includes: HTN, CAD, GERD, DM2, depression     Clinical Impressions Prior to this admission, patient living alone, seldomly driving, and endorsing increased falls in the last 2 to 3 months. Patient occasionally using a RW or cane, and with difficulty getting into the tub shower. Currently, patient with significant abdominal pain, and min A for bed mobility and transfers, and mod A for ADLs due to inability to complete lower body ADLs due to pain. OT recommending short term rehab stint < 3 hours prior to discharge home. OT will continue to follow acutely.      If plan is discharge home, recommend the following:   A little help with walking and/or transfers;A lot of help with bathing/dressing/bathroom;Assistance with cooking/housework;Assist for transportation     Functional Status Assessment   Patient has had a recent decline in their functional status and demonstrates the ability to make significant improvements in function in a reasonable and predictable amount of time.     Equipment Recommendations   Tub/shower bench;Other (comment) Aeronautical Engineer)     Recommendations for Other Services         Precautions/Restrictions   Precautions Precautions: Fall;Other (comment) Recall of Precautions/Restrictions: Intact Precaution/Restrictions Comments: JP drain Restrictions Weight Bearing Restrictions Per Provider Order: No     Mobility Bed Mobility Overal bed mobility: Needs Assistance Bed Mobility: Sidelying to Sit, Rolling Rolling: Supervision Sidelying to sit: Min assist       General bed mobility comments: Good log roll technique, increased cues and  assist in order to come up to sitting with min A    Transfers Overall transfer level: Needs assistance Equipment used: Rolling walker (2 wheels) Transfers: Sit to/from Stand, Bed to chair/wheelchair/BSC Sit to Stand: Min assist Stand pivot transfers: Min assist         General transfer comment: Min A to rise, and complete incremental stand pivot to recliner, unable to progress with mobility due to pain      Balance Overall balance assessment: Needs assistance Sitting-balance support: Bilateral upper extremity supported, Feet supported Sitting balance-Leahy Scale: Fair Sitting balance - Comments: did not challenge   Standing balance support: Bilateral upper extremity supported, During functional activity, Reliant on assistive device for balance Standing balance-Leahy Scale: Poor Standing balance comment: reliant on external assist                           ADL either performed or assessed with clinical judgement   ADL Overall ADL's : Needs assistance/impaired Eating/Feeding: Set up;Sitting   Grooming: Wash/dry face;Wash/dry hands;Oral care;Sitting;Set up   Upper Body Bathing: Contact guard assist;Sitting   Lower Body Bathing: Sitting/lateral leans;Sit to/from stand;Maximal assistance   Upper Body Dressing : Contact guard assist;Sitting   Lower Body Dressing: Maximal assistance;Sit to/from stand;Sitting/lateral leans   Toilet Transfer: Minimal assistance;Stand-pivot;BSC/3in1 Statistician Details (indicate cue type and reason): simulated to recliner Toileting- Clothing Manipulation and Hygiene: Contact guard assist;Sit to/from stand;Sitting/lateral lean       Functional mobility during ADLs: Moderate assistance;Rolling walker (2 wheels);Cueing for safety;Cueing for sequencing General ADL Comments: Prior to this admission, patient living alone, seldomly driving, and endorsing increased falls in the last 2  to 3 months. Patient occasionally using a RW or cane,  and with difficulty getting into the tub shower. Currently, patient with significant abdominal pain, and min A for bed mobility and transfers, and mod A for ADLs due to inability to complete lower body ADLs due to pain. OT recommending short term rehab stint < 3 hours prior to discharge home. OT will continue to follow acutely.     Vision Baseline Vision/History: 1 Wears glasses Ability to See in Adequate Light: 0 Adequate Patient Visual Report: No change from baseline Vision Assessment?: No apparent visual deficits     Perception Perception: Not tested       Praxis Praxis: Not tested       Pertinent Vitals/Pain Pain Assessment Pain Assessment: 0-10 Pain Score: 6  Pain Location: abdomen Pain Descriptors / Indicators: Discomfort, Grimacing, Guarding Pain Intervention(s): Limited activity within patient's tolerance, Monitored during session, Repositioned, Patient requesting pain meds-RN notified     Extremity/Trunk Assessment Upper Extremity Assessment Upper Extremity Assessment: Generalized weakness;Right hand dominant   Lower Extremity Assessment Lower Extremity Assessment: Defer to PT evaluation   Cervical / Trunk Assessment Cervical / Trunk Assessment: Kyphotic (minimally)   Communication Communication Communication: No apparent difficulties   Cognition Arousal: Alert Behavior During Therapy: WFL for tasks assessed/performed Cognition: No apparent impairments             OT - Cognition Comments: Slowed responses, likely due to pain                 Following commands: Intact       Cueing  General Comments   Cueing Techniques: Verbal cues  VSS on RA   Exercises     Shoulder Instructions      Home Living Family/patient expects to be discharged to:: Private residence Living Arrangements: Alone Available Help at Discharge: Available PRN/intermittently;Family Type of Home: Apartment Home Access: Level entry     Home Layout: One level      Bathroom Shower/Tub: Chief Strategy Officer: Standard     Home Equipment: Cane - single Librarian, Academic (2 wheels);Grab bars - tub/shower;Shower seat          Prior Functioning/Environment Prior Level of Function : Independent/Modified Independent;Driving;History of Falls (last six months)             Mobility Comments: has been falling for the last two the three months ADLs Comments: has been sponge bathing more recently    OT Problem List: Decreased strength;Decreased activity tolerance;Decreased range of motion;Impaired balance (sitting and/or standing);Decreased coordination;Decreased safety awareness;Decreased knowledge of use of DME or AE;Decreased knowledge of precautions;Pain   OT Treatment/Interventions: Self-care/ADL training;Therapeutic exercise;Energy conservation;DME and/or AE instruction;Manual therapy;Therapeutic activities;Patient/family education;Balance training      OT Goals(Current goals can be found in the care plan section)   Acute Rehab OT Goals Patient Stated Goal: to get better OT Goal Formulation: With patient/family Time For Goal Achievement: 08/27/24 Potential to Achieve Goals: Good ADL Goals Pt Will Perform Lower Body Bathing: with modified independence;sit to/from stand;sitting/lateral leans Pt Will Perform Lower Body Dressing: with modified independence;sitting/lateral leans;sit to/from stand Pt Will Transfer to Toilet: with modified independence;regular height toilet;ambulating Pt Will Perform Toileting - Clothing Manipulation and hygiene: with modified independence;sitting/lateral leans;sit to/from stand Pt/caregiver will Perform Home Exercise Program: Increased ROM;Increased strength;Both right and left upper extremity;With written HEP provided;With minimal assist Additional ADL Goal #1: Patient will be able to complete functional activity in standing for 3-5 minutes without seated rest break in  order to increase overall  activity tolerance.   OT Frequency:  Min 2X/week    Co-evaluation              AM-PAC OT 6 Clicks Daily Activity     Outcome Measure Help from another person eating meals?: A Little Help from another person taking care of personal grooming?: A Little Help from another person toileting, which includes using toliet, bedpan, or urinal?: A Lot Help from another person bathing (including washing, rinsing, drying)?: A Lot Help from another person to put on and taking off regular upper body clothing?: A Little Help from another person to put on and taking off regular lower body clothing?: A Lot 6 Click Score: 15   End of Session Equipment Utilized During Treatment: Rolling walker (2 wheels) Nurse Communication: Mobility status;Patient requests pain meds  Activity Tolerance: Patient limited by pain Patient left: in chair;with call bell/phone within reach;with family/visitor present  OT Visit Diagnosis: Unsteadiness on feet (R26.81);Other abnormalities of gait and mobility (R26.89);Repeated falls (R29.6);Muscle weakness (generalized) (M62.81);History of falling (Z91.81);Pain Pain - part of body:  (Abdomen)                Time: 9181-9155 OT Time Calculation (min): 26 min Charges:  OT General Charges $OT Visit: 1 Visit OT Evaluation $OT Eval Moderate Complexity: 1 Mod OT Treatments $Self Care/Home Management : 8-22 mins  Ronal Gift E. Aamya Orellana, OTR/L Acute Rehabilitation Services [phone]   Ronal Gift Salt 08/13/2024, 8:54 AM

## 2024-08-13 NOTE — Evaluation (Signed)
 Physical Therapy Evaluation Patient Details Name: [name] MRN: [id number] DOB: 1945-11-03 Today's Date: 08/13/2024  History of Present Illness  Patient is an 79 yo female presenting to the ED on 08/07/24 with a pelvic mass and with laparotomy, small bowel  and rectosigmoid resection, and vulvar biopsies. PMH includes: HTN, CAD, GERD, DM2, depression  Clinical Impression  PTA, pt was living alone, IND with mobility and driving short distances. She does report increased falls in the last 2-3 months with 3 being in the last 2 months and 2 included a head strike with no LOC. Pt also reports she is having more trouble remembering things which is why she doesn't drive places that are more than 15 minutes away because I sometimes forget where I am going. At evaluation, she requires MIN A for bed mobility and transfers with a RW, and tolerates gait distances up to 18ft at CGA. She is limited by abdominal pain which worsens with mobility, decreased generalized strength and endurance, limited caregiver support at discharge, and decreased functional independence below her baseline. PT recommends skilled therapy intervention at discharge for <3hrs/day.       If plan is discharge home, recommend the following: A little help with walking and/or transfers;A little help with bathing/dressing/bathroom;Assist for transportation;Help with stairs or ramp for entrance   Can travel by private vehicle   Yes    Equipment Recommendations None recommended by PT  Recommendations for Other Services       Functional Status Assessment Patient has had a recent decline in their functional status and demonstrates the ability to make significant improvements in function in a reasonable and predictable amount of time.     Precautions / Restrictions Precautions Precautions: Fall;Other (comment) Recall of Precautions/Restrictions: Intact Precaution/Restrictions Comments: JP drain Restrictions Weight Bearing  Restrictions Per Provider Order: No      Mobility  Bed Mobility Overal bed mobility: Needs Assistance Bed Mobility: Sidelying to Sit, Rolling Rolling: Supervision Sidelying to sit: Min assist       General bed mobility comments: Good log roll technique, increased cues and assist in order to come up to sitting with min A secondary to increased pain with mobility    Transfers Overall transfer level: Needs assistance Equipment used: Rolling walker (2 wheels) Transfers: Sit to/from Stand Sit to Stand: Min assist           General transfer comment: MIN A to stand from EOB with RW, VC for proper hand placement and adequate anterior weight shift to help with balance. Reports some increased pain with task, but pain resolves mildly once standing up with good posture    Ambulation/Gait Ambulation/Gait assistance: Contact guard assist Gait Distance (Feet): 150 Feet Assistive device: Rolling walker (2 wheels) Gait Pattern/deviations: Narrow base of support Gait velocity: decreased     General Gait Details: slow cadence, good balance noted, slight forward flexed posture, no reported increased pain with task  Stairs            Wheelchair Mobility     Tilt Bed    Modified Rankin (Stroke Patients Only)       Balance Overall balance assessment: Needs assistance Sitting-balance support: Bilateral upper extremity supported, Feet supported Sitting balance-Leahy Scale: Fair     Standing balance support: Bilateral upper extremity supported, During functional activity, Reliant on assistive device for balance Standing balance-Leahy Scale: Poor Standing balance comment: reliant on RW in standing  Pertinent Vitals/Pain Pain Assessment Pain Assessment: Faces Faces Pain Scale: Hurts little more Pain Location: abdomen Pain Descriptors / Indicators: Discomfort, Grimacing, Guarding Pain Intervention(s): Limited activity within patient's  tolerance, Monitored during session, Premedicated before session, Repositioned    Home Living Family/patient expects to be discharged to:: Private residence Living Arrangements: Alone Available Help at Discharge: Available PRN/intermittently;Family Type of Home: Apartment Home Access: Level entry       Home Layout: One level Home Equipment: Cane - single Librarian, Academic (2 wheels);Grab bars - tub/shower;Shower seat;Hand held shower head      Prior Function Prior Level of Function : Independent/Modified Independent;Driving;History of Falls (last six months)             Mobility Comments: has been falling for the last two the three months, reports having 3 falls in the last 2 months, drives short distances due to visual deficits in L eye, uses Gisele when needed to go to the doctor, reports having some memory difficulties, reports being forgetful from time to time ADLs Comments: has been sponge bathing more recently     Extremity/Trunk Assessment   Upper Extremity Assessment Upper Extremity Assessment: Defer to OT evaluation    Lower Extremity Assessment Lower Extremity Assessment: Generalized weakness    Cervical / Trunk Assessment Cervical / Trunk Assessment: Kyphotic (minimally)  Communication   Communication Communication: No apparent difficulties    Cognition Arousal: Alert Behavior During Therapy: WFL for tasks assessed/performed                             Following commands: Intact       Cueing Cueing Techniques: Verbal cues     General Comments General comments (skin integrity, edema, etc.): grimacing noted with mobility, VC for awareness to JP drain when moving in bed    Exercises     Assessment/Plan    PT Assessment Patient needs continued PT services  PT Problem List Decreased strength;Decreased range of motion;Decreased activity tolerance;Decreased balance;Decreased mobility;Pain       PT Treatment Interventions Gait  training;Functional mobility training;Therapeutic activities;Therapeutic exercise;Patient/family education;Balance training    PT Goals (Current goals can be found in the Care Plan section)  Acute Rehab PT Goals Patient Stated Goal: get stronger and go home to my dog PT Goal Formulation: With patient Time For Goal Achievement: 08/27/24 Potential to Achieve Goals: Good    Frequency Min 2X/week     Co-evaluation               AM-PAC PT 6 Clicks Mobility  Outcome Measure Help needed turning from your back to your side while in a flat bed without using bedrails?: A Little Help needed moving from lying on your back to sitting on the side of a flat bed without using bedrails?: A Little Help needed moving to and from a bed to a chair (including a wheelchair)?: A Little Help needed standing up from a chair using your arms (e.g., wheelchair or bedside chair)?: A Little Help needed to walk in hospital room?: A Little Help needed climbing 3-5 steps with a railing? : A Lot 6 Click Score: 17    End of Session Equipment Utilized During Treatment: Gait belt Activity Tolerance: Patient tolerated treatment well;Patient limited by pain Patient left: in bed;with call bell/phone within reach Nurse Communication: Mobility status PT Visit Diagnosis: History of falling (Z91.81);Pain;Muscle weakness (generalized) (M62.81) Pain - Right/Left:  (abdomen)    Time: 8674-8644 PT Time Calculation (  min) (ACUTE ONLY): 30 min   Charges:   PT Evaluation $PT Eval Low Complexity: 1 Low PT Treatments $Gait Training: 8-22 mins           Isaiah DEL. Daaiel Starlin, PT, DPT   [employer] 08/13/2024, 2:04 PM

## 2024-08-13 NOTE — Progress Notes (Addendum)
 6 Days Post-Op Procedures (LRB): DIAGNOSTIC LAPAROSCOPY (N/A) LAPAROTOMY FOR RIGHT SALPINGO-OOPHORECTOMY (Right) RECTOSIGMOID RESECTION WITH LOW ANASTAMOSIS SMALL BOWEL RESECTION WITH ANASTAMOSIS VULVAR BIOPSIES  Subjective: Patient reports improvement in abdominal soreness. She continues to pass flatus. No nausea or emesis reported. Having loose bowel movements. Denies chest pain, dyspnea. Had light vaginal bleeding on her peri pad, denies heavier bleeding. Having pain that starts in rectum and radiates to pelvis. States this was somewhat present preop. Tolerating diet. Voiding without difficulty. Up with assist. Granddaughter at the bedside. Inquiring about home health at discharged.    Objective: Vital signs in last 24 hours: Temp:  [97.5 F (36.4 C)-98.6 F (37 C)] 97.7 F (36.5 C) (01/19 0455) Pulse Rate:  [58-65] 58 (01/19 0455) Resp:  [14-18] 18 (01/19 0455) BP: (135-157)/(56-73) 143/58 (01/19 0455) SpO2:  [98 %-100 %] 100 % (01/19 0455) Last BM Date : 08/07/24  Intake/Output from previous day: 01/18 0701 - 01/19 0700 In: 600 [P.O.:600] Out: 515 [Urine:400; Drains:115]  Physical Examination: General: alert, cooperative, and no distress Resp: clear to auscultation bilaterally Cardio: regular rate and rhythm, S1, S2 normal, no murmur, click, rub or gallop GI: incision: abdominal incision covered with a dry dressing, jp drain with serosanguinous drainage and abdomen soft, active bowel sounds, non-distended Extremities: extremities normal, atraumatic, no cyanosis or edema SCDs currently off  Labs: WBC/Hgb/Hct/Plts:  5.9/8.0/24.5/224 (01/19 0423) BUN/Cr/glu/ALT/AST/amyl/lip:  26/0.93/--/--/--/--/-- (01/19 0423)  Assessment: 79 y.o. s/p Procedures: DIAGNOSTIC LAPAROSCOPY LAPAROTOMY FOR RIGHT SALPINGO-OOPHORECTOMY RECTOSIGMOID RESECTION WITH LOW ANASTAMOSIS SMALL BOWEL RESECTION WITH ANASTAMOSIS VULVAR BIOPSIES: stable Pain:  Pain is well-controlled on PRN  medications.  Heme: Hgb 8.0 and Hct 24.5 this am s/p transfusion of 1 unit PRBCs on 08/09/2024. Surgical EBL 500 cc. Overall stable. Drain with serosanguinous drainage.  ID: WBC 5.9. Given decadron  and intra-op antibiotics. Afebrile. Continue to monitor.  CV: Hypertension improving. Will continue to monitor, meds ordered. HR stable.  GI:  Tolerating po: yes. +bowel function. Antiemetics ordered if needed. S/P RECTOSIGMOID RESECTION WITH LOW ANASTOMOSIS, SMALL BOWEL RESECTION WITH ANASTOMOSIS.   GU: Voiding. Creatinine 0.93. Adequate urine reported overnight.  FEN: No critical values on am labs.   Prophylaxis: Lovenox  and SCDs ordered.  Plan: PT to evaluate patient for recommendations at discharge  Await more formed bowel movement Continue monitoring vaginal bleeding, I&O Continue with current plan of care   LOS: 6 days    [name] 08/13/2024, 8:29 AM

## 2024-08-13 NOTE — TOC Initial Note (Addendum)
 Transition of Care [facility]) - Initial/Assessment Note    Patient Details  Name: [name] MRN: [id number] Date of Birth: Jan 28, 1946  Transition of Care [facility]) CM/SW Contact:    NORMAN ASPEN, LCSW Phone Number: 08/13/2024, 1:09 PM  Clinical Narrative:                  ADDENDUM: Have secured a SNF bed with [facility].  MD aware and notes pt may be medically ready in 1-2 days.  Will start insurance authorization.  Pt and grandtr aware and very pleased.  Met with pt and grandtr today to review therapy recommendation for short term SNF/ rehab.  Both are agreeable with this plan since pt is home alone.  Grandtr very supportive, however, does not live locally.  Reviewed preferred facilities and will begin SNF bed search and insurance auth (one facility confirmed.)  Expected Discharge Plan: Skilled Nursing Facility     Patient Goals and CMS Choice Patient states their goals for this hospitalization and ongoing recovery are:: return home following SNF rehab          Expected Discharge Plan and Services In-house Referral: Clinical Social Work   Post Acute Care Choice: Skilled Nursing Facility Living arrangements for the past 2 months: Apartment                 DME Arranged: N/A DME Agency: NA                  Prior Living Arrangements/Services Living arrangements for the past 2 months: Apartment Lives with:: Self Patient language and need for interpreter reviewed:: Yes Do you feel safe going back to the place where you live?: Yes      Need for Family Participation in Patient Care: Yes (Comment) Care giver support system in place?: Yes (comment)   Criminal Activity/Legal Involvement Pertinent to Current Situation/Hospitalization: No - Comment as needed  Activities of Daily Living   ADL Screening (condition at time of admission) Independently performs ADLs?: Yes (appropriate for developmental age) Is the patient deaf or have difficulty hearing?: No Does the patient  have difficulty seeing, even when wearing glasses/contacts?: No Does the patient have difficulty concentrating, remembering, or making decisions?: Yes  Permission Sought/Granted Permission sought to share information with : Family Supports Permission granted to share information with : Yes, Verbal Permission Granted  Share Information with NAME: merita Wallace Molt @ [phone]           Emotional Assessment Appearance:: Appears stated age Attitude/Demeanor/Rapport: Gracious Affect (typically observed): Accepting Orientation: : Oriented to Self, Oriented to Place, Oriented to  Time Alcohol / Substance Use: Not Applicable Psych Involvement: No (comment)  Admission diagnosis:  Pelvic mass [R19.00] Pelvic mass in female [R19.00] Patient Active Problem List   Diagnosis Date Noted   Pelvic mass 08/07/2024   Pelvic mass in female 08/07/2024   Vulvar lesion 08/07/2024   S/p reverse total shoulder arthroplasty 09/18/2015   Abnormal nuclear cardiac imaging test    Diabetes mellitus without complication (HCC) 04/22/2015   Depression 04/22/2015   Chest pain 04/21/2015   Chronic diastolic CHF (congestive heart failure) (HCC) 04/21/2015   CKD (chronic kidney disease), stage III (HCC)    Pain in the chest    Essential hypertension, benign 12/11/2014   Spinal stenosis of lumbar region 03/20/2013   Acute on chronic kidney failure 03/20/2013   (HFpEF) heart failure with preserved ejection fraction (HCC) 03/20/2013   Gout 03/20/2013   Anemia of chronic disease 03/20/2013  Trochanteric bursitis 03/19/2013   Lumbar spondylosis with myelopathy 02/27/2013   Giant cell arteritis (HCC) 12/23/2011   Foot pain, right    CAD (coronary artery disease)    GERD (gastroesophageal reflux disease)    Sleep apnea    PAD (peripheral artery disease)    Ejection fraction    Mitral regurgitation    FLUID OVERLOAD 06/04/2009   HYPOKALEMIA 06/04/2009   SHORTNESS OF BREATH 05/07/2009   DM  05/06/2009   HLD (hyperlipidemia) 05/06/2009   Overweight 05/06/2009   Essential hypertension 05/06/2009   PCP:  Delores Rojelio Caldron, NP Pharmacy:   [id number] [place], [place] - [address] AT [facility] & [address] [place] [address] Phone: [phone] Fax: [phone]     Social Drivers of Health (SDOH) Social History: SDOH Screenings   Food Insecurity: No Food Insecurity (08/08/2024)  Housing: Low Risk (08/08/2024)  Transportation Needs: No Transportation Needs (08/08/2024)  Utilities: Not At Risk (08/08/2024)  Depression (PHQ2-9): Low Risk (07/02/2024)  Social Connections: Moderately Isolated (08/08/2024)  Tobacco Use: Medium Risk (08/07/2024)   SDOH Interventions:     Readmission Risk Interventions    08/09/2024    1:19 PM  Readmission Risk Prevention Plan  Transportation Screening Complete  PCP or Specialist Appt within 5-7 Days Complete  Home Care Screening Complete  Medication Review (RN CM) Complete

## 2024-08-14 LAB — CBC
HCT: 24.3 % — ABNORMAL LOW (ref 36.0–46.0)
Hemoglobin: 7.8 g/dL — ABNORMAL LOW (ref 12.0–15.0)
MCH: 29.2 pg (ref 26.0–34.0)
MCHC: 32.1 g/dL (ref 30.0–36.0)
MCV: 91 fL (ref 80.0–100.0)
Platelets: 257 K/uL (ref 150–400)
RBC: 2.67 MIL/uL — ABNORMAL LOW (ref 3.87–5.11)
RDW: 13.2 % (ref 11.5–15.5)
WBC: 6.2 K/uL (ref 4.0–10.5)
nRBC: 0 % (ref 0.0–0.2)

## 2024-08-14 LAB — BASIC METABOLIC PANEL WITH GFR
Anion gap: 6 (ref 5–15)
BUN: 21 mg/dL (ref 8–23)
CO2: 28 mmol/L (ref 22–32)
Calcium: 8.9 mg/dL (ref 8.9–10.3)
Chloride: 105 mmol/L (ref 98–111)
Creatinine, Ser: 0.89 mg/dL (ref 0.44–1.00)
GFR, Estimated: >60 mL/min
Glucose, Bld: 82 mg/dL (ref 70–99)
Potassium: 4.2 mmol/L (ref 3.5–5.1)
Sodium: 138 mmol/L (ref 135–145)

## 2024-08-14 LAB — SURGICAL PATHOLOGY

## 2024-08-14 NOTE — Progress Notes (Signed)
 Pt has c/o pain in his stomach, clinical research associate assessed his stomach he say it is tender when touch pain meds was given see Mar will continue to monitor for safety

## 2024-08-14 NOTE — Plan of Care (Signed)
   Problem: Education: Goal: Knowledge of General Education information will improve Description Including pain rating scale, medication(s)/side effects and non-pharmacologic comfort measures Outcome: Progressing

## 2024-08-14 NOTE — Progress Notes (Signed)
 Mobility Specialist - Progress Note:   08/14/24 1038  Mobility  Activity Ambulated with assistance  Level of Assistance Minimal assist, patient does 75% or more  Assistive Device Front wheel walker  Distance Ambulated (ft) 25 ft  Activity Response Tolerated fair  Mobility Referral Yes  Mobility visit 1 Mobility  Mobility Specialist Start Time (ACUTE ONLY) 0910  Mobility Specialist Stop Time (ACUTE ONLY) 0925  Mobility Specialist Time Calculation (min) (ACUTE ONLY) 15 min   Pt was received in bed and agreed to mobility. Pt stated lightheadedness at the edge of bed, subsided after a few minutes. Min A sit to stand. Limited ambulation due to abdominal pain. Returned to recliner with all needs met. Call bell in reach.  [employer] - Mobility Specialist - Acute Rehabilitation Can be reached via [employer]

## 2024-08-14 NOTE — Progress Notes (Signed)
 7 Days Post-Op Procedures (LRB): DIAGNOSTIC LAPAROSCOPY (N/A) LAPAROTOMY FOR RIGHT SALPINGO-OOPHORECTOMY (Right) RECTOSIGMOID RESECTION WITH LOW ANASTAMOSIS SMALL BOWEL RESECTION WITH ANASTAMOSIS VULVAR BIOPSIES  Subjective: Patient reports having mid to lower abdominal pain during the night and this am. The prn pain medication helped some with this. Continues to tolerate her diet. No nausea or emesis reported. Ambulating with assist. Voiding. Had some bright red blood in the toilet this am. Feels it was not a large amount. Passing flatus and have a small, more solid bowel movement.   Objective: Vital signs in last 24 hours: Temp:  [97.9 F (36.6 C)-99.4 F (37.4 C)] 97.9 F (36.6 C) (01/20 0523) Pulse Rate:  [56-62] 56 (01/20 0523) Resp:  [16] 16 (01/20 0523) BP: (131-163)/(58-65) 131/58 (01/20 0523) SpO2:  [99 %] 99 % (01/20 0523) Last BM Date : 08/13/24  Intake/Output from previous day: 01/19 0701 - 01/20 0700 In: 1120 [P.O.:1120] Out: 575 [Urine:550; Drains:25]  Physical Examination: General: alert, cooperative, and no distress Resp: clear to auscultation bilaterally Cardio: regular rate and rhythm, S1, S2 normal, no murmur, click, rub or gallop GI: incision: abdominal midline incision intact with dermabond present with no drainage, jp drain with serous drainage and abdomen soft, hypoactive bowel sounds, non-distended Extremities: extremities normal, atraumatic, no cyanosis or edema SCDs currently off  Labs: WBC/Hgb/Hct/Plts:  6.2/7.8/24.3/257 (01/20 9561) BUN/Cr/glu/ALT/AST/amyl/lip:  21/0.89/--/--/--/--/-- (01/20 9561)  Assessment: 79 y.o. s/p Procedures: DIAGNOSTIC LAPAROSCOPY LAPAROTOMY FOR RIGHT SALPINGO-OOPHORECTOMY RECTOSIGMOID RESECTION WITH LOW ANASTAMOSIS SMALL BOWEL RESECTION WITH ANASTAMOSIS VULVAR BIOPSIES: stable Pain:  Pain is well-controlled on PRN medications.  Heme: Hgb 7.8 and Hct 24.3 this am s/p transfusion of 1 unit PRBCs on 08/09/2024. Surgical  EBL 500 cc. Overall stable. Drain with serous drainage.  ID: WBC 6.2. Given decadron  and intra-op antibiotics. Afebrile. Continue to monitor.  CV: Hypertension improving. Will continue to monitor, meds ordered. HR stable.  GI:  Tolerating po: yes. +bowel function. Antiemetics ordered if needed. S/P RECTOSIGMOID RESECTION WITH LOW ANASTOMOSIS, SMALL BOWEL RESECTION WITH ANASTOMOSIS.   GU: Voiding. Creatinine 0.89. Adequate urine reported overnight.  FEN: No critical values on am labs.   Prophylaxis: Lovenox  and SCDs ordered.  Plan: SW team working on SNF placement when ready Await more formed bowel movement Monitor abdominal pain Continue monitoring vaginal bleeding, I&O Continue with current plan of care   LOS: 7 days    Eleanor JONETTA Epps 08/14/2024, 8:14 AM

## 2024-08-14 NOTE — Plan of Care (Signed)
" °  Problem: Education: Goal: Knowledge of General Education information will improve Description: Including pain rating scale, medication(s)/side effects and non-pharmacologic comfort measures Outcome: Progressing   Problem: Health Behavior/Discharge Planning: Goal: Ability to manage health-related needs will improve Outcome: Progressing   Problem: Clinical Measurements: Goal: Ability to maintain clinical measurements within normal limits will improve Outcome: Progressing Goal: Will remain free from infection Outcome: Progressing Goal: Diagnostic test results will improve Outcome: Progressing Goal: Respiratory complications will improve Outcome: Progressing Goal: Cardiovascular complication will be avoided Outcome: Progressing   Problem: Activity: Goal: Risk for activity intolerance will decrease Outcome: Progressing   Problem: Nutrition: Goal: Adequate nutrition will be maintained Outcome: Progressing   Problem: Coping: Goal: Level of anxiety will decrease Outcome: Progressing   Problem: Elimination: Goal: Will not experience complications related to bowel motility Outcome: Progressing Goal: Will not experience complications related to urinary retention Outcome: Progressing   Problem: Pain Managment: Goal: General experience of comfort will improve and/or be controlled Outcome: Progressing   Problem: Safety: Goal: Ability to remain free from injury will improve Outcome: Progressing   Problem: Skin Integrity: Goal: Risk for impaired skin integrity will decrease Outcome: Progressing   Problem: Education: Goal: Knowledge of the prescribed therapeutic regimen will improve Outcome: Progressing   Problem: Bowel/Gastric: Goal: Gastrointestinal status for postoperative course will improve Outcome: Progressing   Problem: Cardiac: Goal: Ability to maintain an adequate cardiac output Outcome: Progressing Goal: Will show no evidence of cardiac arrhythmias Outcome:  Progressing   Problem: Nutritional: Goal: Will attain and maintain optimal nutritional status Outcome: Progressing   Problem: Neurological: Goal: Will regain or maintain usual level of consciousness Outcome: Progressing   Problem: Clinical Measurements: Goal: Ability to maintain clinical measurements within normal limits Outcome: Progressing Goal: Postoperative complications will be avoided or minimized Outcome: Progressing   Problem: Respiratory: Goal: Will regain and/or maintain adequate ventilation Outcome: Progressing Goal: Respiratory status will improve Outcome: Progressing   Problem: Skin Integrity: Goal: Demonstrates signs of wound healing without infection Outcome: Progressing   Problem: Urinary Elimination: Goal: Will remain free from infection Outcome: Progressing Goal: Ability to achieve and maintain adequate urine output Outcome: Progressing   Problem: Education: Goal: Knowledge of the prescribed therapeutic regimen will improve Outcome: Progressing Goal: Understanding of sexual limitations or changes related to disease process or condition will improve Outcome: Progressing Goal: Individualized Educational Video(s) Outcome: Progressing   Problem: Self-Concept: Goal: Communication of feelings regarding changes in body function or appearance will improve Outcome: Progressing   Problem: Skin Integrity: Goal: Demonstration of wound healing without infection will improve Outcome: Progressing   "

## 2024-08-15 NOTE — Progress Notes (Signed)
 8 Days Post-Op Procedures (LRB): DIAGNOSTIC LAPAROSCOPY (N/A) LAPAROTOMY FOR RIGHT SALPINGO-OOPHORECTOMY (Right) RECTOSIGMOID RESECTION WITH LOW ANASTAMOSIS SMALL BOWEL RESECTION WITH ANASTAMOSIS VULVAR BIOPSIES  Subjective: Patient reports doing well this am. Abdominal pain is minimal. No nausea or emesis. Tolerating diet. Voiding without difficulty. Not passing a large amount of flatus. Had a small bowel movement that was just a smear. Denies chest pain, dyspnea. No significant vaginal bleeding. No concerns voiced.  Objective: Vital signs in last 24 hours: Temp:  [98.4 F (36.9 C)-99.7 F (37.6 C)] 99.2 F (37.3 C) (01/21 0515) Pulse Rate:  [57-64] 64 (01/21 0515) Resp:  [16] 16 (01/21 0515) BP: (112-162)/(57-78) 162/78 (01/21 0515) SpO2:  [97 %-100 %] 98 % (01/21 0515) Last BM Date : 08/13/24  Intake/Output from previous day: 01/20 0701 - 01/21 0700 In: 1000 [P.O.:1000] Out: 517.5 [Urine:500; Drains:17.5]  Physical Examination: General: alert, cooperative, and no distress Resp: clear to auscultation bilaterally Cardio: regular rate and rhythm, S1, S2 normal, no murmur, click, rub or gallop GI: incision: abdominal incision midline with dermabond intact with no drainage, jp drain with serous drainage and abdomen soft, hypoactive bowel sounds, non-distended Extremities: extremities normal, atraumatic, no cyanosis or edema SCDs currently off  Labs: From 08/14/2024, overall stable  Assessment: 79 y.o. s/p Procedures: DIAGNOSTIC LAPAROSCOPY LAPAROTOMY FOR RIGHT SALPINGO-OOPHORECTOMY RECTOSIGMOID RESECTION WITH LOW ANASTAMOSIS SMALL BOWEL RESECTION WITH ANASTAMOSIS VULVAR BIOPSIES: stable Pain:  Pain is well-controlled on PRN medications.  Heme: Hgb 8.0 and Hct 24.5 from 08/14/24 am s/p transfusion of 1 unit PRBCs on 08/09/2024. Surgical EBL 500 cc. Overall stable. Drain with serosanguinous drainage.  ID: WBC 5.9 on 08/14/2024. Given decadron  and intra-op antibiotics.  Afebrile. Continue to monitor.  CV: Hypertension improving. Will continue to monitor, meds ordered. HR stable.  GI:  Tolerating po: yes. +bowel function. Antiemetics ordered if needed. S/P RECTOSIGMOID RESECTION WITH LOW ANASTOMOSIS, SMALL BOWEL RESECTION WITH ANASTOMOSIS.   GU: Voiding. Creatinine 0.93 on 08/14/2024. Adequate urine reported overnight.  FEN: No critical values on am labs.   Prophylaxis: Lovenox  and SCDs ordered.  Plan: Awaiting more formed bowel movement per Dr. Eldonna given rectosig resection and anastomosis. Continue monitoring vaginal bleeding, I&O Continue with current plan of care Plan for discharge to SNF when ready   LOS: 8 days    [name] 08/15/2024, 8:14 AM

## 2024-08-15 NOTE — Progress Notes (Signed)
 Occupational Therapy Treatment Patient Details Name: [name] MRN: [id number] DOB: May 31, 1946 Today's Date: 08/15/2024   History of present illness Patient is an 79 yo female presenting to the ED on 08/07/24 with a pelvic mass and with laparotomy, small bowel  and rectosigmoid resection, and vulvar biopsies. PMH includes: HTN, CAD, GERD, DM2, depression   OT comments  Pt in bed upon therapy arrival and agreeable to participate in OT treatment session focusing on self care during functional mobility and toileting. Pt demonstrated progress with therapy goals. Education provided on use of log roll technique during bed mobility to help with pain management. Pt able to retrieve necessary items for toileting while OT set up bathroom for safety and easy access. Pt navigated around room and bathroom with RW demonstrating good safety awareness and judgement.       If plan is discharge home, recommend the following:  A little help with walking and/or transfers;A lot of help with bathing/dressing/bathroom;Assistance with cooking/housework;Assist for transportation   Equipment Recommendations  Tub/shower bench;Other (comment) (Rolator)       Precautions / Restrictions Precautions Precautions: Fall;Other (comment) Recall of Precautions/Restrictions: Intact Precaution/Restrictions Comments: JP drain Restrictions Weight Bearing Restrictions Per Provider Order: No       Mobility Bed Mobility Overal bed mobility: Needs Assistance Bed Mobility: Rolling, Sidelying to Sit Rolling: Supervision, Used rails Sidelying to sit: Supervision, HOB elevated, Used rails       General bed mobility comments: Pt educated on log roll technique d/t pain at JP drain site when attempting to transition from supine to sit.    Transfers Overall transfer level: Needs assistance Equipment used: Rolling walker (2 wheels) Transfers: Sit to/from Stand, Bed to chair/wheelchair/BSC Sit to Stand: Supervision Stand  pivot transfers: Supervision         General transfer comment: Pt able to demonstrate proper hand placement during RW management. No LOB.     Balance Overall balance assessment: Needs assistance Sitting-balance support: Bilateral upper extremity supported, Feet supported Sitting balance-Leahy Scale: Good Sitting balance - Comments: seated on toilet completing hygiene   Standing balance support: Bilateral upper extremity supported, Single extremity supported, No upper extremity supported, During functional activity Standing balance-Leahy Scale: Good Standing balance comment: able to let go of RW while standing at sink to wash hands      ADL either performed or assessed with clinical judgement   ADL Overall ADL's : Needs assistance/impaired     Grooming: Wash/dry hands;Standing;Modified independent      Lower Body Dressing: Modified independent;Sitting/lateral leans;Sit to/from stand   Toilet Transfer: Modified Independent;Ambulation;Regular Toilet;Rolling walker (2 wheels)   Toileting- Clothing Manipulation and Hygiene: Set up;Sitting/lateral lean;Sit to/from stand       Functional mobility during ADLs: Supervision/safety;Rolling walker (2 wheels) General ADL Comments: Completed functional mobility within room utilizing RW to walk from bed to toilet to recliner with SBA.              Communication Communication Communication: No apparent difficulties   Cognition Arousal: Alert Behavior During Therapy: WFL for tasks assessed/performed Cognition: No apparent impairments  Following commands: Intact        Cueing   Cueing Techniques: Verbal cues        General Comments VSS on RA    Pertinent Vitals/ Pain       Pain Assessment Pain Assessment: 0-10 Pain Score: 5  Pain Location: abdomen/JP drain site during bed mobility Pain Descriptors / Indicators: Discomfort, Grimacing, Guarding Pain Intervention(s): Limited activity within patient's tolerance,  Monitored  during session, Other (comment), Patient requesting pain meds-RN notified (pt education on bed mobility modifications to decrease pain)         Frequency  Min 2X/week        Progress Toward Goals  OT Goals(current goals can now be found in the care plan section)  Progress towards OT goals: Progressing toward goals  ADL Goals Pt Will Transfer to Toilet:  (goal met 1/21) Additional ADL Goal #1:  (goal met 1/21)  Plan         AM-PAC OT 6 Clicks Daily Activity     Outcome Measure   Help from another person eating meals?: None Help from another person taking care of personal grooming?: None Help from another person toileting, which includes using toliet, bedpan, or urinal?: A Little Help from another person bathing (including washing, rinsing, drying)?: A Little Help from another person to put on and taking off regular upper body clothing?: None Help from another person to put on and taking off regular lower body clothing?: None 6 Click Score: 22    End of Session Equipment Utilized During Treatment: Rolling walker (2 wheels)  OT Visit Diagnosis: Unsteadiness on feet (R26.81);Other abnormalities of gait and mobility (R26.89);Repeated falls (R29.6);Muscle weakness (generalized) (M62.81);History of falling (Z91.81);Pain Pain - part of body:  (abdomen)   Activity Tolerance Patient tolerated treatment well   Patient Left in chair;with call bell/phone within reach;with family/visitor present   Nurse Communication Mobility status;Patient requests pain meds        Time: 8556-8472 OT Time Calculation (min): 44 min  Charges: OT General Charges $OT Visit: 1 Visit OT Treatments $Self Care/Home Management : 38-52 mins  Leita Howell, OTR/L,CBIS  Supplemental OT - [facility] and [facility] Secure Chat Preferred    Lillianna Sabel, Leita BIRCH 08/15/2024, 3:34 PM

## 2024-08-15 NOTE — TOC Progression Note (Signed)
 Transition of Care [facility]) - Progression Note    Patient Details  Name: [name] MRN: [id number] Date of Birth: [birth year]  [facility]) CM/SW Contact  Heather DELENA Saltness, LCSW Phone Number: 08/15/2024, 12:08 PM  Clinical Narrative:    Pt's insurance authorization for [facility] has been approved. Auth ID: [id number]. Pt to discharge to [facility], pending medical readiness. Brittany at [facility] notified.   Expected Discharge Plan: Skilled Nursing Facility    Expected Discharge Plan and Services In-house Referral: Clinical Social Work   Post Acute Care Choice: Skilled Nursing Facility Living arrangements for the past 2 months: Apartment                 DME Arranged: N/A DME Agency: NA                 Social Drivers of Health (SDOH) Interventions SDOH Screenings   Food Insecurity: No Food Insecurity (08/08/2024)  Housing: Low Risk (08/08/2024)  Transportation Needs: No Transportation Needs (08/08/2024)  Utilities: Not At Risk (08/08/2024)  Depression (PHQ2-9): Low Risk (07/02/2024)  Social Connections: Moderately Isolated (08/08/2024)  Tobacco Use: Medium Risk (08/07/2024)    Readmission Risk Interventions    08/09/2024    1:19 PM  Readmission Risk Prevention Plan  Transportation Screening Complete  PCP or Specialist Appt within 5-7 Days Complete  Home Care Screening Complete  Medication Review (RN CM) Complete   Signed: Heather Saltness, MSW, LCSW Clinical Social Worker Inpatient Care Management 08/15/2024 12:11 PM

## 2024-08-16 MED ORDER — GABAPENTIN 100 MG PO CAPS: 100.0000 mg | ORAL_CAPSULE | Freq: Three times a day (TID) | ORAL | 0 refills | Status: AC | PRN

## 2024-08-16 MED ORDER — HYDROCODONE-ACETAMINOPHEN 10-325 MG PO TABS: 1.0000 | ORAL_TABLET | Freq: Four times a day (QID) | ORAL | 0 refills | Status: AC | PRN

## 2024-08-16 MED ORDER — APIXABAN 2.5 MG PO TABS: 2.5000 mg | ORAL_TABLET | Freq: Two times a day (BID) | ORAL | 0 refills | Status: AC

## 2024-08-16 NOTE — TOC Transition Note (Signed)
 Transition of Care [facility]) - Discharge Note   Patient Details  Name: [name] MRN: [id number] Date of Birth: [birth year]  Transition of Care [facility]) CM/SW Contact:  Heather DELENA Saltness, LCSW Phone Number: 08/16/2024, 12:21 PM   Clinical Narrative:    Pt discharging [facility] today for SNF rehab. Pt admitting to room 604B. D/C packet placed in pt's chart at RN station. RN to call report to [phone]. PTAR called at 1211. Pt and granddaughter made aware and in agreement with discharge plan. No further TOC needs at this time.   Final next level of care: Skilled Nursing Facility Barriers to Discharge: Barriers Resolved   Patient Goals and CMS Choice Patient states their goals for this hospitalization and ongoing recovery are:: To go to [facility] for SNF CMS Medicare.gov Compare Post Acute Care list provided to:: Patient Choice offered to / list presented to : Patient      Discharge Placement  Patient chooses bed at: [facility] Patient to be transferred to facility by: PTAR Name of family member notified: Wallace Molt Patient and family notified of of transfer: 08/16/24  Discharge Plan and Services Additional resources added to the After Visit Summary for  N/A In-house Referral: Clinical Social Work   Post Acute Care Choice: Skilled Nursing Facility          DME Arranged: N/A DME Agency: NA       HH Arranged: NA HH Agency: NA        Social Drivers of Health (SDOH) Interventions SDOH Screenings   Food Insecurity: No Food Insecurity (08/08/2024)  Housing: Low Risk (08/08/2024)  Transportation Needs: No Transportation Needs (08/08/2024)  Utilities: Not At Risk (08/08/2024)  Depression (PHQ2-9): Low Risk (07/02/2024)  Social Connections: Moderately Isolated (08/08/2024)  Tobacco Use: Medium Risk (08/07/2024)     Readmission Risk Interventions    08/09/2024    1:19 PM  Readmission Risk Prevention Plan  Transportation Screening Complete  PCP or Specialist  Appt within 5-7 Days Complete  Home Care Screening Complete  Medication Review (RN CM) Complete     Signed: Heather Saltness, MSW, LCSW Clinical Social Worker Inpatient Care Management 08/16/2024 12:23 PM

## 2024-08-16 NOTE — Discharge Summary (Addendum)
 " Physician Discharge Summary  Patient ID: [name] MRN: [id number] DOB/AGE: 81-Aug-1947 81 y.o.  Admit date: 08/07/2024 Discharge date: 08/16/2024  Admission Diagnoses: Pelvic mass  Discharge Diagnoses:  Principal Problem:   Pelvic mass Active Problems:   Pelvic mass in female   Vulvar lesion   Decreased appetite   Discharged Condition:  The patient is in good condition and stable for discharge.    Hospital Course: On 08/07/2024, the patient underwent the following: Procedures: DIAGNOSTIC LAPAROSCOPY LAPAROTOMY FOR RIGHT SALPINGO-OOPHORECTOMY RECTOSIGMOID RESECTION WITH LOW ANASTAMOSIS SMALL BOWEL RESECTION WITH ANASTAMOSIS VULVAR BIOPSIES. The postoperative course was overall uneventful. She received a blood transfusion of 1 unit of PRBCs post-op on 08/09/2024. By POD 9, she had started having more formed bowel movements. She was discharged to home on postoperative day 9 tolerating a regular diet, ambulating, voiding, pain controlled with oral medications, having formed bowel movements, passing flatus.   Consults: PT, OT, Social Work  Significant Diagnostic Studies: Labs  Treatments: surgery: see above, blood transfusion of 1 unit PRBCs.  Discharge Exam: Blood pressure 123/75, pulse (!) 58, temperature 97.6 F (36.4 C), temperature source Oral, resp. rate 18, height 5' 2 (1.575 m), weight 126 lb 9.6 oz (57.4 kg), SpO2 100%. General: alert, cooperative, and no distress Resp: clear to auscultation bilaterally Cardio: regular rate and rhythm, S1, S2 normal, no murmur, click, rub or gallop GI: incision: abdominal incision midline with dermabond intact with no drainage, jp drain with minimal serous drainage and abdomen soft, active bowel sounds, non-distended Extremities: extremities normal, atraumatic, no cyanosis or edema SCDs currently off  Disposition: Discharge disposition: 03-Skilled Nursing Facility       Discharge Instructions     Call MD for:  difficulty  breathing, headache or visual disturbances   Complete by: As directed    Call MD for:  extreme fatigue   Complete by: As directed    Call MD for:  hives   Complete by: As directed    Call MD for:  persistant dizziness or light-headedness   Complete by: As directed    Call MD for:  persistant nausea and vomiting   Complete by: As directed    Call MD for:  redness, tenderness, or signs of infection (pain, swelling, redness, odor or green/yellow discharge around incision site)   Complete by: As directed    Call MD for:  severe uncontrolled pain   Complete by: As directed    Call MD for:  temperature >100.4   Complete by: As directed    Diet - low sodium heart healthy   Complete by: As directed    Discharge wound care:   Complete by: As directed    Keep clean dry dressing over the previous drain site until this scabs over. Dressing should be changed daily and can be a bandaid if no drainage   Driving Restrictions   Complete by: As directed    Do not drive unless you were medically cleared to drive before surgery. Do not take narcotics and drive. You need to make sure your reaction time has returned.   Increase activity slowly   Complete by: As directed    Lifting restrictions   Complete by: As directed    No lifting greater than 10 lbs, pushing, pulling, straining for 6 weeks.   Sexual Activity Restrictions   Complete by: As directed    No sexual activity, nothing in the vagina, for 6 weeks.      Allergies as of 08/16/2024  Reactions   Latex Itching   un   Amoxicillin Other (See Comments)   Yeast infection        Medication List     PAUSE taking these medications    aspirin  EC 81 MG tablet Wait to take this until: September 05, 2024 Take 1 tablet (81 mg total) by mouth daily.       STOP taking these medications    bisacodyl  5 MG EC tablet Commonly known as: DULCOLAX   senna-docusate 8.6-50 MG tablet Commonly known as: Senokot-S       TAKE these  medications    apixaban  2.5 MG Tabs tablet Commonly known as: Eliquis  Take 1 tablet (2.5 mg total) by mouth 2 (two) times daily.   atorvastatin  40 MG tablet Commonly known as: LIPITOR Take 1 tablet (40 mg total) by mouth daily. *PATIENT NEEDS AN APPOINTMENT FOR FURTHER REFILLS TO BE GRANTED OR OBTAIN FROM PCP* What changed: when to take this   carvedilol  6.25 MG tablet Commonly known as: COREG  Take 1 tablet (6.25 mg total) by mouth 2 (two) times daily.   citalopram  20 MG tablet Commonly known as: CELEXA  Take 20 mg by mouth every evening.   gabapentin  100 MG capsule Commonly known as: NEURONTIN  Take 1-2 capsules (100-200 mg total) by mouth 3 (three) times daily as needed (pain.).   HYDROcodone -acetaminophen  10-325 MG tablet Commonly known as: NORCO Take 1 tablet by mouth 4 (four) times daily as needed (pain.).   isosorbide  mononitrate 30 MG 24 hr tablet Commonly known as: IMDUR  Take 1 tablet (30 mg total) by mouth daily.   LIDOPRO EX Apply 1 Application topically as needed (cramping).   losartan  50 MG tablet Commonly known as: COZAAR  Take 50 mg by mouth daily after supper.   Nitrostat  0.4 MG SL tablet Generic drug: nitroGLYCERIN  DISSOLVE ONE TABLET UNDER TONGUE AS NEEDED FOR CHEST PAIN EVERY 5 MINUTES   oxybutynin 15 MG 24 hr tablet Commonly known as: DITROPAN XL Take 15 mg by mouth at bedtime.   polyethylene glycol 17 g packet Commonly known as: MiraLax  Take 17 g by mouth daily.   spironolactone 25 MG tablet Commonly known as: ALDACTONE Take 25 mg by mouth in the morning.   traZODone  100 MG tablet Commonly known as: DESYREL  Take 200 mg by mouth at bedtime.   Vitamin D3 125 MCG (5000 UT) Tabs Take 5,000 Units by mouth in the morning.               Discharge Care Instructions  (From admission, onward)           Start     Ordered   08/16/24 0000  Discharge wound care:       Comments: Keep clean dry dressing over the previous drain site until  this scabs over. Dressing should be changed daily and can be a bandaid if no drainage   08/16/24 0800            Contact information for follow-up providers     Eldonna Mays, MD Follow up on 08/27/2024.   Specialty: Gynecologic Oncology Why: at 1:45 pm at the [facility] information: [address] [place] [address] [phone]              Contact information for after-discharge care     Destination     [facility] .   Service: Skilled Nursing Contact information: [address] [place]  [phone]  Greater than thirty minutes were spend for face to face discharge instructions and discharge orders/summary in EPIC.   Signed: Eleanor JONETTA Epps 08/16/2024, 11:31 AM     "

## 2024-08-16 NOTE — Progress Notes (Signed)
 Discharge instructions given to [facility] [phone] and all questions were answered. Patients granddaughter was called and agreed with plan of care.

## 2024-08-16 NOTE — Care Management Important Message (Signed)
 Important Message  Patient Details IM Letter given. Name: [name] MRN: [id number] Date of Birth: February 10, 1946   Important Message Given:  Yes - Medicare IM     Melba Ates 08/16/2024, 9:26 AM

## 2024-08-17 ENCOUNTER — Telehealth: Payer: Self-pay | Admitting: Surgery

## 2024-08-17 NOTE — Telephone Encounter (Signed)
 Spoke with [name]'s granddaughter, [name] this morning. She states her grandmother is eating, drinking and urinating well. She is having regular bowel movements. She denies fever or chills. Incisions are dry and intact. She is doing well settled into [facility] and feels secure there with 24hr care.   Instructed to call office with any fever, chills, purulent drainage, uncontrolled pain or any other questions or concerns. Patient verbalizes understanding.   Pt aware of post op appointments as well as the office number [phone] and after hours number [phone] to call if she has any questions or concerns

## 2024-08-20 ENCOUNTER — Telehealth: Payer: Self-pay | Admitting: Psychiatry

## 2024-08-20 ENCOUNTER — Telehealth: Payer: Self-pay | Admitting: Genetic Counselor

## 2024-08-20 ENCOUNTER — Other Ambulatory Visit: Payer: Self-pay | Admitting: Oncology

## 2024-08-20 NOTE — Telephone Encounter (Signed)
 Gyn Onc Tumor Board  Quaneshia Wareing was presented on the Gyn Onc tumor board on 08/20/24.   Lauraine Purpura meets National Comprehensive Cancer Network (NCCN) criteria for genetic testing based on her personal history of serous ovarian cancer.   Please discuss with patient and consider referral to genetics.   Burnard Ogren, MS, [facility] Licensed, [email or web address] phone: [phone]

## 2024-08-20 NOTE — Progress Notes (Signed)
 Gynecologic Oncology Multi-Disciplinary Disposition Conference Note  Date of the Conference: 08/20/2024  Patient Name: [name]  Referring Provider: Rojelio Jenkins Daring, NP Primary GYN Oncologist: Dr. 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 Disposition is to systemic chemotherapy. Also send for NGS and somatic testing.  This Multidisciplinary conference took place involving physicians from Gynecologic Oncology, Medical Oncology, Radiation Oncology, Pathology, Radiology along with the Gynecologic Oncology Nurse Practitioner and Gynecologic Oncology Nurse Navigator.  Comprehensive assessment of the patient's malignancy, staging, need for surgery, chemotherapy, radiation therapy, and need for further testing were reviewed. Supportive measures, both inpatient and following discharge were also discussed. The recommended plan of care is documented. Greater than 35 minutes were spent correlating and coordinating this patient's care.

## 2024-08-20 NOTE — Telephone Encounter (Signed)
 Attempted to call pt and pt's granddaughter regarding pathology results. No answer. Did not leave VM given no identification on VM. Will try again at another time.

## 2024-08-21 ENCOUNTER — Encounter: Payer: Self-pay | Admitting: Oncology

## 2024-08-21 ENCOUNTER — Telehealth: Payer: Self-pay | Admitting: Oncology

## 2024-08-21 ENCOUNTER — Telehealth: Payer: Self-pay | Admitting: Psychiatry

## 2024-08-21 DIAGNOSIS — C561 Malignant neoplasm of right ovary: Secondary | ICD-10-CM | POA: Insufficient documentation

## 2024-08-21 NOTE — Progress Notes (Signed)
 Sent order requisition for [facility] testing on accession [id number] per Dr. Eldonna.

## 2024-08-21 NOTE — Telephone Encounter (Signed)
 Called pt's granddaughter with pathology results and tumor board recommendations. Will send NGS. Referral to Dr. Lonn and genetics.

## 2024-08-21 NOTE — Telephone Encounter (Signed)
 Called Shereena (granddaughter) and reviewed the Gyn Oncology Cancer Conference recommendations for chemotherapy and referral for genetic counseling.    Advised her of appointment with Dr. Lonn on 09/06/24 at 2:00 with arrival at 1:30 to check in at the cancer center.  Also scheduled genetic counseling appointment for 10/01/24 at 1:00.

## 2024-08-23 ENCOUNTER — Telehealth: Payer: Self-pay | Admitting: Oncology

## 2024-08-23 NOTE — Telephone Encounter (Signed)
 Called Shereena and rescheduled Nataly's appointment with Dr. Lonn to 08/28/24 at 1:00 with arrival to the cancer center at 12:30 to check in.

## 2024-08-27 ENCOUNTER — Inpatient Hospital Stay: Admitting: Psychiatry

## 2024-08-27 ENCOUNTER — Encounter (HOSPITAL_COMMUNITY): Payer: Self-pay

## 2024-08-27 DIAGNOSIS — R1900 Intra-abdominal and pelvic swelling, mass and lump, unspecified site: Secondary | ICD-10-CM

## 2024-08-28 ENCOUNTER — Inpatient Hospital Stay: Admitting: Psychiatry

## 2024-08-28 ENCOUNTER — Inpatient Hospital Stay: Attending: Psychiatry | Admitting: Hematology and Oncology

## 2024-08-28 ENCOUNTER — Encounter: Payer: Self-pay | Admitting: Hematology and Oncology

## 2024-08-28 ENCOUNTER — Inpatient Hospital Stay

## 2024-08-28 ENCOUNTER — Encounter: Payer: Self-pay | Admitting: Psychiatry

## 2024-08-28 VITALS — BP 138/59 | HR 61 | Temp 98.7°F | Resp 19 | Wt 126.9 lb

## 2024-08-28 VITALS — BP 143/57 | HR 51 | Temp 97.7°F | Resp 18 | Wt 126.6 lb

## 2024-08-28 DIAGNOSIS — R87612 Low grade squamous intraepithelial lesion on cytologic smear of cervix (LGSIL): Secondary | ICD-10-CM | POA: Insufficient documentation

## 2024-08-28 DIAGNOSIS — Z7189 Other specified counseling: Secondary | ICD-10-CM

## 2024-08-28 DIAGNOSIS — C561 Malignant neoplasm of right ovary: Secondary | ICD-10-CM

## 2024-08-28 DIAGNOSIS — Z90721 Acquired absence of ovaries, unilateral: Secondary | ICD-10-CM | POA: Insufficient documentation

## 2024-08-28 DIAGNOSIS — Z9079 Acquired absence of other genital organ(s): Secondary | ICD-10-CM | POA: Insufficient documentation

## 2024-08-28 DIAGNOSIS — R109 Unspecified abdominal pain: Secondary | ICD-10-CM

## 2024-08-28 DIAGNOSIS — R3915 Urgency of urination: Secondary | ICD-10-CM

## 2024-08-28 DIAGNOSIS — B977 Papillomavirus as the cause of diseases classified elsewhere: Secondary | ICD-10-CM | POA: Insufficient documentation

## 2024-08-28 DIAGNOSIS — N9089 Other specified noninflammatory disorders of vulva and perineum: Secondary | ICD-10-CM

## 2024-08-28 LAB — CMP (CANCER CENTER ONLY)
ALT: 11 U/L (ref 0–44)
AST: 23 U/L (ref 15–41)
Albumin: 3.4 g/dL — ABNORMAL LOW (ref 3.5–5.0)
Alkaline Phosphatase: 73 U/L (ref 38–126)
Anion gap: 8 (ref 5–15)
BUN: 20 mg/dL (ref 8–23)
CO2: 27 mmol/L (ref 22–32)
Calcium: 9.5 mg/dL (ref 8.9–10.3)
Chloride: 103 mmol/L (ref 98–111)
Creatinine: 1 mg/dL (ref 0.44–1.00)
GFR, Estimated: 57 mL/min — ABNORMAL LOW
Glucose, Bld: 109 mg/dL — ABNORMAL HIGH (ref 70–99)
Potassium: 4.4 mmol/L (ref 3.5–5.1)
Sodium: 138 mmol/L (ref 135–145)
Total Bilirubin: 0.7 mg/dL (ref 0.0–1.2)
Total Protein: 7.3 g/dL (ref 6.5–8.1)

## 2024-08-28 LAB — CBC WITH DIFFERENTIAL (CANCER CENTER ONLY)
Abs Immature Granulocytes: 0.03 10*3/uL (ref 0.00–0.07)
Basophils Absolute: 0 10*3/uL (ref 0.0–0.1)
Basophils Relative: 0 %
Eosinophils Absolute: 0.2 10*3/uL (ref 0.0–0.5)
Eosinophils Relative: 4 %
HCT: 27.7 % — ABNORMAL LOW (ref 36.0–46.0)
Hemoglobin: 8.8 g/dL — ABNORMAL LOW (ref 12.0–15.0)
Immature Granulocytes: 1 %
Lymphocytes Relative: 24 %
Lymphs Abs: 1.2 10*3/uL (ref 0.7–4.0)
MCH: 29.3 pg (ref 26.0–34.0)
MCHC: 31.8 g/dL (ref 30.0–36.0)
MCV: 92.3 fL (ref 80.0–100.0)
Monocytes Absolute: 0.6 10*3/uL (ref 0.1–1.0)
Monocytes Relative: 11 %
Neutro Abs: 3.2 10*3/uL (ref 1.7–7.7)
Neutrophils Relative %: 60 %
Platelet Count: 347 10*3/uL (ref 150–400)
RBC: 3 MIL/uL — ABNORMAL LOW (ref 3.87–5.11)
RDW: 14.1 % (ref 11.5–15.5)
WBC Count: 5.2 10*3/uL (ref 4.0–10.5)
nRBC: 0 % (ref 0.0–0.2)

## 2024-08-28 NOTE — Assessment & Plan Note (Signed)
 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 The intent is of curative intent.  We discussed some of the risks, benefits, side-effects of carboplatin & Taxol. Treatment is intravenous, every 3 weeks x 6 cycles  Some of the short term side-effects included, though not limited to, including weight loss, life threatening infections, risk of allergic reactions, need for transfusions of blood products, nausea, vomiting, change in bowel habits, loss of hair, admission to hospital for various reasons, and risks of death.   Long term side-effects are also discussed including risks of infertility, permanent damage to nerve function, hearing loss, chronic fatigue, kidney damage with possibility needing hemodialysis, and rare secondary malignancy including bone marrow disorders. Patient education material was dispensed. We discussed premedication with dexamethasone  before chemotherapy.  She has good venous access and would not need port placement My major concern is her major comorbidities, frail status and lack of local caregiver Her next of kin is her granddaughter who lives in [place] I told her it is not feasible for her to drive back and forth for the patient to receive treatment here and without supervision for the rest of the time After long discussion,  her granddaughter is leaning towards moving the patient to live with her in [place] to complete adjuvant treatment I have not made return appointment for the patient to come back

## 2024-08-28 NOTE — Progress Notes (Signed)
 This encounter was created in error - please disregard.

## 2024-08-28 NOTE — Patient Instructions (Signed)
 We will plan for lab work today including a urine sample.   Plan on having a CT scan of the abdomen and pelvis with oral contrast. Nothing to eat or drink 4 hours before the scan and arrive two hours before to begin drinking oral contrast.   Dr. Eldonna would like to you to try drinking 1-2 boost supplemental shakes a day.   Dr. Eldonna would like for you to be taking miralax  instead of sennakot to prevent constipation.   We will order additional genetic/molecular tests on your tumor.   We will work on placing a referral to a systems developer in the [facility] area.

## 2024-08-28 NOTE — Assessment & Plan Note (Signed)
 Final pathology showed low grade squamous intraepithelial lesion  This fully resected She is having intermittent vaginal bleeding She will be examined by GYN surgeon today for further recommendation

## 2024-08-29 ENCOUNTER — Encounter (HOSPITAL_COMMUNITY): Payer: Self-pay

## 2024-08-29 LAB — URINALYSIS, COMPLETE (UACMP) WITH MICROSCOPIC
Bacteria, UA: NONE SEEN
Bilirubin Urine: NEGATIVE
Glucose, UA: NEGATIVE mg/dL
Ketones, ur: NEGATIVE mg/dL
Nitrite: NEGATIVE
Protein, ur: NEGATIVE mg/dL
Specific Gravity, Urine: 1.018 (ref 1.005–1.030)
pH: 5 (ref 5.0–8.0)

## 2024-08-29 LAB — URINE CULTURE: Culture: <10000 — AB

## 2024-08-31 ENCOUNTER — Encounter (HOSPITAL_COMMUNITY): Payer: Self-pay

## 2024-09-03 ENCOUNTER — Ambulatory Visit (HOSPITAL_COMMUNITY)

## 2024-09-06 ENCOUNTER — Inpatient Hospital Stay: Admitting: Hematology and Oncology

## 2024-10-01 ENCOUNTER — Inpatient Hospital Stay: Attending: Psychiatry
